# Patient Record
Sex: Male | Born: 1950
Health system: Southern US, Community
[De-identification: ages and names within clinical notes are randomized; demographics above are authoritative.]

## PROBLEM LIST (undated history)

## (undated) DIAGNOSIS — N189 Chronic kidney disease, unspecified: Secondary | ICD-10-CM

## (undated) DIAGNOSIS — E7800 Pure hypercholesterolemia, unspecified: Secondary | ICD-10-CM

## (undated) DIAGNOSIS — C801 Malignant (primary) neoplasm, unspecified: Secondary | ICD-10-CM

## (undated) HISTORY — PX: HEMORRHOID SURGERY: SHX153

## (undated) HISTORY — DX: Chronic kidney disease, unspecified: N18.9

## (undated) HISTORY — DX: Malignant (primary) neoplasm, unspecified: C80.1

---

## 2008-02-20 ENCOUNTER — Ambulatory Visit: Payer: Self-pay | Admitting: Internal Medicine

## 2008-03-05 ENCOUNTER — Ambulatory Visit: Payer: Self-pay | Admitting: Internal Medicine

## 2012-07-09 ENCOUNTER — Telehealth: Payer: Self-pay | Admitting: *Deleted

## 2012-07-09 ENCOUNTER — Other Ambulatory Visit: Payer: Self-pay | Admitting: Physician Assistant

## 2012-07-09 DIAGNOSIS — E785 Hyperlipidemia, unspecified: Secondary | ICD-10-CM

## 2012-07-09 MED ORDER — SIMVASTATIN 40 MG PO TABS: 40.0000 mg | ORAL_TABLET | Freq: Every day | ORAL | Status: DC

## 2012-07-09 NOTE — Telephone Encounter (Signed)
Simva, 1 year, [facility] - done

## 2012-07-09 NOTE — Telephone Encounter (Signed)
Patient was seen recently for a check-up.  He typically gets his Simvastatin for 1 year when he is seen, but this time he only received a 30 day supply with one refill.  Can you please give additional refills?  [facility]

## 2012-07-10 NOTE — Telephone Encounter (Signed)
Patient aware.

## 2012-11-27 ENCOUNTER — Other Ambulatory Visit: Payer: Self-pay | Admitting: Nurse Practitioner

## 2013-01-14 ENCOUNTER — Other Ambulatory Visit: Payer: Self-pay | Admitting: *Deleted

## 2013-01-14 DIAGNOSIS — E785 Hyperlipidemia, unspecified: Secondary | ICD-10-CM

## 2013-01-14 MED ORDER — SIMVASTATIN 40 MG PO TABS: 40.0000 mg | ORAL_TABLET | Freq: Every day | ORAL | Status: DC

## 2013-09-22 ENCOUNTER — Emergency Department (HOSPITAL_COMMUNITY)
Admission: EM | Admit: 2013-09-22 | Discharge: 2013-09-22 | Disposition: A | Discharge status: Home / Self Care | Payer: Worker's Compensation | Attending: Emergency Medicine | Admitting: Emergency Medicine

## 2013-09-22 ENCOUNTER — Encounter (HOSPITAL_COMMUNITY): Payer: Self-pay | Admitting: Emergency Medicine

## 2013-09-22 DIAGNOSIS — S62639B Displaced fracture of distal phalanx of unspecified finger, initial encounter for open fracture: Secondary | ICD-10-CM | POA: Insufficient documentation

## 2013-09-22 DIAGNOSIS — S61219A Laceration without foreign body of unspecified finger without damage to nail, initial encounter: Secondary | ICD-10-CM

## 2013-09-22 DIAGNOSIS — Y9289 Other specified places as the place of occurrence of the external cause: Secondary | ICD-10-CM | POA: Insufficient documentation

## 2013-09-22 DIAGNOSIS — W278XXA Contact with other nonpowered hand tool, initial encounter: Secondary | ICD-10-CM | POA: Insufficient documentation

## 2013-09-22 DIAGNOSIS — Y9389 Activity, other specified: Secondary | ICD-10-CM | POA: Insufficient documentation

## 2013-09-22 DIAGNOSIS — Z79899 Other long term (current) drug therapy: Secondary | ICD-10-CM | POA: Insufficient documentation

## 2013-09-22 DIAGNOSIS — S61209A Unspecified open wound of unspecified finger without damage to nail, initial encounter: Secondary | ICD-10-CM | POA: Insufficient documentation

## 2013-09-22 DIAGNOSIS — T148XXA Other injury of unspecified body region, initial encounter: Secondary | ICD-10-CM

## 2013-09-22 DIAGNOSIS — Y990 Civilian activity done for income or pay: Secondary | ICD-10-CM | POA: Insufficient documentation

## 2013-09-22 HISTORY — DX: Pure hypercholesterolemia, unspecified: E78.00

## 2013-09-22 MED ORDER — OXYCODONE-ACETAMINOPHEN 5-325 MG PO TABS: 1.0000 | ORAL_TABLET | Freq: Four times a day (QID) | ORAL | Status: DC | PRN

## 2013-09-22 MED ORDER — CEFAZOLIN SODIUM 1-5 GM-% IV SOLN
1.0000 g | Freq: Three times a day (TID) | INTRAVENOUS | Status: DC
  Administered 2013-09-22: 1 g via INTRAVENOUS
  Filled 2013-09-22: qty 50

## 2013-09-22 MED ORDER — ONDANSETRON HCL 4 MG PO TABS: 4.0000 mg | ORAL_TABLET | Freq: Four times a day (QID) | ORAL | Status: DC

## 2013-09-22 MED ORDER — MORPHINE SULFATE 4 MG/ML IJ SOLN
4.0000 mg | Freq: Once | INTRAMUSCULAR | Status: AC
  Administered 2013-09-22: 4 mg via INTRAVENOUS
  Filled 2013-09-22: qty 1

## 2013-09-22 MED ORDER — ONDANSETRON HCL 4 MG/2ML IJ SOLN
4.0000 mg | Freq: Once | INTRAMUSCULAR | Status: AC
  Administered 2013-09-22: 4 mg via INTRAVENOUS
  Filled 2013-09-22: qty 2

## 2013-09-22 MED ORDER — CEPHALEXIN 500 MG PO CAPS: ORAL_CAPSULE | ORAL | Status: DC

## 2013-09-22 NOTE — ED Provider Notes (Signed)
CSN: [id number]     Arrival date & time 09/22/13  1600 History  This chart was scribed for non-physician practitioner Cleatrice Burke, PA-C working with Virgel Manifold, MD by Eston Mould, ED Scribe. This patient was seen in room WTR7/WTR7 and the patient's care was started at 4:29 PM .   No chief complaint on file.  The history is provided by the patient. No language interpreter was used.   HPI Comments: Shawn Cline is a 63 y.o. male who presents to the Emergency Department complaining of L 2nd finger injury that occurred today,2 hours ago. Pt states while at work, he was using a table saw and had finger cut by saw. The injury occurred around 2PM. There is a burning pain. Was seen by PCP, had an X-ray done and was informed he had a laceration down to the bone. Reports taking 1 Asprin daily (last taken: last night); denies other blood thinner use. Last Tetanus shot:2-3 years ago.  No past medical history on file. No past surgical history on file. No family history on file. History  Substance Use Topics  . Smoking status: Not on file  . Smokeless tobacco: Not on file  . Alcohol Use: Not on file    Review of Systems  Skin: Positive for wound.  Neurological: Negative for weakness and numbness.  Hematological: Does not bruise/bleed easily.  All other systems reviewed and are negative.  Allergies  Review of patient's allergies indicates not on file.  Home Medications   Prior to Admission medications   Medication Sig Start Date End Date Taking? Authorizing Provider  simvastatin (ZOCOR) 40 MG tablet Take 1 tablet (40 mg total) by mouth at bedtime. 01/14/13   Chipper Herb, MD   BP 141/88  Pulse 78  Temp(Src) 98.6 F (37 C) (Oral)  Resp 16  SpO2 98%  Physical Exam  Nursing note and vitals reviewed. Constitutional: He is oriented to person, place, and time. He appears well-developed and well-nourished. No distress.  HENT:  Head: Normocephalic and atraumatic.  Right  Ear: External ear normal.  Left Ear: External ear normal.  Nose: Nose normal.  Eyes: Conjunctivae and EOM are normal.  Neck: Normal range of motion. Neck supple. No tracheal deviation present.  Cardiovascular: Normal rate, regular rhythm and normal heart sounds.   Pulmonary/Chest: Effort normal and breath sounds normal. No stridor. No respiratory distress.  Abdominal: Soft. He exhibits no distension. There is no tenderness.  Musculoskeletal: Normal range of motion.  Laceration to L distal 2nd finger. Nail plate involvement, nailbed intact. Compartment soft. Sensation is intact. Flexion and extension intact.   Neurological: He is alert and oriented to person, place, and time.  Sensation intact.  Skin: Skin is warm and dry. He is not diaphoretic.  Psychiatric: He has a normal mood and affect. His behavior is normal.   ED Course  Procedures DIAGNOSTIC STUDIES: Oxygen Saturation is 98% on RA, normal by my interpretation.    COORDINATION OF CARE: 4:33 PM-Discussed treatment plan which includes contact on-call hand surgeon, IV abx, and 2-mg of morphine. Pt agreed to plan.   4:50 PM-Spoke with Dr. Grandville Silos regarding patients case: will review X-rays and return phone call with a plan.  4: 86 PM - Dr. Grandville Silos states this fracture is too small to be fixed surgically and finger may be closed and tissue re approximated. F/u with him in the office.  Labs Review Labs Reviewed - No data to display  Imaging Review No results found.   EKG  Interpretation None     MDM   Final diagnoses:  Finger laceration  Avulsion fracture   Tdap booster UTD. Patient with an avulsion fracture to distal tip of 2nd finger on left. This is an open fracture. IV Ancef given in Ed. Patient will be discharged home with Keflex. Wound cleaning complete with pressure irrigation, bottom of wound visualized, no foreign bodies appreciated. Laceration occurred < 8 hours prior to repair which was well tolerated. Discussed  suture home care w pt and answered questions. Pt to f-u for wound check with Dr. Grandville Silos. Pt is hemodynamically stable w no complaints prior to dc. Dr. Wilson Singer evaluated patient and agrees with plan.   I personally performed the services described in this documentation, which was scribed in my presence. The recorded information has been reviewed and is accurate.    Elwyn Lade, PA-C 09/22/13 2020

## 2013-09-22 NOTE — ED Notes (Signed)
Pt seen at [facility] today for chainsaw accident at 39 today in which pt cut left index finger. PCP took xray, states pt cut into bone, sent CD of film. Pt states last tetanus shot was 3 years ago.

## 2013-09-22 NOTE — Discharge Instructions (Signed)
Finger Avulsion  When the tip of the finger is lost, a new nail may grow back if part of the fingernail is left. The new nail may be deformed. If just the tip of the finger is lost, no repair may be needed unless there is bone showing. If bone is showing, your caregiver may need to remove the protruding bone and put on a bandage. Your caregiver will do what is best for you. Most of the time when a fingertip is lost, the end will gradually grow back on and look fairly normal, but it may remain sensitive to pressure and temperature extremes for a long time. HOME CARE INSTRUCTIONS   Keep your hand elevated above your heart to relieve pain and swelling.  Keep your dressing dry and clean.  Change your bandage in 24 hours or as directed.  Only take over-the-counter or prescription medicines for pain, discomfort, or fever as directed by your caregiver.  See your caregiver as needed for problems. SEEK MEDICAL CARE IF:   You have increased pain, swelling, drainage, or bleeding.  You have a fever.  You have swelling that spreads from your finger and into your hand. Make sure to check to see if you need a tetanus booster. Document Released: 06/11/2001 Document Revised: 10/02/2011 Document Reviewed: 05/06/2008 [facility] Patient Information 2014 [place], [place].  Laceration Care, Adult A laceration is a cut or lesion that goes through all layers of the skin and into the tissue just beneath the skin. TREATMENT  Some lacerations may not require closure. Some lacerations may not be able to be closed due to an increased risk of infection. It is important to see your caregiver as soon as possible after an injury to minimize the risk of infection and maximize the opportunity for successful closure. If closure is appropriate, pain medicines may be given, if needed. The wound will be cleaned to help prevent infection. Your caregiver will use stitches (sutures), staples, wound glue (adhesive), or skin adhesive  strips to repair the laceration. These tools bring the skin edges together to allow for faster healing and a better cosmetic outcome. However, all wounds will heal with a scar. Once the wound has healed, scarring can be minimized by covering the wound with sunscreen during the day for 1 full year. HOME CARE INSTRUCTIONS  For sutures or staples:  Keep the wound clean and dry.  If you were given a bandage (dressing), you should change it at least once a day. Also, change the dressing if it becomes wet or dirty, or as directed by your caregiver.  Wash the wound with soap and water 2 times a day. Rinse the wound off with water to remove all soap. Pat the wound dry with a clean towel.  After cleaning, apply a thin layer of the antibiotic ointment as recommended by your caregiver. This will help prevent infection and keep the dressing from sticking.  You may shower as usual after the first 24 hours. Do not soak the wound in water until the sutures are removed.  Only take over-the-counter or prescription medicines for pain, discomfort, or fever as directed by your caregiver.  Get your sutures or staples removed as directed by your caregiver. For skin adhesive strips:  Keep the wound clean and dry.  Do not get the skin adhesive strips wet. You may bathe carefully, using caution to keep the wound dry.  If the wound gets wet, pat it dry with a clean towel.  Skin adhesive strips will fall off  on their own. You may trim the strips as the wound heals. Do not remove skin adhesive strips that are still stuck to the wound. They will fall off in time. For wound adhesive:  You may briefly wet your wound in the shower or bath. Do not soak or scrub the wound. Do not swim. Avoid periods of heavy perspiration until the skin adhesive has fallen off on its own. After showering or bathing, gently pat the wound dry with a clean towel.  Do not apply liquid medicine, cream medicine, or ointment medicine to your  wound while the skin adhesive is in place. This may loosen the film before your wound is healed.  If a dressing is placed over the wound, be careful not to apply tape directly over the skin adhesive. This may cause the adhesive to be pulled off before the wound is healed.  Avoid prolonged exposure to sunlight or tanning lamps while the skin adhesive is in place. Exposure to ultraviolet light in the first year will darken the scar.  The skin adhesive will usually remain in place for 5 to 10 days, then naturally fall off the skin. Do not pick at the adhesive film. You may need a tetanus shot if:  You cannot remember when you had your last tetanus shot.  You have never had a tetanus shot. If you get a tetanus shot, your arm may swell, get red, and feel warm to the touch. This is common and not a problem. If you need a tetanus shot and you choose not to have one, there is a rare chance of getting tetanus. Sickness from tetanus can be serious. SEEK MEDICAL CARE IF:   You have redness, swelling, or increasing pain in the wound.  You see a red line that goes away from the wound.  You have yellowish-white fluid (pus) coming from the wound.  You have a fever.  You notice a bad smell coming from the wound or dressing.  Your wound breaks open before or after sutures have been removed.  You notice something coming out of the wound such as wood or glass.  Your wound is on your hand or foot and you cannot move a finger or toe. SEEK IMMEDIATE MEDICAL CARE IF:   Your pain is not controlled with prescribed medicine.  You have severe swelling around the wound causing pain and numbness or a change in color in your arm, hand, leg, or foot.  Your wound splits open and starts bleeding.  You have worsening numbness, weakness, or loss of function of any joint around or beyond the wound.  You develop painful lumps near the wound or on the skin anywhere on your body. MAKE SURE YOU:   Understand  these instructions.  Will watch your condition.  Will get help right away if you are not doing well or get worse. Document Released: 04/02/2005 Document Revised: 06/25/2011 Document Reviewed: 09/26/2010 [facility] Patient Information 2014 [place], [place].

## 2013-09-22 NOTE — ED Notes (Signed)
Ortho called for finger splint.

## 2013-09-23 ENCOUNTER — Other Ambulatory Visit: Payer: Self-pay | Admitting: Family Medicine

## 2013-09-27 NOTE — ED Provider Notes (Signed)
Medical screening examination/treatment/procedure(s) were conducted as a shared visit with non-physician practitioner(s) and myself.  I personally evaluated the patient during the encounter.   EKG Interpretation None     63yM with L index laceration from table saw. Tuft fractures. Minimal soft tissue loss. Able to adequately close. Abx. PRN pain meds. Hand surgery FU.   Virgel Manifold, MD 09/27/13 [phone]

## 2013-11-23 ENCOUNTER — Encounter: Payer: Self-pay | Admitting: Internal Medicine

## 2013-12-02 ENCOUNTER — Encounter: Payer: Self-pay | Admitting: Family

## 2013-12-02 ENCOUNTER — Ambulatory Visit (INDEPENDENT_AMBULATORY_CARE_PROVIDER_SITE_OTHER): Payer: BC Managed Care – PPO | Admitting: Family

## 2013-12-02 VITALS — BP 127/81 | HR 79 | Temp 98.1°F | Ht 66.0 in | Wt 175.0 lb

## 2013-12-02 DIAGNOSIS — H6120 Impacted cerumen, unspecified ear: Secondary | ICD-10-CM

## 2013-12-02 DIAGNOSIS — H6123 Impacted cerumen, bilateral: Secondary | ICD-10-CM

## 2013-12-02 NOTE — Progress Notes (Signed)
   Subjective:    Patient ID: Shawn Cline, male    DOB: 1951/04/01, 63 y.o.   MRN: [id number]  Ear Fullness  There is pain in both ears. This is a recurrent problem. The current episode started yesterday. The problem occurs constantly. The problem has been gradually worsening. There has been no fever. The pain is at a severity of 2/10. The pain is mild. Associated symptoms include hearing loss. Pertinent negatives include no coughing, diarrhea, drainage, ear discharge, rhinorrhea or sore throat. He has tried nothing for the symptoms. The treatment provided no relief.      Review of Systems  Constitutional: Negative.   HENT: Positive for hearing loss. Negative for ear discharge, rhinorrhea and sore throat.   Respiratory: Negative.  Negative for cough.   Cardiovascular: Negative.   Gastrointestinal: Negative.  Negative for diarrhea.  Endocrine: Negative.   Genitourinary: Negative.   Musculoskeletal: Negative.   Neurological: Negative.   Hematological: Negative.   Psychiatric/Behavioral: Negative.   All other systems reviewed and are negative.      Objective:   Physical Exam  Vitals reviewed. Constitutional: He is oriented to person, place, and time. He appears well-developed and well-nourished. No distress.  HENT:  Head: Normocephalic.  Mouth/Throat: Oropharynx is clear and moist.  Bilateral ears impacted with cerumen   Eyes: Pupils are equal, round, and reactive to light. Right eye exhibits no discharge. Left eye exhibits no discharge.  Neck: Normal range of motion. Neck supple. No thyromegaly present.  Cardiovascular: Normal rate, regular rhythm, normal heart sounds and intact distal pulses.   No murmur heard. Pulmonary/Chest: Effort normal and breath sounds normal. No respiratory distress. He has no wheezes.  Abdominal: Soft. Bowel sounds are normal. He exhibits no distension. There is no tenderness.  Musculoskeletal: Normal range of motion. He exhibits no edema and no  tenderness.  Neurological: He is alert and oriented to person, place, and time. He has normal reflexes. No cranial nerve deficit.  Skin: Skin is warm and dry. No rash noted. No erythema.  Psychiatric: He has a normal mood and affect. His behavior is normal. Judgment and thought content normal.   Bilateral ears washed out- Canal mildly irritated and TM WNL   BP 127/81  Pulse 79  Temp(Src) 98.1 F (36.7 C) (Oral)  Ht 5\' 6"  (1.676 m)  Wt 175 lb (79.379 kg)  BMI 28.26 kg/m2     Assessment & Plan:  1. Cerumen impaction, bilateral -keep ears clean and dry -May need to use ear wax drop OTC to prevent build up -RTO prn  Evelina Dun, FNP

## 2013-12-02 NOTE — Patient Instructions (Signed)
Cerumen Impaction °A cerumen impaction is when the wax in your ear forms a plug. This plug usually causes reduced hearing. Sometimes it also causes an earache or dizziness. Removing a cerumen impaction can be difficult and painful. The wax sticks to the ear canal. The canal is sensitive and bleeds easily. If you try to remove a heavy wax buildup with a cotton tipped swab, you may push it in further. °Irrigation with water, suction, and small ear curettes may be used to clear out the wax. If the impaction is fixed to the skin in the ear canal, ear drops may be needed for a few days to loosen the wax. People who build up a lot of wax frequently can use ear wax removal products available in your local drugstore. °SEEK MEDICAL CARE IF:  °You develop an earache, increased hearing loss, or marked dizziness. °Document Released: 05/10/2004 Document Revised: 06/25/2011 Document Reviewed: 06/30/2009 °[employer]® Patient Information ©2015 [employer]. This information is not intended to replace advice given to you by your health care provider. Make sure you discuss any questions you have with your health care provider. ° °

## 2013-12-29 ENCOUNTER — Other Ambulatory Visit: Payer: Self-pay | Admitting: Nurse Practitioner

## 2014-03-05 ENCOUNTER — Telehealth: Payer: Self-pay | Admitting: Family Medicine

## 2014-03-06 NOTE — Telephone Encounter (Signed)
Appointment scheduled for 04/13/2014 with Ronnald Collum for physical

## 2014-04-12 ENCOUNTER — Other Ambulatory Visit: Payer: Self-pay | Admitting: Nurse Practitioner

## 2014-04-13 ENCOUNTER — Ambulatory Visit (INDEPENDENT_AMBULATORY_CARE_PROVIDER_SITE_OTHER): Payer: BC Managed Care – PPO | Admitting: Nurse Practitioner

## 2014-04-13 ENCOUNTER — Encounter: Payer: Self-pay | Admitting: Nurse Practitioner

## 2014-04-13 ENCOUNTER — Ambulatory Visit (INDEPENDENT_AMBULATORY_CARE_PROVIDER_SITE_OTHER): Payer: BC Managed Care – PPO

## 2014-04-13 VITALS — BP 124/82 | HR 70 | Temp 96.7°F | Ht 66.0 in | Wt 182.2 lb

## 2014-04-13 DIAGNOSIS — J209 Acute bronchitis, unspecified: Secondary | ICD-10-CM

## 2014-04-13 DIAGNOSIS — Z0000 Encounter for general adult medical examination without abnormal findings: Secondary | ICD-10-CM

## 2014-04-13 LAB — POCT CBC
Granulocyte percent: 62.7 %G (ref 37–80)
HCT, POC: 43.5 % (ref 43.5–53.7)
Hemoglobin: 13.9 g/dL — AB (ref 14.1–18.1)
Lymph, poc: 1.5 (ref 0.6–3.4)
MCH: 29.2 pg (ref 27–31.2)
MCHC: 31.8 g/dL (ref 31.8–35.4)
MCV: 91.6 fL (ref 80–97)
MPV: 7.7 fL (ref 0–99.8)
POC Granulocyte: 3.4 (ref 2–6.9)
POC LYMPH PERCENT: 27.2 %L (ref 10–50)
Platelet Count, POC: 149 10*3/uL (ref 142–424)
RBC: 4.8 M/uL (ref 4.69–6.13)
RDW, POC: 12.9 %
WBC: 5.5 10*3/uL (ref 4.6–10.2)

## 2014-04-13 MED ORDER — HYDROCODONE-HOMATROPINE 5-1.5 MG/5ML PO SYRP: 5.0000 mL | ORAL_SOLUTION | Freq: Three times a day (TID) | ORAL | Status: DC | PRN

## 2014-04-13 MED ORDER — AZITHROMYCIN 250 MG PO TABS: ORAL_TABLET | ORAL | Status: DC

## 2014-04-13 NOTE — Progress Notes (Signed)
Subjective:    Patient ID: Shawn Cline, male    DOB: Jan 13, 1951, 63 y.o.   MRN: [id number]  HPI Patient in today for annual physical exam- He has no current medical problems and is on no current meds- His only complaint today is clod like symptoms that started 3 days ago.    Review of Systems  Constitutional: Positive for fever. Negative for chills and appetite change.  HENT: Positive for congestion and rhinorrhea. Negative for sore throat, trouble swallowing and voice change.   Respiratory: Positive for cough.   Cardiovascular: Negative.   Gastrointestinal: Negative.   Genitourinary: Negative.   Neurological: Negative.  Negative for numbness.  Psychiatric/Behavioral: Negative.   All other systems reviewed and are negative.      Objective:   Physical Exam  Constitutional: He is oriented to person, place, and time. He appears well-developed and well-nourished.  HENT:  Head: Normocephalic.  Right Ear: External ear normal.  Left Ear: External ear normal.  Nose: Nose normal.  Mouth/Throat: Oropharynx is clear and moist.  Eyes: EOM are normal. Pupils are equal, round, and reactive to light.  Neck: Normal range of motion. Neck supple. No JVD present. No thyromegaly present.  Cardiovascular: Normal rate, regular rhythm, normal heart sounds and intact distal pulses.  Exam reveals no gallop and no friction rub.   No murmur heard. Pulmonary/Chest: Effort normal. No respiratory distress. He has no wheezes. He has no rales. He exhibits no tenderness.  Deep tight cough with ronchi in upper lobes  Abdominal: Soft. Bowel sounds are normal. He exhibits no mass. There is no tenderness.  Genitourinary: Prostate normal and penis normal.  Musculoskeletal: Normal range of motion. He exhibits no edema.  Lymphadenopathy:    He has no cervical adenopathy.  Neurological: He is alert and oriented to person, place, and time. No cranial nerve deficit.  Skin: Skin is warm and dry.  Psychiatric:  He has a normal mood and affect. His behavior is normal. Judgment and thought content normal.   BP 124/82 mmHg  Pulse 70  Temp(Src) 96.7 F (35.9 C) (Oral)  Ht 5\' 6"  (1.676 m)  Wt 182 lb 4 oz (82.668 kg)  BMI 29.43 kg/m2  EKG-NSR-Mary-Margaret Branton Einstein, FNP  Chest x ray- mild bronchitic changes- hiatal hernia-Preliminary reading by Ronnald Collum, FNP  [facility]      Assessment & Plan:   1. Annual physical exam   2. Acute bronchitis, unspecified organism    Meds ordered this encounter  Medications  . azithromycin (ZITHROMAX Z-PAK) 250 MG tablet    Sig: As directed    Dispense:  6 each    Refill:  0    Order Specific Question:  Supervising Provider    Answer:  Chipper Herb [[id number]]  . HYDROcodone-homatropine (HYCODAN) 5-1.5 MG/5ML syrup    Sig: Take 5 mLs by mouth every 8 (eight) hours as needed for cough.    Dispense:  120 mL    Refill:  0    Order Specific Question:  Supervising Provider    Answer:  Chipper Herb [[id number]]   1. Take meds as prescribed 2. Use a cool mist humidifier especially during the winter months and when heat has been humid. 3. Use saline nose sprays frequently 4. Saline irrigations of the nose can be very helpful if done frequently.  * 4X daily for 1 week*  * Use of a nettie pot can be helpful with this. Follow directions with this* 5. Drink plenty of fluids 6. Keep  thermostat turn down low 7.For any cough or congestion  Use plain Mucinex- regular strength or max strength is fine   * Children- consult with Pharmacist for dosing 8. For fever or aces or pains- take tylenol or ibuprofen appropriate for age and weight.  * for fevers greater than 101 orally you may alternate ibuprofen and tylenol every  3 hours.   Health maintenance reviewed RTO in 2 weeks for flu shot Labs pending Diet and exercise encouraged  Mary-Margaret Hassell Done, FNP

## 2014-04-13 NOTE — Patient Instructions (Signed)

## 2014-04-14 LAB — CMP14+EGFR
ALT: 16 IU/L (ref 0–44)
AST: 20 IU/L (ref 0–40)
Albumin/Globulin Ratio: 1.4 (ref 1.1–2.5)
Albumin: 4.2 g/dL (ref 3.6–4.8)
Alkaline Phosphatase: 60 IU/L (ref 39–117)
BUN/Creatinine Ratio: 13 (ref 10–22)
BUN: 18 mg/dL (ref 8–27)
CALCIUM: 8.7 mg/dL (ref 8.6–10.2)
CHLORIDE: 102 mmol/L (ref 97–108)
CO2: 24 mmol/L (ref 18–29)
Creatinine, Ser: 1.4 mg/dL — ABNORMAL HIGH (ref 0.76–1.27)
GFR calc Af Amer: 62 mL/min/{1.73_m2} (ref >59)
GFR, EST NON AFRICAN AMERICAN: 53 mL/min/{1.73_m2} — AB (ref >59)
Globulin, Total: 3.1 g/dL (ref 1.5–4.5)
Glucose: 106 mg/dL — ABNORMAL HIGH (ref 65–99)
POTASSIUM: 3.8 mmol/L (ref 3.5–5.2)
Sodium: 139 mmol/L (ref 134–144)
TOTAL PROTEIN: 7.3 g/dL (ref 6.0–8.5)
Total Bilirubin: 0.4 mg/dL (ref 0.0–1.2)

## 2014-04-14 LAB — PSA, TOTAL AND FREE
PSA, Free Pct: 44 %
PSA, Free: 0.22 ng/mL
PSA: 0.5 ng/mL (ref 0.0–4.0)

## 2014-04-14 LAB — NMR, LIPOPROFILE
Cholesterol: 172 mg/dL (ref 100–199)
HDL Cholesterol by NMR: 41 mg/dL (ref >39)
HDL Particle Number: 19.1 umol/L — ABNORMAL LOW (ref >=30.5)
LDL PARTICLE NUMBER: 1574 nmol/L — AB (ref <1000)
LDL SIZE: 21 nm (ref >20.5)
LDL-C: 112 mg/dL — ABNORMAL HIGH (ref 0–99)
LP-IR SCORE: 40 (ref <=45)
Small LDL Particle Number: 758 nmol/L — ABNORMAL HIGH (ref <=527)
Triglycerides by NMR: 93 mg/dL (ref 0–149)

## 2014-04-19 ENCOUNTER — Telehealth: Payer: Self-pay | Admitting: Nurse Practitioner

## 2014-04-19 DIAGNOSIS — E785 Hyperlipidemia, unspecified: Secondary | ICD-10-CM

## 2014-04-19 MED ORDER — SIMVASTATIN 40 MG PO TABS: 40.0000 mg | ORAL_TABLET | Freq: Every day | ORAL | Status: DC

## 2014-04-19 NOTE — Telephone Encounter (Signed)
Script for cholesterol medication wasn't filled at last appointment and he had been out for awhile.

## 2014-06-01 ENCOUNTER — Ambulatory Visit (INDEPENDENT_AMBULATORY_CARE_PROVIDER_SITE_OTHER): Payer: BLUE CROSS/BLUE SHIELD | Admitting: *Deleted

## 2014-06-01 DIAGNOSIS — Z23 Encounter for immunization: Secondary | ICD-10-CM

## 2014-06-17 ENCOUNTER — Ambulatory Visit: Payer: BLUE CROSS/BLUE SHIELD | Admitting: Nurse Practitioner

## 2014-10-29 ENCOUNTER — Telehealth: Payer: Self-pay | Admitting: Nurse Practitioner

## 2014-10-29 NOTE — Telephone Encounter (Signed)
Needs to be seen for labs before refilling cholesterol meds

## 2014-10-29 NOTE — Telephone Encounter (Signed)
appt scheduled Pt notified 

## 2014-11-03 ENCOUNTER — Ambulatory Visit (INDEPENDENT_AMBULATORY_CARE_PROVIDER_SITE_OTHER): Payer: BLUE CROSS/BLUE SHIELD | Admitting: Family Medicine

## 2014-11-03 ENCOUNTER — Other Ambulatory Visit: Payer: Self-pay | Admitting: *Deleted

## 2014-11-03 ENCOUNTER — Encounter: Payer: Self-pay | Admitting: Family Medicine

## 2014-11-03 VITALS — BP 135/86 | HR 76 | Temp 97.7°F | Ht 66.0 in | Wt 175.8 lb

## 2014-11-03 DIAGNOSIS — H6123 Impacted cerumen, bilateral: Secondary | ICD-10-CM | POA: Diagnosis not present

## 2014-11-03 DIAGNOSIS — E785 Hyperlipidemia, unspecified: Secondary | ICD-10-CM

## 2014-11-03 MED ORDER — SIMVASTATIN 40 MG PO TABS: 40.0000 mg | ORAL_TABLET | Freq: Every day | ORAL | Status: DC

## 2014-11-03 NOTE — Progress Notes (Signed)
   Subjective:  Patient ID: Shawn Cline, male    DOB: 1950-07-14  Age: 64 y.o. MRN: [id number]  CC: Ear Fullness   HPI Shawn Cline presents for 2-3 weeks of increasing pressure in ears. Has to have lavage about once a year because he makes a lot of wax and has small ear canals. HEaring is diminished currently, but able to communicate adequately.  Shawn Cline has a past medical Shawn of Hypercholesteremia.   He has past surgical Shawn that includes Hemorrhoid surgery.   His family Shawn includes Atrial fibrillation in his mother; Cancer in his father.He reports that he quit smoking about 22 years ago. He has never used smokeless tobacco. He reports that he does not drink alcohol or use illicit drugs.  Current Outpatient Prescriptions on File Prior to Visit  Medication Sig Dispense Refill  . aspirin EC 81 MG tablet Take 81 mg by mouth daily.    . cholecalciferol (VITAMIN D) 1000 UNITS tablet Take 1,000 Units by mouth daily.    Marland Kitchen ibuprofen (ADVIL,MOTRIN) 200 MG tablet Take 600 mg by mouth every 6 (six) hours as needed for moderate pain.    . simvastatin (ZOCOR) 40 MG tablet Take 1 tablet (40 mg total) by mouth at bedtime. 90 tablet 1   No current facility-administered medications on file prior to visit.    ROS Review of Systems  Constitutional: Negative for fever, chills, diaphoresis and activity change.  HENT: Positive for ear pain and hearing loss. Negative for congestion, ear discharge, facial swelling, nosebleeds, rhinorrhea, sinus pressure and sneezing.   Respiratory: Negative for apnea, cough and shortness of breath.   Cardiovascular: Negative.   Allergic/Immunologic: Negative.     Objective:  BP 135/86 mmHg  Pulse 76  Temp(Src) 97.7 F (36.5 C) (Oral)  Ht 5\' 6"  (1.676 m)  Wt 175 lb 12.8 oz (79.742 kg)  BMI 28.39 kg/m2  Physical Exam  Constitutional: He appears well-developed and well-nourished. No distress.  HENT:  Head: Normocephalic.  Bilateral  ear canals occluded by cerumen on first evaluation. Subsequently lavaged performed with no difficulty and the wax was removed completely revealing normal tympanum bilaterally as well as normal external ear canals.  Eyes: Conjunctivae are normal. Pupils are equal, round, and reactive to light.  Neck: Normal range of motion. Neck supple.  Cardiovascular: Normal rate and regular rhythm.   No murmur heard. Pulmonary/Chest: Effort normal and breath sounds normal.    Assessment & Plan:   Shawn Cline was seen today for ear fullness.  Diagnoses and all orders for this visit:  Cerumen impaction, bilateral Orders: -     Ear wax removal   I have discontinued Shawn Cline azithromycin and HYDROcodone-homatropine. I am also having him maintain his ibuprofen, aspirin EC, cholecalciferol, and simvastatin.  No orders of the defined types were placed in this encounter.     Follow-up: Return if symptoms worsen or fail to improve, for CPE.  Claretta Fraise, M.D.

## 2014-11-04 ENCOUNTER — Ambulatory Visit: Payer: BLUE CROSS/BLUE SHIELD | Admitting: Nurse Practitioner

## 2015-01-25 ENCOUNTER — Encounter: Payer: Self-pay | Admitting: Family Medicine

## 2015-01-25 ENCOUNTER — Ambulatory Visit (INDEPENDENT_AMBULATORY_CARE_PROVIDER_SITE_OTHER): Payer: BLUE CROSS/BLUE SHIELD | Admitting: Family Medicine

## 2015-01-25 VITALS — BP 133/70 | HR 73 | Temp 98.2°F | Ht 66.0 in | Wt 172.0 lb

## 2015-01-25 DIAGNOSIS — K122 Cellulitis and abscess of mouth: Secondary | ICD-10-CM | POA: Diagnosis not present

## 2015-01-25 MED ORDER — NYSTATIN 100000 UNIT/ML MT SUSP: OROMUCOSAL | Status: DC

## 2015-01-25 MED ORDER — NYSTATIN 100000 UNIT/ML MT SUSP: 5.0000 mL | Freq: Four times a day (QID) | OROMUCOSAL | Status: DC

## 2015-01-25 MED ORDER — AMOXICILLIN 500 MG PO CAPS: 500.0000 mg | ORAL_CAPSULE | Freq: Three times a day (TID) | ORAL | Status: DC

## 2015-01-25 MED ORDER — METHYLPREDNISOLONE ACETATE 80 MG/ML IJ SUSP
60.0000 mg | Freq: Once | INTRAMUSCULAR | Status: AC
  Administered 2015-01-25: 60 mg via INTRAMUSCULAR

## 2015-01-25 NOTE — Progress Notes (Signed)
Subjective:    Patient ID: Biff Rutigliano, male    DOB: 01/12/1951, 64 y.o.   MRN: [id number]  HPI  64 year old male comes in today with complaint that uvula is enlarged. This started about 2 days ago. Prior to this development he had a cough and sore throat that have since resolved. Cold sore throat were actually a couple of weeks ago. He takes ibuprofen for shoulder pain and has been taking this for years as well as his simvastatin. He doesn't recall anything in particular that happened on Sunday when this swelling started.    There are no active problems to display for this patient.  Outpatient Encounter Prescriptions as of 01/25/2015  Medication Sig  . aspirin EC 81 MG tablet Take 81 mg by mouth daily.  . cholecalciferol (VITAMIN D) 1000 UNITS tablet Take 1,000 Units by mouth daily.  Marland Kitchen ibuprofen (ADVIL,MOTRIN) 200 MG tablet Take 600 mg by mouth every 6 (six) hours as needed for moderate pain.  . simvastatin (ZOCOR) 40 MG tablet Take 1 tablet (40 mg total) by mouth at bedtime.   No facility-administered encounter medications on file as of 01/25/2015.       Review of Systems  Constitutional: Negative.   HENT:       Enlarged uvula  Eyes: Negative.   Respiratory: Positive for cough (Mild cough).   Cardiovascular: Negative.   Gastrointestinal: Negative.   Endocrine: Negative.   Genitourinary: Negative.   Musculoskeletal: Negative.   Skin: Negative.   Allergic/Immunologic: Negative.   Neurological: Negative.   Hematological: Negative.   Psychiatric/Behavioral: Negative.        Objective:   Physical Exam  Constitutional: He is oriented to person, place, and time. He appears well-developed and well-nourished. No distress.  HENT:  Head: Normocephalic and atraumatic.  Right Ear: External ear normal.  Left Ear: External ear normal.  Mouth/Throat: No oropharyngeal exudate.  There is some nasal congestion bilaterally. The uvula is elongated and erythematous with a white  linear streak on each side of it.  Eyes: Conjunctivae and EOM are normal. Pupils are equal, round, and reactive to light. Right eye exhibits no discharge. Left eye exhibits no discharge. No scleral icterus.  Neck: Normal range of motion. Neck supple. No thyromegaly present.  There is a small adenopathy but nontender to palpation  Cardiovascular: Normal rate, regular rhythm and normal heart sounds.   No murmur heard. Pulmonary/Chest: Effort normal and breath sounds normal. He has no wheezes. He has no rales.  Musculoskeletal: Normal range of motion.  Lymphadenopathy:    He has cervical adenopathy.  Neurological: He is alert and oriented to person, place, and time.  Skin: Skin is warm and dry. No rash noted.  Psychiatric: He has a normal mood and affect. His behavior is normal. Judgment and thought content normal.  Nursing note and vitals reviewed.   BP 133/70 mmHg  Pulse 73  Temp(Src) 98.2 F (36.8 C) (Oral)  Ht 5\' 6"  (1.676 m)  Wt 172 lb (78.019 kg)  BMI 27.77 kg/m2       Assessment & Plan:  1. Uvulitis - amoxicillin (AMOXIL) 500 MG capsule; Take 1 capsule (500 mg total) by mouth 3 (three) times daily.  Dispense: 30 capsule; Refill: 0 - methylPREDNISolone acetate (DEPO-MEDROL) injection 60 mg; Inject 0.75 mLs (60 mg total) into the muscle once. - nystatin (MYCOSTATIN) 100000 UNIT/ML suspension; Swish and swallow 1 teaspoon 4 times daily after meals and at bedtime  Dispense: 200 mL; Refill: 0  Patient Instructions  Swish and swallow 1 teaspoon Mycostatin suspension after meals and at bedtime Take antibiotic 3 times daily as directed Drink cold fluids If taking ibuprofen or Advil make sure that this is washed down with a large glass of water and after eating--- take as little as possible the next few days   Arrie Senate MD

## 2015-01-25 NOTE — Patient Instructions (Signed)
Swish and swallow 1 teaspoon Mycostatin suspension after meals and at bedtime Take antibiotic 3 times daily as directed Drink cold fluids If taking ibuprofen or Advil make sure that this is washed down with a large glass of water and after eating--- take as little as possible the next few days

## 2015-01-28 ENCOUNTER — Telehealth: Payer: Self-pay | Admitting: Family Medicine

## 2015-01-28 NOTE — Telephone Encounter (Signed)
Please have patient continue current treatment and let us recheck him again about Tuesday next week.

## 2015-01-28 NOTE — Telephone Encounter (Signed)
appt made for Tuesday  2:00 pm given

## 2015-02-01 ENCOUNTER — Ambulatory Visit (INDEPENDENT_AMBULATORY_CARE_PROVIDER_SITE_OTHER): Payer: BLUE CROSS/BLUE SHIELD | Admitting: Family Medicine

## 2015-02-01 ENCOUNTER — Encounter: Payer: Self-pay | Admitting: Family Medicine

## 2015-02-01 VITALS — BP 124/83 | HR 79 | Temp 97.2°F | Ht 66.0 in | Wt 173.0 lb

## 2015-02-01 DIAGNOSIS — E785 Hyperlipidemia, unspecified: Secondary | ICD-10-CM | POA: Diagnosis not present

## 2015-02-01 DIAGNOSIS — K122 Cellulitis and abscess of mouth: Secondary | ICD-10-CM | POA: Diagnosis not present

## 2015-02-01 DIAGNOSIS — E1169 Type 2 diabetes mellitus with other specified complication: Secondary | ICD-10-CM | POA: Insufficient documentation

## 2015-02-01 NOTE — Progress Notes (Signed)
   Subjective:    Patient ID: Shawn Cline, male    DOB: [birth year], 64 y.o.   MRN: [id number]  HPI Pt here for follow up of uvulitis. He is feeling better and is still taking the antibiotic. The patient is much improved with his uvula and the swelling that was occurring with this. He still has a few more antibiotics to take. He has finished the Mycostatin.      There are no active problems to display for this patient.  Outpatient Encounter Prescriptions as of 02/01/2015  Medication Sig  . amoxicillin (AMOXIL) 500 MG capsule Take 1 capsule (500 mg total) by mouth 3 (three) times daily.  Marland Kitchen aspirin EC 81 MG tablet Take 81 mg by mouth daily.  . cholecalciferol (VITAMIN D) 1000 UNITS tablet Take 1,000 Units by mouth daily.  Marland Kitchen ibuprofen (ADVIL,MOTRIN) 200 MG tablet Take 600 mg by mouth every 6 (six) hours as needed for moderate pain.  Marland Kitchen nystatin (MYCOSTATIN) 100000 UNIT/ML suspension Swish and swallow 1 teaspoon 4 times daily after meals and at bedtime  . simvastatin (ZOCOR) 40 MG tablet Take 1 tablet (40 mg total) by mouth at bedtime.   No facility-administered encounter medications on file as of 02/01/2015.      Review of Systems  Constitutional: Negative.   HENT: Negative.   Eyes: Negative.   Respiratory: Positive for cough.   Cardiovascular: Negative.   Gastrointestinal: Negative.   Endocrine: Negative.   Genitourinary: Negative.   Musculoskeletal: Negative.   Skin: Negative.   Allergic/Immunologic: Negative.   Neurological: Negative.   Hematological: Negative.   Psychiatric/Behavioral: Negative.        Objective:   Physical Exam  Constitutional: He is oriented to person, place, and time. He appears well-developed and well-nourished. No distress.  HENT:  Head: Normocephalic and atraumatic.  Mouth/Throat: Oropharynx is clear and moist. No oropharyngeal exudate.  The uvula is much less swollen and actually appears normal today.  Eyes: Conjunctivae and EOM are normal.  Pupils are equal, round, and reactive to light. Right eye exhibits no discharge. Left eye exhibits no discharge.  Neck: Normal range of motion. Neck supple. No thyromegaly present.  Musculoskeletal: Normal range of motion.  Neurological: He is alert and oriented to person, place, and time.  Skin: Skin is warm and dry. No rash noted.  Psychiatric: He has a normal mood and affect. His behavior is normal. Judgment and thought content normal.  Nursing note and vitals reviewed.  BP 124/83 mmHg  Pulse 79  Temp(Src) 97.2 F (36.2 C) (Oral)  Ht 5\' 6"  (1.676 m)  Wt 173 lb (78.472 kg)  BMI 27.94 kg/m2        Assessment & Plan:  1. Hyperlipidemia -Continue current treatment and follow-up with provider for labwork as planned  2. Uvulitis -Finish antibiotic -Return to clinic for fluid in one week  Patient Instructions  Wait 1 week and return for flu shot Finish antibiotic If any further problems with swelling of the uvula please get back in touch with [place]   Arrie Senate MD

## 2015-02-01 NOTE — Patient Instructions (Signed)
Wait 1 week and return for flu shot Finish antibiotic If any further problems with swelling of the uvula please get back in touch with [place]

## 2015-02-11 ENCOUNTER — Ambulatory Visit (INDEPENDENT_AMBULATORY_CARE_PROVIDER_SITE_OTHER): Payer: BLUE CROSS/BLUE SHIELD

## 2015-02-11 DIAGNOSIS — Z23 Encounter for immunization: Secondary | ICD-10-CM | POA: Diagnosis not present

## 2015-04-28 ENCOUNTER — Ambulatory Visit (INDEPENDENT_AMBULATORY_CARE_PROVIDER_SITE_OTHER): Payer: BLUE CROSS/BLUE SHIELD | Admitting: Family Medicine

## 2015-04-28 ENCOUNTER — Encounter: Payer: Self-pay | Admitting: Family Medicine

## 2015-04-28 VITALS — BP 125/83 | HR 82 | Temp 97.8°F | Ht 66.0 in | Wt 177.8 lb

## 2015-04-28 DIAGNOSIS — M25512 Pain in left shoulder: Principal | ICD-10-CM

## 2015-04-28 DIAGNOSIS — E785 Hyperlipidemia, unspecified: Secondary | ICD-10-CM

## 2015-04-28 DIAGNOSIS — M25519 Pain in unspecified shoulder: Secondary | ICD-10-CM | POA: Insufficient documentation

## 2015-04-28 DIAGNOSIS — M25511 Pain in right shoulder: Secondary | ICD-10-CM

## 2015-04-28 DIAGNOSIS — Z0000 Encounter for general adult medical examination without abnormal findings: Secondary | ICD-10-CM | POA: Insufficient documentation

## 2015-04-28 LAB — POCT GLYCOSYLATED HEMOGLOBIN (HGB A1C): Hemoglobin A1C: 5.5

## 2015-04-28 MED ORDER — TRAMADOL HCL 50 MG PO TABS: 50.0000 mg | ORAL_TABLET | Freq: Two times a day (BID) | ORAL | Status: DC | PRN

## 2015-04-28 NOTE — Patient Instructions (Signed)
Great to meet you!  We will call with results within 1 week

## 2015-04-28 NOTE — Progress Notes (Signed)
   HPI  Patient presents today here for physical exam.  Patient has no complaints except for bilateral shoulder pain.  Explained that he's had several years of bilateral shoulder pain that keeps him awake at night. Is no discrete injury. 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 He has pain with his bilateral shoulders when sleeping on his stomach, he is unable to sleep on his back. He has minimal results with 800 mg of Motrin.  Denies dyspnea, chest pain, palpitations, leg edema.  He works at [employer]   PMH: Smoking status noted ROS: Per HPI  Objective: BP 125/83 mmHg  Pulse 82  Temp(Src) 97.8 F (36.6 C) (Oral)  Ht '5\' 6"'$  (1.676 m)  Wt 177 lb 12.8 oz (80.65 kg)  BMI 28.71 kg/m2 Gen: NAD, alert, cooperative with exam HEENT: NCAT, PERRLA, TMs normal on the left, right partially obscured by cerumen, oropharynx clear, nares clear CV: RRR, good S1/S2, no murmur Resp: CTABL, no wheezes, non-labored Abd: SNTND, BS present, no guarding or organomegaly Ext: No edema, warm Neuro: Alert and oriented, No gross deficits Musculoskeletal: Bilateral shoulder pain with empty can test, left-sided pain with Hawkins test Full range of motion, slightly limited on the right with Apley scratch test.   Assessment and plan:  # Annual physical exam Normal Nonfasting labs today PSA per request  # Bilateral shoulder pain Previously well controlled with Tylenol No. 3, will have a trial tramadol. Discuss possible x-rays and injections.  HLD Lipid panel SImvastatin   Orders Placed This Encounter  Procedures  . CBC  . CMP14+EGFR  . Lipid Panel  . VITAMIN D 25 Hydroxy (Vit-D Deficiency, Fractures)  . PSA  . POCT glycosylated hemoglobin (Hb A1C)    Meds ordered this encounter  Medications  . traMADol (ULTRAM) 50 MG tablet    Sig: Take 1-2 tablets (50-100 mg total) by mouth every 12  (twelve) hours as needed.    Dispense:  60 tablet    Refill:  [place], MD [facility] 04/28/2015, 2:54 PM

## 2015-04-29 LAB — CMP14+EGFR
ALK PHOS: 57 IU/L (ref 39–117)
ALT: 23 IU/L (ref 0–44)
AST: 21 IU/L (ref 0–40)
Albumin/Globulin Ratio: 1.4 (ref 1.1–2.5)
Albumin: 4.4 g/dL (ref 3.6–4.8)
BILIRUBIN TOTAL: 0.4 mg/dL (ref 0.0–1.2)
BUN / CREAT RATIO: 17 (ref 10–22)
BUN: 22 mg/dL (ref 8–27)
CHLORIDE: 99 mmol/L (ref 96–106)
CO2: 27 mmol/L (ref 18–29)
Calcium: 9.2 mg/dL (ref 8.6–10.2)
Creatinine, Ser: 1.3 mg/dL — ABNORMAL HIGH (ref 0.76–1.27)
GFR calc Af Amer: 67 mL/min/{1.73_m2} (ref >59)
GFR calc non Af Amer: 58 mL/min/{1.73_m2} — ABNORMAL LOW (ref >59)
GLOBULIN, TOTAL: 3.1 g/dL (ref 1.5–4.5)
GLUCOSE: 97 mg/dL (ref 65–99)
Potassium: 4.2 mmol/L (ref 3.5–5.2)
SODIUM: 139 mmol/L (ref 134–144)
Total Protein: 7.5 g/dL (ref 6.0–8.5)

## 2015-04-29 LAB — CBC
Hematocrit: 40.3 % (ref 37.5–51.0)
Hemoglobin: 13.9 g/dL (ref 12.6–17.7)
MCH: 31.6 pg (ref 26.6–33.0)
MCHC: 34.5 g/dL (ref 31.5–35.7)
MCV: 92 fL (ref 79–97)
Platelets: 138 10*3/uL — ABNORMAL LOW (ref 150–379)
RBC: 4.4 x10E6/uL (ref 4.14–5.80)
RDW: 13.2 % (ref 12.3–15.4)
WBC: 5.2 10*3/uL (ref 3.4–10.8)

## 2015-04-29 LAB — LIPID PANEL
CHOLESTEROL TOTAL: 117 mg/dL (ref 100–199)
Chol/HDL Ratio: 3.1 ratio units (ref 0.0–5.0)
HDL: 38 mg/dL — ABNORMAL LOW (ref >39)
LDL Calculated: 61 mg/dL (ref 0–99)
Triglycerides: 91 mg/dL (ref 0–149)
VLDL Cholesterol Cal: 18 mg/dL (ref 5–40)

## 2015-04-29 LAB — PSA: Prostate Specific Ag, Serum: 0.4 ng/mL (ref 0.0–4.0)

## 2015-04-29 LAB — VITAMIN D 25 HYDROXY (VIT D DEFICIENCY, FRACTURES): Vit D, 25-Hydroxy: 40.6 ng/mL (ref 30.0–100.0)

## 2015-05-02 ENCOUNTER — Telehealth: Payer: Self-pay | Admitting: Family Medicine

## 2015-05-02 DIAGNOSIS — E785 Hyperlipidemia, unspecified: Secondary | ICD-10-CM

## 2015-05-02 NOTE — Progress Notes (Signed)
Patient aware.

## 2015-05-05 MED ORDER — SIMVASTATIN 40 MG PO TABS
40.0000 mg | ORAL_TABLET | Freq: Every day | ORAL | Status: DC
Start: 2015-05-05 — End: 2015-08-05

## 2015-05-05 NOTE — Telephone Encounter (Signed)
Patient aware.

## 2015-05-05 NOTE — Telephone Encounter (Signed)
Sent in [place], MD [facility] 05/05/2015, 10:28 AM

## 2015-06-01 ENCOUNTER — Encounter: Payer: Self-pay | Admitting: Family Medicine

## 2015-06-01 ENCOUNTER — Ambulatory Visit (INDEPENDENT_AMBULATORY_CARE_PROVIDER_SITE_OTHER): Payer: BLUE CROSS/BLUE SHIELD | Admitting: Family Medicine

## 2015-06-01 ENCOUNTER — Ambulatory Visit (INDEPENDENT_AMBULATORY_CARE_PROVIDER_SITE_OTHER): Payer: BLUE CROSS/BLUE SHIELD

## 2015-06-01 VITALS — BP 130/77 | HR 73 | Temp 97.7°F | Ht 66.0 in | Wt 179.4 lb

## 2015-06-01 DIAGNOSIS — M25571 Pain in right ankle and joints of right foot: Secondary | ICD-10-CM

## 2015-06-01 MED ORDER — DICLOFENAC SODIUM 75 MG PO TBEC: 75.0000 mg | DELAYED_RELEASE_TABLET | Freq: Two times a day (BID) | ORAL | Status: DC

## 2015-06-01 NOTE — Progress Notes (Signed)
Subjective:  Patient ID: Shawn Cline, male    DOB: 03-04-1951  Age: 65 y.o. MRN: [id number]  CC: Foot Pain   HPI Shawn Cline presents for 10 days of increasing pain at right foot. Severe with weight bearing especially bending otes to ambulate. NKI Pain is mild at rest. Described as sharp. No previous episodes   History Shawn Cline has a past medical history of Hypercholesteremia.   Shawn Cline has past surgical history that includes Hemorrhoid surgery.   His family history includes Atrial fibrillation in his mother; Cancer in his father.Shawn Cline reports that Shawn Cline quit smoking about 23 years ago. Shawn Cline has never used smokeless tobacco. Shawn Cline reports that Shawn Cline does not drink alcohol or use illicit drugs.    ROS Review of Systems  Constitutional: Positive for activity change. Negative for fever, chills, diaphoresis and appetite change.  HENT: Negative.   Respiratory: Negative.   Cardiovascular: Negative for chest pain.  Musculoskeletal: Positive for myalgias, joint swelling and arthralgias.  Skin: Negative for rash.  Neurological: Negative for weakness.    Objective:  BP 130/77 mmHg  Pulse 73  Temp(Src) 97.7 F (36.5 C) (Oral)  Ht 5\' 6"  (1.676 m)  Wt 179 lb 6.4 oz (81.375 kg)  BMI 28.97 kg/m2  SpO2 99%  BP Readings from Last 3 Encounters:  06/01/15 130/77  04/28/15 125/83  02/01/15 124/83    Wt Readings from Last 3 Encounters:  06/01/15 179 lb 6.4 oz (81.375 kg)  04/28/15 177 lb 12.8 oz (80.65 kg)  02/01/15 173 lb (78.472 kg)     Physical Exam  Constitutional: Shawn Cline is oriented to person, place, and time. Shawn Cline appears well-developed and well-nourished.  HENT:  Head: Normocephalic and atraumatic.  Right Ear: External ear normal.  Left Ear: External ear normal.  Mouth/Throat: No oropharyngeal exudate or posterior oropharyngeal erythema.  Eyes: Pupils are equal, round, and reactive to light.  Neck: Normal range of motion. Neck supple.  Cardiovascular: Normal rate and regular rhythm.     No murmur heard. Pulmonary/Chest: Breath sounds normal. No respiratory distress.  Abdominal: Bowel sounds are normal.  Musculoskeletal: Normal range of motion. Shawn Cline exhibits edema and tenderness (2+ edema with mild erythema at MTP 2-4 on right. Nontender for passive ROM, but limp noted ).  Neurological: Shawn Cline is alert and oriented to person, place, and time.  Skin: Skin is warm and dry.  Vitals reviewed.    Lab Results  Component Value Date   WBC 5.2 04/28/2015   HGB 13.9* 04/13/2014   HCT 40.3 04/28/2015   PLT 138* 04/28/2015   GLUCOSE 97 04/28/2015   CHOL 117 04/28/2015   TRIG 91 04/28/2015   HDL 38* 04/28/2015   LDLCALC 61 04/28/2015   ALT 23 04/28/2015   AST 21 04/28/2015   NA 139 04/28/2015   K 4.2 04/28/2015   CL 99 04/28/2015   CREATININE 1.30* 04/28/2015   BUN 22 04/28/2015   CO2 27 04/28/2015   PSA 0.5 04/13/2014   HGBA1C 5.5 04/28/2015    No results found.  Assessment & Plan:   Shawn Cline was seen today for foot pain.  Diagnoses and all orders for this visit:  Pain in joint, ankle and foot, right -     DG Foot Complete Right; Future -     CBC with Differential/Platelet -     Uric acid -     Post-op shoe  Other orders -     diclofenac (VOLTAREN) 75 MG EC tablet; Take 1 tablet (75 mg total) by  mouth 2 (two) times daily.      I am having Shawn Cline start on diclofenac. I am also having him maintain his ibuprofen, aspirin EC, cholecalciferol, traMADol, and simvastatin.  Meds ordered this encounter  Medications  . diclofenac (VOLTAREN) 75 MG EC tablet    Sig: Take 1 tablet (75 mg total) by mouth 2 (two) times daily.    Dispense:  60 tablet    Refill:  2     Follow-up: Return if symptoms worsen or fail to improve.  Claretta Fraise, M.D.

## 2015-06-02 LAB — CBC WITH DIFFERENTIAL/PLATELET
Basophils Absolute: 0 10*3/uL (ref 0.0–0.2)
Basos: 1 %
EOS (ABSOLUTE): 0.2 10*3/uL (ref 0.0–0.4)
EOS: 3 %
Hematocrit: 38.9 % (ref 37.5–51.0)
Hemoglobin: 13.5 g/dL (ref 12.6–17.7)
IMMATURE GRANULOCYTES: 0 %
Immature Grans (Abs): 0 10*3/uL (ref 0.0–0.1)
LYMPHS ABS: 1.4 10*3/uL (ref 0.7–3.1)
Lymphs: 29 %
MCH: 30.7 pg (ref 26.6–33.0)
MCHC: 34.7 g/dL (ref 31.5–35.7)
MCV: 88 fL (ref 79–97)
Monocytes Absolute: 0.6 10*3/uL (ref 0.1–0.9)
Monocytes: 13 %
NEUTROS PCT: 54 %
Neutrophils Absolute: 2.7 10*3/uL (ref 1.4–7.0)
PLATELETS: 156 10*3/uL (ref 150–379)
RBC: 4.4 x10E6/uL (ref 4.14–5.80)
RDW: 13.2 % (ref 12.3–15.4)
WBC: 4.9 10*3/uL (ref 3.4–10.8)

## 2015-06-02 LAB — URIC ACID: Uric Acid: 4.5 mg/dL (ref 3.7–8.6)

## 2015-08-05 ENCOUNTER — Encounter: Payer: Self-pay | Admitting: Family Medicine

## 2015-08-05 ENCOUNTER — Ambulatory Visit (INDEPENDENT_AMBULATORY_CARE_PROVIDER_SITE_OTHER): Payer: BLUE CROSS/BLUE SHIELD | Admitting: Family Medicine

## 2015-08-05 VITALS — BP 128/82 | HR 75 | Temp 98.0°F | Ht 66.0 in | Wt 181.0 lb

## 2015-08-05 DIAGNOSIS — M25511 Pain in right shoulder: Secondary | ICD-10-CM | POA: Diagnosis not present

## 2015-08-05 DIAGNOSIS — H6123 Impacted cerumen, bilateral: Secondary | ICD-10-CM

## 2015-08-05 DIAGNOSIS — E785 Hyperlipidemia, unspecified: Secondary | ICD-10-CM | POA: Diagnosis not present

## 2015-08-05 DIAGNOSIS — N183 Chronic kidney disease, stage 3 unspecified: Secondary | ICD-10-CM | POA: Insufficient documentation

## 2015-08-05 DIAGNOSIS — M25512 Pain in left shoulder: Secondary | ICD-10-CM | POA: Diagnosis not present

## 2015-08-05 MED ORDER — TRAMADOL HCL 50 MG PO TABS: 50.0000 mg | ORAL_TABLET | Freq: Two times a day (BID) | ORAL | Status: DC | PRN

## 2015-08-05 MED ORDER — VITAMIN D3 25 MCG (1000 UNIT) PO TABS: 1000.0000 [IU] | ORAL_TABLET | Freq: Every day | ORAL | Status: AC

## 2015-08-05 MED ORDER — DICLOFENAC SODIUM 75 MG PO TBEC: 75.0000 mg | DELAYED_RELEASE_TABLET | Freq: Two times a day (BID) | ORAL | Status: DC

## 2015-08-05 MED ORDER — SIMVASTATIN 40 MG PO TABS: 40.0000 mg | ORAL_TABLET | Freq: Every day | ORAL | Status: DC

## 2015-08-05 NOTE — Progress Notes (Signed)
   HPI  Patient presents today here for follow-up.  Patient explains that with the combination of tramadol and diclofenac he has practically no shoulder pain. He understands that NSAIDs are hard on his kidneys and agrees to decrease the dose to 1 pill once a day and try to decrease the dose from there if possible.  needs refills, 3 month supply if possible.  PMH: Smoking status noted ROS: Per HPI  Objective: BP 128/82 mmHg  Pulse 75  Temp(Src) 98 F (36.7 C) (Oral)  Ht 5\' 6"  (1.676 m)  Wt 181 lb (82.101 kg)  BMI 29.23 kg/m2 Gen: NAD, alert, cooperative with exam HEENT: NCAT, TMs obscured by cerumen bilaterally CV: RRR, good S1/S2, no murmur Resp: CTABL, no wheezes, non-labored Ext: No edema, warm Neuro: Alert and oriented, No gross deficits  Assessment and plan:  # Shoulder pain Improved greatly with tramadol and NSAIDs He understands the renal stress involved with NSAIDs and agrees to decrease the dose of diclofenac Refilled both  # Chronic kidney disease stage III Discussed limiting NSAIDs, plenty fluids At least annual labs  # Hyperlipidemia Tolerating simvastatin well Refilled  # Impacted cerumen Washed out in clinic     Meds ordered this encounter  Medications  . cholecalciferol (VITAMIN D) 1000 units tablet    Sig: Take 1 tablet (1,000 Units total) by mouth daily.    Dispense:  90 tablet    Refill:  3  . traMADol (ULTRAM) 50 MG tablet    Sig: Take 1-2 tablets (50-100 mg total) by mouth every 12 (twelve) hours as needed.    Dispense:  90 tablet    Refill:  3  . simvastatin (ZOCOR) 40 MG tablet    Sig: Take 1 tablet (40 mg total) by mouth at bedtime.    Dispense:  90 tablet    Refill:  3  . diclofenac (VOLTAREN) 75 MG EC tablet    Sig: Take 1 tablet (75 mg total) by mouth 2 (two) times daily.    Dispense:  180 tablet    Refill:  0    Laroy Apple, MD [facility] 08/05/2015, 4:32 PM

## 2015-08-05 NOTE — Patient Instructions (Signed)
Great to see you!  I have sent 1 year refill on everything but Voltaren, try to decrease the dose  I would recommend re-check of your kidney function in 6 months

## 2016-02-10 ENCOUNTER — Ambulatory Visit: Payer: BLUE CROSS/BLUE SHIELD | Admitting: Family Medicine

## 2016-02-17 ENCOUNTER — Ambulatory Visit (INDEPENDENT_AMBULATORY_CARE_PROVIDER_SITE_OTHER): Payer: BLUE CROSS/BLUE SHIELD | Admitting: Family Medicine

## 2016-02-17 ENCOUNTER — Encounter: Payer: Self-pay | Admitting: Family Medicine

## 2016-02-17 VITALS — BP 138/83 | HR 74 | Temp 97.5°F | Ht 66.0 in | Wt 176.4 lb

## 2016-02-17 DIAGNOSIS — Z0000 Encounter for general adult medical examination without abnormal findings: Secondary | ICD-10-CM

## 2016-02-17 DIAGNOSIS — L719 Rosacea, unspecified: Secondary | ICD-10-CM

## 2016-02-17 DIAGNOSIS — M25512 Pain in left shoulder: Principal | ICD-10-CM

## 2016-02-17 DIAGNOSIS — H6123 Impacted cerumen, bilateral: Secondary | ICD-10-CM

## 2016-02-17 DIAGNOSIS — Z23 Encounter for immunization: Secondary | ICD-10-CM

## 2016-02-17 DIAGNOSIS — M25511 Pain in right shoulder: Secondary | ICD-10-CM

## 2016-02-17 DIAGNOSIS — N183 Chronic kidney disease, stage 3 unspecified: Secondary | ICD-10-CM

## 2016-02-17 DIAGNOSIS — Z125 Encounter for screening for malignant neoplasm of prostate: Secondary | ICD-10-CM

## 2016-02-17 DIAGNOSIS — E785 Hyperlipidemia, unspecified: Secondary | ICD-10-CM

## 2016-02-17 MED ORDER — DICLOFENAC SODIUM 75 MG PO TBEC: 75.0000 mg | DELAYED_RELEASE_TABLET | Freq: Every day | ORAL | 1 refills | Status: DC

## 2016-02-17 MED ORDER — METRONIDAZOLE 1 % EX GEL: Freq: Every day | CUTANEOUS | 3 refills | Status: DC

## 2016-02-17 NOTE — Patient Instructions (Signed)
Great to see you!  Come back in 6 months unless you need [place] sooner.   We iwll call with lab results within 1 week.

## 2016-02-17 NOTE — Progress Notes (Signed)
   HPI  Patient presents today here for routine follow-up of shoulder pain, hyperlipidemia, and annual physical exam.  Annual physical exam Active at work, no formal exercise Once a flu shot today.  Impacted cerumen Dull hearing for a few months, he needs routine ear irrigation.  Shoulder pain Helped by diclofenac at night, he's taking this only at night and understands that it may be causing renal stress. He would like to recheck his renal function as well. Tramadol for daytime pain, does not cause any dizziness, states "it's like taking an aspirin"  PMH: Smoking status noted ROS: Per HPI  Objective: BP 138/83   Pulse 74   Temp 97.5 F (36.4 C) (Oral)   Ht _0  (1.676 m)   Wt 176 lb 6.4 oz (80 kg)   BMI 28.47 kg/m  Gen: NAD, alert, cooperative with exam HEENT: NCAT, TMs obscured by cerumen bilaterally, after ear irrigation and curetting his cerumen is completely removed CV: RRR, good S1/S2, no murmur Resp: CTABL, no wheezes, non-labored Abd: SNTND, BS present, no guarding or organomegaly Ext: No edema, warm Neuro: Alert and oriented, No gross deficits  Assessment and plan:  # Physical exam Normal exam Flu shot today PSA per Request- screening for prostate cancer  # Shoulder pain Controlled with diclofenac at night and tramadol during the day He is using tramadol sparingly, does not need a refill. refilled diclofenac  # Rosacea Mild, worsening without meds Treat with MetroGel  # Acute kidney disease stage III Repeat labs, monitor closely with chronic NSAID use  Hyperlipidemia Repeating LDL, nonfasting Continue simvastatin  Fluids a vaccine given today- counseling provided for all components  Orders Placed This Encounter  Procedures  . CMP14+EGFR  . LDL Cholesterol, Direct  . PSA  . CBC with Differential    Meds ordered this encounter  Medications  . diclofenac (VOLTAREN) 75 MG EC tablet    Sig: Take 1 tablet (75 mg total) by mouth at bedtime.      Dispense:  90 tablet    Refill:  [place], MD [facility] 02/17/2016, 4:42 PM

## 2016-02-18 LAB — CMP14+EGFR
ALBUMIN: 4.3 g/dL (ref 3.6–4.8)
ALT: 26 IU/L (ref 0–44)
AST: 20 IU/L (ref 0–40)
Albumin/Globulin Ratio: 1.2 (ref 1.2–2.2)
Alkaline Phosphatase: 60 IU/L (ref 39–117)
BILIRUBIN TOTAL: 0.4 mg/dL (ref 0.0–1.2)
BUN / CREAT RATIO: 13 (ref 10–24)
BUN: 21 mg/dL (ref 8–27)
CHLORIDE: 99 mmol/L (ref 96–106)
CO2: 27 mmol/L (ref 18–29)
Calcium: 9.5 mg/dL (ref 8.6–10.2)
Creatinine, Ser: 1.68 mg/dL — ABNORMAL HIGH (ref 0.76–1.27)
GFR calc Af Amer: 49 mL/min/{1.73_m2} — ABNORMAL LOW (ref >59)
GFR calc non Af Amer: 42 mL/min/{1.73_m2} — ABNORMAL LOW (ref >59)
GLOBULIN, TOTAL: 3.6 g/dL (ref 1.5–4.5)
Glucose: 96 mg/dL (ref 65–99)
Potassium: 3.9 mmol/L (ref 3.5–5.2)
SODIUM: 140 mmol/L (ref 134–144)
TOTAL PROTEIN: 7.9 g/dL (ref 6.0–8.5)

## 2016-02-18 LAB — CBC WITH DIFFERENTIAL/PLATELET
BASOS: 1 %
Basophils Absolute: 0 10*3/uL (ref 0.0–0.2)
EOS (ABSOLUTE): 0.1 10*3/uL (ref 0.0–0.4)
Eos: 2 %
HEMATOCRIT: 38.1 % (ref 37.5–51.0)
Hemoglobin: 12.8 g/dL (ref 12.6–17.7)
IMMATURE GRANS (ABS): 0 10*3/uL (ref 0.0–0.1)
Immature Granulocytes: 0 %
Lymphocytes Absolute: 2.1 10*3/uL (ref 0.7–3.1)
Lymphs: 38 %
MCH: 30.5 pg (ref 26.6–33.0)
MCHC: 33.6 g/dL (ref 31.5–35.7)
MCV: 91 fL (ref 79–97)
MONOS ABS: 0.5 10*3/uL (ref 0.1–0.9)
Monocytes: 10 %
Neutrophils Absolute: 2.8 10*3/uL (ref 1.4–7.0)
Neutrophils: 49 %
Platelets: 132 10*3/uL — ABNORMAL LOW (ref 150–379)
RBC: 4.19 x10E6/uL (ref 4.14–5.80)
RDW: 13.2 % (ref 12.3–15.4)
WBC: 5.7 10*3/uL (ref 3.4–10.8)

## 2016-02-18 LAB — LDL CHOLESTEROL, DIRECT: LDL Direct: 64 mg/dL (ref 0–99)

## 2016-02-18 LAB — PSA: Prostate Specific Ag, Serum: 0.4 ng/mL (ref 0.0–4.0)

## 2016-02-20 ENCOUNTER — Telehealth: Payer: Self-pay | Admitting: Family Medicine

## 2016-02-20 DIAGNOSIS — N183 Chronic kidney disease, stage 3 unspecified: Secondary | ICD-10-CM

## 2016-02-20 NOTE — Telephone Encounter (Signed)
Called and discussed with wife.  Normal PSA, cholesterol, blood counts,lytes , and liver enzymes.  Cre has bumped, Reduce or stop NSAID, can try 1/2 voltaren with increased fluids. Other options are two tramadol at night, two Tylenol, 2 Tylenol plus tramadol.  Rtc for lab only visit in 1 month for recheck.   Laroy Apple, MD [facility] 02/20/2016, 5:43 PM

## 2016-04-12 ENCOUNTER — Other Ambulatory Visit: Payer: BLUE CROSS/BLUE SHIELD

## 2016-04-12 DIAGNOSIS — N183 Chronic kidney disease, stage 3 unspecified: Secondary | ICD-10-CM

## 2016-04-12 LAB — BMP8+EGFR
BUN/Creatinine Ratio: 16 (ref 10–24)
BUN: 21 mg/dL (ref 8–27)
CO2: 27 mmol/L (ref 18–29)
Calcium: 9.1 mg/dL (ref 8.6–10.2)
Chloride: 101 mmol/L (ref 96–106)
Creatinine, Ser: 1.33 mg/dL — ABNORMAL HIGH (ref 0.76–1.27)
GFR, EST AFRICAN AMERICAN: 65 mL/min/{1.73_m2} (ref >59)
GFR, EST NON AFRICAN AMERICAN: 56 mL/min/{1.73_m2} — AB (ref >59)
Glucose: 97 mg/dL (ref 65–99)
POTASSIUM: 4.1 mmol/L (ref 3.5–5.2)
SODIUM: 140 mmol/L (ref 134–144)

## 2016-06-29 DIAGNOSIS — H25013 Cortical age-related cataract, bilateral: Secondary | ICD-10-CM | POA: Diagnosis not present

## 2016-06-29 DIAGNOSIS — H53032 Strabismic amblyopia, left eye: Secondary | ICD-10-CM | POA: Diagnosis not present

## 2016-06-29 DIAGNOSIS — H52223 Regular astigmatism, bilateral: Secondary | ICD-10-CM | POA: Diagnosis not present

## 2016-07-12 ENCOUNTER — Other Ambulatory Visit: Payer: Self-pay | Admitting: Family Medicine

## 2016-07-12 DIAGNOSIS — E785 Hyperlipidemia, unspecified: Secondary | ICD-10-CM

## 2016-09-14 DIAGNOSIS — M19012 Primary osteoarthritis, left shoulder: Secondary | ICD-10-CM | POA: Diagnosis not present

## 2016-09-14 DIAGNOSIS — Z7982 Long term (current) use of aspirin: Secondary | ICD-10-CM | POA: Diagnosis not present

## 2016-09-14 DIAGNOSIS — E785 Hyperlipidemia, unspecified: Secondary | ICD-10-CM | POA: Diagnosis not present

## 2016-09-14 DIAGNOSIS — H5450 Low vision, one eye, unspecified eye: Secondary | ICD-10-CM | POA: Diagnosis not present

## 2016-09-14 DIAGNOSIS — Z79899 Other long term (current) drug therapy: Secondary | ICD-10-CM | POA: Diagnosis not present

## 2016-09-14 DIAGNOSIS — Z87891 Personal history of nicotine dependence: Secondary | ICD-10-CM | POA: Diagnosis not present

## 2016-09-14 DIAGNOSIS — K5900 Constipation, unspecified: Secondary | ICD-10-CM | POA: Diagnosis not present

## 2016-09-14 DIAGNOSIS — Z6827 Body mass index (BMI) 27.0-27.9, adult: Secondary | ICD-10-CM | POA: Diagnosis not present

## 2016-09-14 DIAGNOSIS — Z0000 Encounter for general adult medical examination without abnormal findings: Secondary | ICD-10-CM | POA: Diagnosis not present

## 2016-09-14 DIAGNOSIS — M19011 Primary osteoarthritis, right shoulder: Secondary | ICD-10-CM | POA: Diagnosis not present

## 2016-10-25 ENCOUNTER — Other Ambulatory Visit: Payer: Self-pay | Admitting: Family Medicine

## 2016-10-25 DIAGNOSIS — E785 Hyperlipidemia, unspecified: Secondary | ICD-10-CM

## 2016-10-29 ENCOUNTER — Ambulatory Visit (INDEPENDENT_AMBULATORY_CARE_PROVIDER_SITE_OTHER): Payer: Medicare HMO

## 2016-10-29 ENCOUNTER — Ambulatory Visit (INDEPENDENT_AMBULATORY_CARE_PROVIDER_SITE_OTHER): Payer: Medicare HMO | Admitting: Family

## 2016-10-29 ENCOUNTER — Encounter: Payer: Self-pay | Admitting: Family

## 2016-10-29 VITALS — BP 129/80 | HR 67 | Temp 97.5°F | Ht 66.0 in | Wt 173.8 lb

## 2016-10-29 DIAGNOSIS — R0781 Pleurodynia: Secondary | ICD-10-CM

## 2016-10-29 DIAGNOSIS — S20212A Contusion of left front wall of thorax, initial encounter: Secondary | ICD-10-CM

## 2016-10-29 DIAGNOSIS — K5900 Constipation, unspecified: Secondary | ICD-10-CM

## 2016-10-29 NOTE — Patient Instructions (Signed)
Rib Contusion A rib contusion is a deep bruise on your rib area. Contusions are the result of a blunt trauma that causes bleeding and injury to the tissues under the skin. A rib contusion may involve bruising of the ribs and of the skin and muscles in the area. The skin overlying the contusion may turn blue, purple, or yellow. Minor injuries will give you a painless contusion, but more severe contusions may stay painful and swollen for a few weeks. What are the causes? A contusion is usually caused by a blow, trauma, or direct force to an area of the body. This often occurs while playing contact sports. What are the signs or symptoms?  Swelling and redness of the injured area.  Discoloration of the injured area.  Tenderness and soreness of the injured area.  Pain with or without movement. How is this diagnosed? The diagnosis can be made by taking a medical history and performing a physical exam. An X-ray, CT scan, or MRI may be needed to determine if there were any associated injuries, such as broken bones (fractures) or internal injuries. How is this treated? Often, the best treatment for a rib contusion is rest. Icing or applying cold compresses to the injured area may help reduce swelling and inflammation. Deep breathing exercises may be recommended to reduce the risk of partial lung collapse and pneumonia. Over-the-counter or prescription medicines may also be recommended for pain control. Follow these instructions at home:  Apply ice to the injured area: ? Put ice in a plastic bag. ? Place a towel between your skin and the bag. ? Leave the ice on for 20 minutes, 2-3 times per day.  Take medicines only as directed by your health care provider.  Rest the injured area. Avoid strenuous activity and any activities or movements that cause pain. Be careful during activities and avoid bumping the injured area.  Perform deep-breathing exercises as directed by your health care provider.  Do  not lift anything that is heavier than 5 lb (2.3 kg) until your health care provider approves.  Do not use any tobacco products, including cigarettes, chewing tobacco, or electronic cigarettes. If you need help quitting, ask your health care provider. Contact a health care provider if:  You have increased bruising or swelling.  You have pain that is not controlled with treatment.  You have a fever. Get help right away if:  You have difficulty breathing or shortness of breath.  You develop a continual cough, or you cough up thick or bloody sputum.  You feel sick to your stomach (nauseous), you throw up (vomit), or you have abdominal pain. This information is not intended to replace advice given to you by your health care provider. Make sure you discuss any questions you have with your health care provider. Document Released: 12/26/2000 Document Revised: 09/08/2015 Document Reviewed: 01/12/2014 [employer] Interactive Patient Education  2018 [employer]  

## 2016-10-29 NOTE — Progress Notes (Signed)
   Subjective:    Patient ID: Shawn Cline, male    DOB: January 17, 1951, 66 y.o.   MRN: [id number]  PT presents to the office today with back/rib pain after falling 6 feet from a ladder on Saturday. Pt states he has had a small BM this morning, but feels constipated. He states he took Miralax last night.  Fall  The accident occurred 2 days ago. The fall occurred from a ladder. He fell from a height of 6 to 10 ft. He landed on concrete. Point of impact: back. Pain location: back and abdomen. The pain is at a severity of 6/10. The pain is moderate. The symptoms are aggravated by movement and pressure on injury. Associated symptoms include abdominal pain. Pertinent negatives include no bowel incontinence, fever, hematuria, loss of consciousness or visual change. He has tried acetaminophen and NSAID for the symptoms. The treatment provided mild relief.      Review of Systems  Constitutional: Negative for fever.  Gastrointestinal: Positive for abdominal pain. Negative for bowel incontinence.  Genitourinary: Negative for hematuria.  Musculoskeletal: Positive for back pain (left thoracic).  Neurological: Negative for loss of consciousness.  All other systems reviewed and are negative.      Objective:   Physical Exam  Constitutional: He is oriented to person, place, and time. He appears well-developed and well-nourished. No distress.  HENT:  Head: Normocephalic.  Eyes: Pupils are equal, round, and reactive to light. Right eye exhibits no discharge. Left eye exhibits no discharge.  Neck: Normal range of motion. Neck supple. No thyromegaly present.  Cardiovascular: Normal rate, regular rhythm, normal heart sounds and intact distal pulses.   No murmur heard. Pulmonary/Chest: Effort normal and breath sounds normal. No respiratory distress. He has no wheezes.  Abdominal: Soft. Bowel sounds are normal. He exhibits no distension. There is no tenderness.  Musculoskeletal: He exhibits no edema or  tenderness.  Full ROM, slow movements with extension, tenderness to thoracic area with palpation   Neurological: He is alert and oriented to person, place, and time.  Skin: Skin is warm and dry. Ecchymosis (present on thoracic area) noted. No rash noted. No erythema.  Psychiatric: He has a normal mood and affect. His behavior is normal. Judgment and thought content normal.  Vitals reviewed.  X-ray- Negative for rib fracture Preliminary reading by Evelina Dun, FNP [facility]    BP 129/80   Pulse 67   Temp (!) 97.5 F (36.4 C) (Oral)   Ht 5\' 6"  (1.676 m)   Wt 173 lb 12.8 oz (78.8 kg)   BMI 28.05 kg/m       Assessment & Plan:  1. Rib pain on left side - DG Ribs Unilateral W/Chest Left; Future  2. Contusion of rib on left side, initial encounter  3. Constipation, unspecified constipation type Force fluids Take Miralax    Continue diclofenac as needed Ice Rest Deep breathing and cough Discussed importance of calling office if pt can not have bowel movement or if abdomen pain worsens   Evelina Dun, FNP

## 2017-02-21 ENCOUNTER — Other Ambulatory Visit: Payer: Self-pay | Admitting: Family Medicine

## 2017-02-21 DIAGNOSIS — E785 Hyperlipidemia, unspecified: Secondary | ICD-10-CM

## 2017-02-22 NOTE — Telephone Encounter (Signed)
Last lipid 02/17/16 

## 2017-06-01 ENCOUNTER — Telehealth: Payer: Self-pay | Admitting: Family Medicine

## 2017-06-01 ENCOUNTER — Encounter: Payer: Self-pay | Admitting: Family Medicine

## 2017-06-01 ENCOUNTER — Ambulatory Visit (INDEPENDENT_AMBULATORY_CARE_PROVIDER_SITE_OTHER): Payer: Medicare HMO | Admitting: Family Medicine

## 2017-06-01 VITALS — BP 136/77 | HR 110 | Temp 102.7°F | Ht 66.0 in | Wt 168.0 lb

## 2017-06-01 DIAGNOSIS — J101 Influenza due to other identified influenza virus with other respiratory manifestations: Secondary | ICD-10-CM

## 2017-06-01 DIAGNOSIS — R52 Pain, unspecified: Secondary | ICD-10-CM | POA: Diagnosis not present

## 2017-06-01 MED ORDER — OSELTAMIVIR PHOSPHATE 75 MG PO CAPS: 75.0000 mg | ORAL_CAPSULE | Freq: Two times a day (BID) | ORAL | 0 refills | Status: DC

## 2017-06-01 MED ORDER — HYDROCODONE-HOMATROPINE 5-1.5 MG/5ML PO SYRP: 5.0000 mL | ORAL_SOLUTION | Freq: Four times a day (QID) | ORAL | 0 refills | Status: DC | PRN

## 2017-06-01 NOTE — Telephone Encounter (Signed)
Patient aware.

## 2017-06-01 NOTE — Progress Notes (Signed)
BP 136/77   Pulse (!) 110   Temp (!) 102.7 F (39.3 C) (Oral)   Ht 5\' 6"  (1.676 m)   Wt 168 lb (76.2 kg)   BMI 27.12 kg/m    Subjective:    Patient ID: Shawn Cline, male    DOB: Sep 05, 1950, 67 y.o.   MRN: [id number]  HPI: Ali Mohl is a 67 y.o. male presenting on 06/01/2017 for Chills, cough, headache, body ache   HPI Cough and headache and body aches and chills Patient comes in today with complaints of cough and headache and body ache and chills is been going on for the past 3 days.  He denies any sick contacts that he knows of.  He has been using some Tylenol and ibuprofen for this.  That have been helping.  He has a high fever today of 102.7 and he said the fever really kicked in yesterday.  He denies any shortness of breath or wheezing but does have pressure throughout his head and into his ears as well.  His cough has been productive of yellow-green sputum and has been worsening.  His fever is also been worsening.  Relevant past medical, surgical, family and social history reviewed and updated as indicated. Interim medical history since our last visit reviewed. Allergies and medications reviewed and updated.  Review of Systems  Constitutional: Positive for chills and fever.  HENT: Positive for congestion, postnasal drip, rhinorrhea, sinus pressure, sneezing and sore throat. Negative for ear discharge, ear pain and voice change.   Eyes: Negative for pain, discharge, redness and visual disturbance.  Respiratory: Positive for cough. Negative for chest tightness, shortness of breath and wheezing.   Cardiovascular: Negative for chest pain and leg swelling.  Gastrointestinal: Negative for abdominal pain, constipation and diarrhea.  Musculoskeletal: Positive for myalgias. Negative for back pain and gait problem.  Skin: Negative for rash.  Neurological: Positive for headaches. Negative for syncope and light-headedness.  All other systems reviewed and are negative.   Per  HPI unless specifically indicated above   Allergies as of 06/01/2017   No Known Allergies     Medication List        Accurate as of 06/01/17 10:35 AM. Always use your most recent med list.          aspirin EC 81 MG tablet Take 81 mg by mouth daily.   cholecalciferol 1000 units tablet Commonly known as:  VITAMIN D Take 1 tablet (1,000 Units total) by mouth daily.   diclofenac 75 MG EC tablet Commonly known as:  VOLTAREN Take 1 tablet (75 mg total) by mouth at bedtime.   metroNIDAZOLE 1 % gel Commonly known as:  METROGEL Apply topically daily.   simvastatin 40 MG tablet Commonly known as:  ZOCOR Take 1 tablet (40 mg total) by mouth at bedtime.   simvastatin 40 MG tablet Commonly known as:  ZOCOR TAKE ONE TABLET AT BEDTIME   traMADol 50 MG tablet Commonly known as:  ULTRAM Take 1-2 tablets (50-100 mg total) by mouth every 12 (twelve) hours as needed.          Objective:    BP 136/77   Pulse (!) 110   Temp (!) 102.7 F (39.3 C) (Oral)   Ht 5\' 6"  (1.676 m)   Wt 168 lb (76.2 kg)   BMI 27.12 kg/m   Wt Readings from Last 3 Encounters:  06/01/17 168 lb (76.2 kg)  10/29/16 173 lb 12.8 oz (78.8 kg)  02/17/16 176 lb 6.4 oz (  80 kg)    Physical Exam  Constitutional: He is oriented to person, place, and time. He appears well-developed and well-nourished. No distress.  HENT:  Right Ear: Tympanic membrane, external ear and ear canal normal.  Left Ear: Tympanic membrane, external ear and ear canal normal.  Nose: Mucosal edema and rhinorrhea present. No sinus tenderness. No epistaxis. Right sinus exhibits frontal sinus tenderness. Right sinus exhibits no maxillary sinus tenderness. Left sinus exhibits frontal sinus tenderness. Left sinus exhibits no maxillary sinus tenderness.  Mouth/Throat: Uvula is midline and mucous membranes are normal. Posterior oropharyngeal edema and posterior oropharyngeal erythema present. No oropharyngeal exudate or tonsillar abscesses.  Eyes:  Conjunctivae are normal. No scleral icterus.  Neck: Neck supple. No thyromegaly present.  Cardiovascular: Normal rate, regular rhythm, normal heart sounds and intact distal pulses.  No murmur heard. Pulmonary/Chest: Effort normal and breath sounds normal. No respiratory distress. He has no wheezes. He has no rales.  Musculoskeletal: Normal range of motion. He exhibits no edema.  Lymphadenopathy:    He has no cervical adenopathy.  Neurological: He is alert and oriented to person, place, and time. Coordination normal.  Skin: Skin is warm and dry. No rash noted. He is not diaphoretic.  Psychiatric: He has a normal mood and affect. His behavior is normal.  Nursing note and vitals reviewed.   Influenza A positive    Assessment & Plan:   Problem List Items Addressed This Visit    None    Visit Diagnoses    Influenza A    -  Primary   Relevant Medications   oseltamivir (TAMIFLU) 75 MG capsule   Other Relevant Orders   Veritor Flu A/B Waived       Follow up plan: Return if symptoms worsen or fail to improve.  Counseling provided for all of the vaccine components Orders Placed This Encounter  Procedures  . Veritor Flu A/B Ulyses Southward, MD [facility] 06/01/2017, 10:35 AM

## 2017-06-01 NOTE — Telephone Encounter (Signed)
Let patient know that I sent over Hycodan cough syrup for him

## 2017-06-03 LAB — VERITOR FLU A/B WAIVED
INFLUENZA A: POSITIVE — AB
Influenza B: NEGATIVE

## 2017-06-07 ENCOUNTER — Ambulatory Visit (INDEPENDENT_AMBULATORY_CARE_PROVIDER_SITE_OTHER): Payer: Medicare HMO

## 2017-06-07 ENCOUNTER — Encounter: Payer: Self-pay | Admitting: Family Medicine

## 2017-06-07 ENCOUNTER — Telehealth: Payer: Self-pay | Admitting: Family Medicine

## 2017-06-07 ENCOUNTER — Ambulatory Visit: Payer: Medicare HMO | Admitting: Family Medicine

## 2017-06-07 ENCOUNTER — Ambulatory Visit (INDEPENDENT_AMBULATORY_CARE_PROVIDER_SITE_OTHER): Payer: Medicare HMO | Admitting: Family Medicine

## 2017-06-07 VITALS — BP 124/78 | HR 90 | Temp 100.6°F | Ht 66.0 in | Wt 165.0 lb

## 2017-06-07 DIAGNOSIS — J189 Pneumonia, unspecified organism: Secondary | ICD-10-CM

## 2017-06-07 DIAGNOSIS — R05 Cough: Secondary | ICD-10-CM | POA: Diagnosis not present

## 2017-06-07 DIAGNOSIS — R059 Cough, unspecified: Secondary | ICD-10-CM

## 2017-06-07 MED ORDER — CEFTRIAXONE SODIUM 1 G IJ SOLR
1.0000 g | Freq: Once | INTRAMUSCULAR | Status: AC
  Administered 2017-06-07: 1 g via INTRAMUSCULAR

## 2017-06-07 MED ORDER — AZITHROMYCIN 250 MG PO TABS: ORAL_TABLET | ORAL | 0 refills | Status: DC

## 2017-06-07 NOTE — Telephone Encounter (Signed)
Patient coming in to be seen 

## 2017-06-07 NOTE — Progress Notes (Signed)
BP 124/78   Pulse 90   Temp (!) 100.6 F (38.1 C) (Oral)   Ht 5\' 6"  (1.676 m)   Wt 165 lb (74.8 kg)   SpO2 94%   BMI 26.63 kg/m    Subjective:    Patient ID: Shawn Cline, male    DOB: 03/17/1951, 67 y.o.   MRN: [id number]  HPI: Shawn Cline is a 67 y.o. male presenting on 06/07/2017 for Cough (diagnosed with flu last week, still has cough and feels bad,  has been taking Hycodan cough syrup)   HPI Pt presents with severe cough today. Was dx with flu on 2/16 and took Tamiflu. Says it made his body aches feel a little better, but cough has only worsened. It has progressively worsened for the last 4 days and he has felt feverish. Today he has a 100.55F and 94% O2 sats.  He is taking Hycodan and cough drops, which he says are "not touching the cough". It is worse when laying down and he is coughing up brown sputum. He also tastes blood in his mouth. He also reports SOB, wheezing, HA, and lower back pain from coughing constantly. No hx of asthma, COPD, or current smoking (he quit about 30 years ago). He has 2 family members sick with flu also-- they do not seem to have PNA symptoms at this time.  Relevant past medical, surgical, family and social history reviewed and updated as indicated. Interim medical history since our last visit reviewed. Allergies and medications reviewed and updated.  Review of Systems  Constitutional: Positive for chills, fatigue and fever.  HENT: Positive for congestion and rhinorrhea.   Respiratory: Positive for cough, chest tightness, shortness of breath and wheezing.   Gastrointestinal: Negative for diarrhea, nausea and vomiting.  Genitourinary: Negative for difficulty urinating and dysuria.  Musculoskeletal: Positive for back pain (from coughing).  Neurological: Positive for headaches.    Per HPI unless specifically indicated above   Allergies as of 06/07/2017   No Known Allergies     Medication List        Accurate as of 06/07/17 10:09 AM.  Always use your most recent med list.          aspirin EC 81 MG tablet Take 81 mg by mouth daily.   azithromycin 250 MG tablet Commonly known as:  ZITHROMAX Take 2 the first day and then one each day after.   cholecalciferol 1000 units tablet Commonly known as:  VITAMIN D Take 1 tablet (1,000 Units total) by mouth daily.   diclofenac 75 MG EC tablet Commonly known as:  VOLTAREN Take 1 tablet (75 mg total) by mouth at bedtime.   HYDROcodone-homatropine 5-1.5 MG/5ML syrup Commonly known as:  HYCODAN Take 5 mLs by mouth every 6 (six) hours as needed for cough.   metroNIDAZOLE 1 % gel Commonly known as:  METROGEL Apply topically daily.   simvastatin 40 MG tablet Commonly known as:  ZOCOR Take 1 tablet (40 mg total) by mouth at bedtime.   simvastatin 40 MG tablet Commonly known as:  ZOCOR TAKE ONE TABLET AT BEDTIME   traMADol 50 MG tablet Commonly known as:  ULTRAM Take 1-2 tablets (50-100 mg total) by mouth every 12 (twelve) hours as needed.          Objective:    BP 124/78   Pulse 90   Temp (!) 100.6 F (38.1 C) (Oral)   Ht 5\' 6"  (1.676 m)   Wt 165 lb (74.8 kg)   SpO2  94%   BMI 26.63 kg/m   Wt Readings from Last 3 Encounters:  06/07/17 165 lb (74.8 kg)  06/01/17 168 lb (76.2 kg)  10/29/16 173 lb 12.8 oz (78.8 kg)    Physical Exam  Constitutional: He is oriented to person, place, and time. He appears well-developed and well-nourished. He appears ill.  HENT:  Right Ear: Tympanic membrane and ear canal normal.  Left Ear: Tympanic membrane and ear canal normal.  Nose: Rhinorrhea present. Right sinus exhibits no maxillary sinus tenderness and no frontal sinus tenderness. Left sinus exhibits no maxillary sinus tenderness and no frontal sinus tenderness.  Mouth/Throat: Posterior oropharyngeal erythema present.  Cardiovascular: Normal rate, regular rhythm and normal heart sounds.  Pulmonary/Chest: Breath sounds normal. Accessory muscle usage present. He has no  wheezes. He has no rhonchi. He has no rales.  Neurological: He is alert and oriented to person, place, and time.    Results for orders placed or performed in visit on 06/01/17  Veritor Flu A/B Waived  Result Value Ref Range   Influenza A Positive (A) Negative   Influenza B Negative Negative      Assessment & Plan:   Problem List Items Addressed This Visit    None    Visit Diagnoses    Atypical pneumonia    -  Primary   Relevant Medications   cefTRIAXone (ROCEPHIN) injection 1 g (Completed)   azithromycin (ZITHROMAX) 250 MG tablet   Other Relevant Orders   DG Chest 2 View (Completed)      Will treat as atypical PNA as pt exhibits signs of pneumonia (fever, productive cough with brown sputum, persistent cough, 94% sats).  Ordered Rocephin injection 1 g and Azithromycin 250 mg.   Encourage fluids and rest. Encourage pt to stay off work for at least 24 hours after fever subsides.  Also instructed pt to call if sx worsen.  Follow up plan: Return if symptoms worsen or fail to improve.  Counseling provided for all of the vaccine components Orders Placed This Encounter  Procedures  . DG Chest 2 View    Patient seen and examined with Chaney Malling PA student, agree with assessment and plan above.  Will treat like patient is starting to develop pneumonia status post treatment for influenza. Caryl Pina, MD [facility] 06/11/2017, 11:35 AM

## 2017-06-17 ENCOUNTER — Other Ambulatory Visit: Payer: Self-pay | Admitting: Family Medicine

## 2017-06-17 DIAGNOSIS — E785 Hyperlipidemia, unspecified: Secondary | ICD-10-CM

## 2017-06-18 NOTE — Telephone Encounter (Signed)
Last lipid 02/17/16 

## 2017-07-27 DIAGNOSIS — H6123 Impacted cerumen, bilateral: Secondary | ICD-10-CM | POA: Diagnosis not present

## 2017-08-02 DIAGNOSIS — Z809 Family history of malignant neoplasm, unspecified: Secondary | ICD-10-CM | POA: Diagnosis not present

## 2017-08-02 DIAGNOSIS — E785 Hyperlipidemia, unspecified: Secondary | ICD-10-CM | POA: Diagnosis not present

## 2017-08-02 DIAGNOSIS — K5900 Constipation, unspecified: Secondary | ICD-10-CM | POA: Diagnosis not present

## 2017-08-02 DIAGNOSIS — R69 Illness, unspecified: Secondary | ICD-10-CM | POA: Diagnosis not present

## 2017-08-02 DIAGNOSIS — L719 Rosacea, unspecified: Secondary | ICD-10-CM | POA: Diagnosis not present

## 2017-08-02 DIAGNOSIS — Z79891 Long term (current) use of opiate analgesic: Secondary | ICD-10-CM | POA: Diagnosis not present

## 2017-08-02 DIAGNOSIS — Z7982 Long term (current) use of aspirin: Secondary | ICD-10-CM | POA: Diagnosis not present

## 2017-08-02 DIAGNOSIS — H5462 Unqualified visual loss, left eye, normal vision right eye: Secondary | ICD-10-CM | POA: Diagnosis not present

## 2017-08-02 DIAGNOSIS — G8929 Other chronic pain: Secondary | ICD-10-CM | POA: Diagnosis not present

## 2017-08-02 DIAGNOSIS — K649 Unspecified hemorrhoids: Secondary | ICD-10-CM | POA: Diagnosis not present

## 2017-09-16 DIAGNOSIS — H53032 Strabismic amblyopia, left eye: Secondary | ICD-10-CM | POA: Diagnosis not present

## 2017-09-16 DIAGNOSIS — H25013 Cortical age-related cataract, bilateral: Secondary | ICD-10-CM | POA: Diagnosis not present

## 2017-09-16 DIAGNOSIS — H52223 Regular astigmatism, bilateral: Secondary | ICD-10-CM | POA: Diagnosis not present

## 2017-09-30 ENCOUNTER — Other Ambulatory Visit: Payer: Self-pay | Admitting: Family Medicine

## 2017-09-30 DIAGNOSIS — E785 Hyperlipidemia, unspecified: Secondary | ICD-10-CM

## 2017-10-01 NOTE — Telephone Encounter (Signed)
Last lipid 02/17/16

## 2017-10-30 ENCOUNTER — Other Ambulatory Visit: Payer: Self-pay | Admitting: Family Medicine

## 2017-10-30 DIAGNOSIS — E785 Hyperlipidemia, unspecified: Secondary | ICD-10-CM

## 2017-10-31 DIAGNOSIS — R69 Illness, unspecified: Secondary | ICD-10-CM | POA: Diagnosis not present

## 2017-10-31 NOTE — Telephone Encounter (Signed)
Last lipid 02/17/16

## 2017-12-11 ENCOUNTER — Ambulatory Visit (INDEPENDENT_AMBULATORY_CARE_PROVIDER_SITE_OTHER): Payer: Medicare HMO | Admitting: Family Medicine

## 2017-12-11 ENCOUNTER — Encounter: Payer: Self-pay | Admitting: Family Medicine

## 2017-12-11 VITALS — BP 125/82 | HR 68 | Temp 97.9°F | Ht 66.0 in | Wt 169.4 lb

## 2017-12-11 DIAGNOSIS — Z125 Encounter for screening for malignant neoplasm of prostate: Secondary | ICD-10-CM

## 2017-12-11 DIAGNOSIS — E782 Mixed hyperlipidemia: Secondary | ICD-10-CM

## 2017-12-11 DIAGNOSIS — K429 Umbilical hernia without obstruction or gangrene: Secondary | ICD-10-CM

## 2017-12-11 DIAGNOSIS — N183 Chronic kidney disease, stage 3 unspecified: Secondary | ICD-10-CM

## 2017-12-11 DIAGNOSIS — E663 Overweight: Secondary | ICD-10-CM | POA: Diagnosis not present

## 2017-12-11 DIAGNOSIS — E785 Hyperlipidemia, unspecified: Secondary | ICD-10-CM | POA: Diagnosis not present

## 2017-12-11 MED ORDER — SIMVASTATIN 40 MG PO TABS: 40.0000 mg | ORAL_TABLET | Freq: Every day | ORAL | 3 refills | Status: DC

## 2017-12-11 NOTE — Progress Notes (Signed)
BP 125/82   Pulse 68   Temp 97.9 F (36.6 C) (Oral)   Ht '5\' 6"'  (1.676 m)   Wt 169 lb 6.4 oz (76.8 kg)   BMI 27.34 kg/m    Subjective:    Patient ID: Shawn Cline, male    DOB: 01-15-1951, 67 y.o.   MRN: [id number]  HPI: Shawn Cline is a 67 y.o. male presenting on 12/11/2017 for Annual Exam   HPI Hyperlipidemia Patient is coming in for recheck of his hyperlipidemia. The patient is currently taking simvastatin but has been out of it for 4 days but will do recheck of labs today.. They deny any issues with myalgias or history of liver damage from it. They deny any focal numbness or weakness or chest pain.   Stage III CKD recheck Patient denies any urinary complaints, did have a creatinine 1.3 and a GFR 58 on his last time, something we have been monitoring.  He denies any symptoms at all from any urinary complaints.  We will recheck labs today.  Hernia Patient thinks he has a small hernia around his umbilicus and get that checked.  He denies any pain or constipation or blood in stool or fevers or chills but has had it at least since the beginning of the year this year.  Relevant past medical, surgical, family and social history reviewed and updated as indicated. Interim medical history since our last visit reviewed. Allergies and medications reviewed and updated.  Review of Systems  Constitutional: Negative for chills and fever.  HENT: Negative for ear pain and tinnitus.   Eyes: Negative for visual disturbance.  Respiratory: Negative for cough, shortness of breath and wheezing.   Cardiovascular: Negative for chest pain, palpitations and leg swelling.  Gastrointestinal: Negative for abdominal pain, blood in stool, constipation and diarrhea.  Genitourinary: Negative for dysuria and hematuria.  Musculoskeletal: Negative for back pain, gait problem and myalgias.  Skin: Negative for rash.  Neurological: Negative for dizziness, weakness, numbness and headaches.    Psychiatric/Behavioral: Negative for suicidal ideas.  All other systems reviewed and are negative.   Per HPI unless specifically indicated above   Allergies as of 12/11/2017   No Known Allergies     Medication List        Accurate as of 12/11/17  8:49 AM. Always use your most recent med list.          aspirin EC 81 MG tablet Take 81 mg by mouth daily.   cholecalciferol 1000 units tablet Commonly known as:  VITAMIN D Take 1 tablet (1,000 Units total) by mouth daily.   metroNIDAZOLE 1 % gel Commonly known as:  METROGEL Apply topically daily.   simvastatin 40 MG tablet Commonly known as:  ZOCOR Take 1 tablet (40 mg total) by mouth at bedtime.   traMADol 50 MG tablet Commonly known as:  ULTRAM Take 1-2 tablets (50-100 mg total) by mouth every 12 (twelve) hours as needed.          Objective:    BP 125/82   Pulse 68   Temp 97.9 F (36.6 C) (Oral)   Ht '5\' 6"'  (1.676 m)   Wt 169 lb 6.4 oz (76.8 kg)   BMI 27.34 kg/m   Wt Readings from Last 3 Encounters:  12/11/17 169 lb 6.4 oz (76.8 kg)  06/07/17 165 lb (74.8 kg)  06/01/17 168 lb (76.2 kg)    Physical Exam  Constitutional: He is oriented to person, place, and time. He appears well-developed and well-nourished.  No distress.  Eyes: Conjunctivae are normal. No scleral icterus.  Neck: Neck supple. No thyromegaly present.  Cardiovascular: Normal rate, regular rhythm, normal heart sounds and intact distal pulses.  No murmur heard. Pulmonary/Chest: Effort normal and breath sounds normal. No respiratory distress. He has no wheezes.  Abdominal: Soft. Bowel sounds are normal. He exhibits no distension. There is no tenderness. There is no guarding. A hernia (1 cm umbilical hernia, nontender, nothing bulging out through it at this point) is present.  Musculoskeletal: Normal range of motion. He exhibits no edema.  Lymphadenopathy:    He has no cervical adenopathy.  Neurological: He is alert and oriented to person, place,  and time. Coordination normal.  Skin: Skin is warm and dry. No rash noted. He is not diaphoretic.  Psychiatric: He has a normal mood and affect. His behavior is normal.  Nursing note and vitals reviewed.       Assessment & Plan:   Problem List Items Addressed This Visit      Genitourinary   CKD (chronic kidney disease) stage 3, GFR 30-59 ml/min (HCC)   Relevant Orders   CBC with Differential/Platelet   CMP14+EGFR     Other   Hyperlipidemia - Primary   Relevant Medications   simvastatin (ZOCOR) 40 MG tablet   Other Relevant Orders   Lipid panel   Overweight (BMI 25.0-29.9)    Other Visit Diagnoses    Prostate cancer screening       Relevant Orders   PSA, total and free   Umbilical hernia without obstruction and without gangrene           Follow up plan: Return in about 1 year (around 12/12/2018), or if symptoms worsen or fail to improve, for Recheck labs and cholesterol.  Counseling provided for all of the vaccine components Orders Placed This Encounter  Procedures  . CBC with Differential/Platelet  . CMP14+EGFR  . Lipid panel  . PSA, total and free    Caryl Pina, MD [facility] 12/11/2017, 8:49 AM

## 2017-12-12 LAB — CBC WITH DIFFERENTIAL/PLATELET
Basophils Absolute: 0.1 10*3/uL (ref 0.0–0.2)
Basos: 1 %
EOS (ABSOLUTE): 0.2 10*3/uL (ref 0.0–0.4)
EOS: 3 %
HEMOGLOBIN: 11.9 g/dL — AB (ref 13.0–17.7)
Hematocrit: 35.9 % — ABNORMAL LOW (ref 37.5–51.0)
IMMATURE GRANS (ABS): 0 10*3/uL (ref 0.0–0.1)
IMMATURE GRANULOCYTES: 0 %
LYMPHS: 57 %
Lymphocytes Absolute: 3.5 10*3/uL — ABNORMAL HIGH (ref 0.7–3.1)
MCH: 29.8 pg (ref 26.6–33.0)
MCHC: 33.1 g/dL (ref 31.5–35.7)
MCV: 90 fL (ref 79–97)
MONOCYTES: 13 %
Monocytes Absolute: 0.8 10*3/uL (ref 0.1–0.9)
NEUTROS PCT: 26 %
Neutrophils Absolute: 1.6 10*3/uL (ref 1.4–7.0)
PLATELETS: 115 10*3/uL — AB (ref 150–450)
RBC: 3.99 x10E6/uL — ABNORMAL LOW (ref 4.14–5.80)
RDW: 13.6 % (ref 12.3–15.4)
WBC: 6.1 10*3/uL (ref 3.4–10.8)

## 2017-12-12 LAB — PSA, TOTAL AND FREE
PSA, Free Pct: 60 %
PSA, Free: 0.18 ng/mL
Prostate Specific Ag, Serum: 0.3 ng/mL (ref 0.0–4.0)

## 2017-12-12 LAB — CMP14+EGFR
A/G RATIO: 1.4 (ref 1.2–2.2)
ALT: 13 IU/L (ref 0–44)
AST: 20 IU/L (ref 0–40)
Albumin: 4.5 g/dL (ref 3.6–4.8)
Alkaline Phosphatase: 78 IU/L (ref 39–117)
BUN/Creatinine Ratio: 14 (ref 10–24)
BUN: 21 mg/dL (ref 8–27)
Bilirubin Total: 0.4 mg/dL (ref 0.0–1.2)
CALCIUM: 9.4 mg/dL (ref 8.6–10.2)
CO2: 24 mmol/L (ref 20–29)
Chloride: 101 mmol/L (ref 96–106)
Creatinine, Ser: 1.51 mg/dL — ABNORMAL HIGH (ref 0.76–1.27)
GFR, EST AFRICAN AMERICAN: 55 mL/min/{1.73_m2} — AB (ref >59)
GFR, EST NON AFRICAN AMERICAN: 47 mL/min/{1.73_m2} — AB (ref >59)
Globulin, Total: 3.2 g/dL (ref 1.5–4.5)
Glucose: 101 mg/dL — ABNORMAL HIGH (ref 65–99)
POTASSIUM: 3.9 mmol/L (ref 3.5–5.2)
Sodium: 138 mmol/L (ref 134–144)
TOTAL PROTEIN: 7.7 g/dL (ref 6.0–8.5)

## 2017-12-12 LAB — LIPID PANEL
CHOLESTEROL TOTAL: 145 mg/dL (ref 100–199)
Chol/HDL Ratio: 4.7 ratio (ref 0.0–5.0)
HDL: 31 mg/dL — ABNORMAL LOW (ref >39)
LDL CALC: 90 mg/dL (ref 0–99)
TRIGLYCERIDES: 118 mg/dL (ref 0–149)
VLDL CHOLESTEROL CAL: 24 mg/dL (ref 5–40)

## 2017-12-13 ENCOUNTER — Other Ambulatory Visit: Payer: Self-pay | Admitting: *Deleted

## 2017-12-13 DIAGNOSIS — N183 Chronic kidney disease, stage 3 unspecified: Secondary | ICD-10-CM

## 2018-01-27 ENCOUNTER — Encounter: Payer: Self-pay | Admitting: *Deleted

## 2018-01-27 ENCOUNTER — Ambulatory Visit (INDEPENDENT_AMBULATORY_CARE_PROVIDER_SITE_OTHER): Payer: Medicare HMO | Admitting: *Deleted

## 2018-01-27 VITALS — BP 135/80 | HR 76 | Ht 65.0 in | Wt 169.0 lb

## 2018-01-27 DIAGNOSIS — Z0000 Encounter for general adult medical examination without abnormal findings: Secondary | ICD-10-CM

## 2018-01-27 DIAGNOSIS — Z23 Encounter for immunization: Secondary | ICD-10-CM | POA: Diagnosis not present

## 2018-01-27 NOTE — Progress Notes (Signed)
Subjective:   Shawn Cline is a 67 y.o. male who presents for a Medicare Annual Wellness Visit. Shawn Cline lives at home with his wife. He has one adult daughter and one granddaughter. They live close by and he visits with them often. His mother is still living but she suffers from dementia, although he says that she still remembers him.  He works part time at [employer] in [place] and he enjoys his work and his coworkers. Considering completely retiring next year. He is very active at work and at home. Recently finished a complete kitchen remodel and has started on the bedroom.   Review of Systems    Patient reports that his overall health is unchanged compared to last year.  Cardiac Risk Factors include: advanced age (>58men, >54 women);dyslipidemia;male gender  Musculoskeletal: left shoulder pain. Does not affect daily activities but it does bother him at night some. Taken 2 extra strength Tylenol at night before bed. Was taking diclofenac and that worked well but had to stop due to elev kidney functions.   All other systems negative       Current Medications (verified) Outpatient Encounter Medications as of 01/27/2018  Medication Sig  . acetaminophen (TYLENOL) 500 MG tablet Take 1,000 mg by mouth at bedtime.  Marland Kitchen aspirin EC 81 MG tablet Take 81 mg by mouth daily.  . cholecalciferol (VITAMIN D) 1000 units tablet Take 1 tablet (1,000 Units total) by mouth daily.  . metroNIDAZOLE (METROGEL) 1 % gel Apply topically daily.  . simvastatin (ZOCOR) 40 MG tablet Take 1 tablet (40 mg total) by mouth at bedtime.  . traMADol (ULTRAM) 50 MG tablet Take 1-2 tablets (50-100 mg total) by mouth every 12 (twelve) hours as needed. (Patient not taking: Reported on 01/27/2018)   No facility-administered encounter medications on file as of 01/27/2018.     Allergies (verified) Patient has no known allergies.   History: Past Medical History:  Diagnosis Date  . Hypercholesteremia      Past Surgical History:  Procedure Laterality Date  . HEMORRHOID SURGERY     Family History  Problem Relation Age of Onset  . Atrial fibrillation Mother   . Dementia Mother   . Cancer Father        lung   Social History   Socioeconomic History  . Marital status: Married    Spouse name: Not on file  . Number of children: 1  . Years of education: 41  . Highest education level: Some college, no degree  Occupational History  . Not on file  Social Needs  . Financial resource strain: Not hard at all  . Food insecurity:    Worry: Never true    Inability: Never true  . Transportation needs:    Medical: No    Non-medical: No  Tobacco Use  . Smoking status: Former Smoker    Last attempt to quit: 12/03/1991    Years since quitting: 26.1  . Smokeless tobacco: Never Used  Substance and Sexual Activity  . Alcohol use: No  . Drug use: No  . Sexual activity: Not Currently  Lifestyle  . Physical activity:    Days per week: 0 days    Minutes per session: 0 min  . Stress: Not at all  Relationships  . Social connections:    Talks on phone: More than three times a week    Gets together: More than three times a week    Attends religious service: More than 4 times  per year    Active member of club or organization: No    Attends meetings of clubs or organizations: Never    Relationship status: Married  Other Topics Concern  . Not on file  Social History Narrative  . Not on file    Tobacco Use No.  Clinical Intake:     Pain : No/denies pain     Nutritional Status: BMI 25 -29 Overweight  How often do you need to have someone help you when you read instructions, pamphlets, or other written materials from your doctor or pharmacy?: 1 - Never What is the last grade level you completed in school?: 1 year vocational college     Information entered by :: Chong Sicilian, RN  Activities of Daily Living In your present state of health, do you have any difficulty performing  the following activities: 01/27/2018  Hearing? N  Vision? N  Comment legally blind left eye  Difficulty concentrating or making decisions? N  Walking or climbing stairs? N  Dressing or bathing? N  Doing errands, shopping? N  Preparing Food and eating ? N  Using the Toilet? N  In the past six months, have you accidently leaked urine? N  Do you have problems with loss of bowel control? N  Managing your Medications? N  Managing your Finances? N  Housekeeping or managing your Housekeeping? N  Some recent data might be hidden    Diet Bowl of ice cream at night 2 meals a day Breakfast and a late lunch Eats a combination of eating out and at home 1 diet mountain dews a day, 5 to 8 bottles of water a day  Exercise Current Exercise Habits: The patient does not participate in regular exercise at present(stays busy at home and work), Exercise limited by: None identified    Depression Screen PHQ 2/9 Scores 01/27/2018 12/11/2017 06/01/2017 10/29/2016  PHQ - 2 Score 0 0 0 0     Fall Risk Fall Risk  01/27/2018 12/11/2017 06/01/2017 10/29/2016 02/17/2016  Falls in the past year? No No No No No    Safety Is the patient's home free of loose throw rugs in walkways, pet beds, electrical cords, etc?   yes      Handrails on the stairs?   yes      Adequate lighting?   yes  Patient Care Team: Dettinger, Fransisca Kaufmann, MD as PCP - General (Family Medicine)  Hospitalizations, surgeries, and ER visits in previous 12 months No hospitalizations, ER visits, or surgeries this past year.   Objective:    Today's Vitals   01/27/18 1512  BP: 135/80  Pulse: 76  Weight: 169 lb (76.7 kg)  Height: 5\' 5"  (1.651 m)   Body mass index is 28.12 kg/m.  No flowsheet data found.  Hearing/Vision  No hearing or vision deficits noted during visit.   Cognitive Function: MMSE - Mini Mental State Exam 01/27/2018  Orientation to time 5  Orientation to Place 5  Registration 3  Attention/ Calculation 5    Recall 3  Language- name 2 objects 2  Language- repeat 1  Language- follow 3 step command 3  Language- read & follow direction 1  Write a sentence 1  Copy design 1  Total score 30       Normal Cognitive Function Screening: Yes    Immunizations and Health Maintenance Immunization History  Administered Date(s) Administered  . Influenza, High Dose Seasonal PF 01/27/2018  . Influenza,inj,Quad PF,6+ Mos 06/01/2014, 02/11/2015, 02/17/2016  . Td  07/31/2011  . Zoster Recombinat (Shingrix) 01/27/2018   Health Maintenance Due  Topic Date Due  . Hepatitis C Screening  12/02/1950  . PNA vac Low Risk Adult (1 of 2 - PCV13) 06/02/2016   Health Maintenance  Topic Date Due  . Hepatitis C Screening  1950-10-22  . PNA vac Low Risk Adult (1 of 2 - PCV13) 06/02/2016  . INFLUENZA VACCINE  02/01/2018 (Originally 11/14/2017)  . COLONOSCOPY  03/05/2018  . TETANUS/TDAP  07/30/2021        Assessment:   This is a routine wellness examination for [facility].      Plan:    Goals    . Exercise 150 min/wk Moderate Activity        Health Maintenance Recommendations: Pneumococcal vaccine  Influenza vaccine Colorectal cancer screening Shingles vaccine  Additional Screening Recommendations: Lung: Low Dose CT Chest recommended if Age 78-80 years, 30 pack-year currently smoking OR have quit w/in 15years. Patient does not qualify. Hepatitis C Screening recommended: yes  Today's Orders Orders Placed This Encounter  Procedures  . Varicella-zoster vaccine IM (Shingrix)  . Flu vaccine HIGH DOSE PF  Shingrix and flu given today  Keep f/u with Dettinger, Fransisca Kaufmann, MD and any other specialty appointments you may have Continue current medications Move carefully to avoid falls.  Aim for at least 150 minutes of moderate activity a week.  Reading or puzzles are a good way to exercise your brain Stay connected with friends and family. Social connections are beneficial to your emotional and mental  health.  Due for a colonoscopy next month. F/u with [facility] they do not contact you Nephrology referral still pending. Waiting on [facility] to schedule  I have personally reviewed and noted the following in the patient's chart:   . Medical and social history . Use of alcohol, tobacco or illicit drugs  . Current medications and supplements . Functional ability and status . Nutritional status . Physical activity . Advanced directives . List of other physicians . Hospitalizations, surgeries, and ER visits in previous 12 months . Vitals . Screenings to include cognitive, depression, and falls . Referrals and appointments  In addition, I have reviewed and discussed with patient certain preventive protocols, quality metrics, and best practice recommendations. A written personalized care plan for preventive services as well as general preventive health recommendations were provided to patient.     Chong Sicilian, RN   01/27/2018

## 2018-01-27 NOTE — Patient Instructions (Signed)
Shawn Cline , Thank you for taking time to come for your Medicare Wellness Visit. I appreciate your ongoing commitment to your health goals. Please review the following plan we discussed and let me know if I can assist you in the future.   These are the goals we discussed: Goals    . Exercise 150 min/wk Moderate Activity       This is a list of the screening recommended for you and due dates:  Health Maintenance  Topic Date Due  .  Hepatitis C: One time screening is recommended by Center for Disease Control  (CDC) for  adults born from [birth year] through 1964.   05-Sep-1950  . Pneumonia vaccines (1 of 2 - PCV13) 06/02/2016  . Flu Shot  02/01/2018*  . Colon Cancer Screening  03/05/2018  . Tetanus Vaccine  07/30/2021  *Topic was postponed. The date shown is not the original due date.   Recombinant Zoster (Shingles) Vaccine, RZV: What You Need to Know 1. Why get vaccinated? Shingles (also called herpes zoster, or just zoster) is a painful skin rash, often with blisters. Shingles is caused by the varicella zoster virus, the same virus that causes chickenpox. After you have chickenpox, the virus stays in your body and can cause shingles later in life. You can't catch shingles from another person. However, a person who has never had chickenpox (or chickenpox vaccine) could get chickenpox from someone with shingles. A shingles rash usually appears on one side of the face or body and heals within 2 to 4 weeks. Its main symptom is pain, which can be severe. Other symptoms can include fever, headache, chills and upset stomach. Very rarely, a shingles infection can lead to pneumonia, hearing problems, blindness, brain inflammation (encephalitis), or death. For about 1 person in 5, severe pain can continue even long after the rash has cleared up. This long-lasting pain is called post-herpetic neuralgia (PHN). Shingles is far more common in people 41 years of age and older than in younger people, and the risk  increases with age. It is also more common in people whose immune system is weakened because of a disease such as cancer, or by drugs such as steroids or chemotherapy. At least 1 million people a year in the [place] get shingles. 2. Shingles vaccine (recombinant) Recombinant shingles vaccine was approved by FDA in 2017 for the prevention of shingles. In clinical trials, it was more than 90% effective in preventing shingles. It can also reduce the likelihood of PHN. Two doses, 2 to 6 months apart, are recommended for adults 59 and older. This vaccine is also recommended for people who have already gotten the live shingles vaccine (Zostavax). There is no live virus in this vaccine. 3. Some people should not get this vaccine Tell your vaccine provider if you:  Have any severe, life-threatening allergies. A person who has ever had a life-threatening allergic reaction after a dose of recombinant shingles vaccine, or has a severe allergy to any component of this vaccine, may be advised not to be vaccinated. Ask your health care provider if you want information about vaccine components.  Are pregnant or breastfeeding. There is not much information about use of recombinant shingles vaccine in pregnant or nursing women. Your healthcare provider might recommend delaying vaccination.  Are not feeling well. If you have a mild illness, such as a cold, you can probably get the vaccine today. If you are moderately or severely ill, you should probably wait until you recover. Your doctor  can advise you.  4. Risks of a vaccine reaction With any medicine, including vaccines, there is a chance of reactions. After recombinant shingles vaccination, a person might experience:  Pain, redness, soreness, or swelling at the site of the injection  Headache, muscle aches, fever, shivering, fatigue  In clinical trials, most people got a sore arm with mild or moderate pain after vaccination, and some also had redness  and swelling where they got the shot. Some people felt tired, had muscle pain, a headache, shivering, fever, stomach pain, or nausea. About 1 out of 6 people who got recombinant zoster vaccine experienced side effects that prevented them from doing regular activities. Symptoms went away on their own in about 2 to 3 days. Side effects were more common in younger people. You should still get the second dose of recombinant zoster vaccine even if you had one of these reactions after the first dose. Other things that could happen after this vaccine:  People sometimes faint after medical procedures, including vaccination. Sitting or lying down for about 15 minutes can help prevent fainting and injuries caused by a fall. Tell your provider if you feel dizzy or have vision changes or ringing in the ears.  Some people get shoulder pain that can be more severe and longer-lasting than routine soreness that can follow injections. This happens very rarely.  Any medication can cause a severe allergic reaction. Such reactions to a vaccine are estimated at about 1 in a million doses, and would happen within a few minutes to a few hours after the vaccination. As with any medicine, there is a very remote chance of a vaccine causing a serious injury or death. The safety of vaccines is always being monitored. For more information, visit: http://www.aguilar.org/ 5. What if there is a serious problem? What should I look for?  Look for anything that concerns you, such as signs of a severe allergic reaction, very high fever, or unusual behavior. Signs of a severe allergic reaction can include hives, swelling of the face and throat, difficulty breathing, a fast heartbeat, dizziness, and weakness. These would usually start a few minutes to a few hours after the vaccination. What should I do?  If you think it is a severe allergic reaction or other emergency that can't wait, call 9-1-1 and get to the nearest hospital.  Otherwise, call your health care provider. Afterward, the reaction should be reported to the Vaccine Adverse Event Reporting System (VAERS). Your doctor should file this report, or you can do it yourself through the VAERS web site atwww.vaers.https://www.bray.com/ by calling (570) 695-4762. VAERS does not give medical advice. 6. How can I learn more?  Ask your healthcare provider. He or she can give you the vaccine package insert or suggest other sources of information.  Call your local or state health department.  Contact the Centers for Disease Control and Prevention (CDC): ? Call 828-481-4316 (1-800-CDC-INFO) or ? Visit the CDC's website at http://hunter.com/ CDC Vaccine Information Statement (VIS) Recombinant Zoster Vaccine (05/28/2016) This information is not intended to replace advice given to you by your health care provider. Make sure you discuss any questions you have with your health care provider. Document Released: 06/12/2016 Document Revised: 06/12/2016 Document Reviewed: 06/12/2016 Elsevier Interactive Patient Education  [employer].

## 2018-03-03 DIAGNOSIS — N2581 Secondary hyperparathyroidism of renal origin: Secondary | ICD-10-CM | POA: Diagnosis not present

## 2018-03-03 DIAGNOSIS — Z0000 Encounter for general adult medical examination without abnormal findings: Secondary | ICD-10-CM | POA: Diagnosis not present

## 2018-03-03 DIAGNOSIS — N183 Chronic kidney disease, stage 3 (moderate): Secondary | ICD-10-CM | POA: Diagnosis not present

## 2018-03-03 DIAGNOSIS — D631 Anemia in chronic kidney disease: Secondary | ICD-10-CM | POA: Diagnosis not present

## 2018-03-03 DIAGNOSIS — E785 Hyperlipidemia, unspecified: Secondary | ICD-10-CM | POA: Diagnosis not present

## 2018-03-05 ENCOUNTER — Other Ambulatory Visit: Payer: Self-pay | Admitting: Nephrology

## 2018-03-05 DIAGNOSIS — N183 Chronic kidney disease, stage 3 unspecified: Secondary | ICD-10-CM

## 2018-03-10 DIAGNOSIS — N183 Chronic kidney disease, stage 3 (moderate): Secondary | ICD-10-CM | POA: Diagnosis not present

## 2018-03-11 ENCOUNTER — Ambulatory Visit
Admission: RE | Admit: 2018-03-11 | Discharge: 2018-03-11 | Disposition: A | Discharge status: Home / Self Care | Payer: Medicare HMO | Source: Ambulatory Visit | Attending: Nephrology | Admitting: Nephrology

## 2018-03-11 DIAGNOSIS — N183 Chronic kidney disease, stage 3 unspecified: Secondary | ICD-10-CM

## 2018-04-04 DIAGNOSIS — E785 Hyperlipidemia, unspecified: Secondary | ICD-10-CM | POA: Diagnosis not present

## 2018-04-04 DIAGNOSIS — R161 Splenomegaly, not elsewhere classified: Secondary | ICD-10-CM | POA: Diagnosis not present

## 2018-04-04 DIAGNOSIS — Z0000 Encounter for general adult medical examination without abnormal findings: Secondary | ICD-10-CM | POA: Diagnosis not present

## 2018-04-04 DIAGNOSIS — D631 Anemia in chronic kidney disease: Secondary | ICD-10-CM | POA: Diagnosis not present

## 2018-04-04 DIAGNOSIS — N2581 Secondary hyperparathyroidism of renal origin: Secondary | ICD-10-CM | POA: Diagnosis not present

## 2018-04-04 DIAGNOSIS — D72820 Lymphocytosis (symptomatic): Secondary | ICD-10-CM | POA: Diagnosis not present

## 2018-04-04 DIAGNOSIS — N183 Chronic kidney disease, stage 3 (moderate): Secondary | ICD-10-CM | POA: Diagnosis not present

## 2018-04-04 DIAGNOSIS — E663 Overweight: Secondary | ICD-10-CM | POA: Diagnosis not present

## 2018-04-04 DIAGNOSIS — D696 Thrombocytopenia, unspecified: Secondary | ICD-10-CM | POA: Diagnosis not present

## 2018-04-14 ENCOUNTER — Encounter: Payer: Self-pay | Admitting: Internal Medicine

## 2018-05-09 ENCOUNTER — Inpatient Hospital Stay: Payer: Medicare HMO

## 2018-05-09 ENCOUNTER — Inpatient Hospital Stay: Payer: Medicare HMO | Attending: Internal Medicine | Admitting: Internal Medicine

## 2018-05-09 ENCOUNTER — Telehealth: Payer: Self-pay | Admitting: Internal Medicine

## 2018-05-09 VITALS — BP 128/76 | HR 75 | Temp 97.9°F | Resp 17 | Ht 65.0 in | Wt 171.2 lb

## 2018-05-09 DIAGNOSIS — Z79899 Other long term (current) drug therapy: Secondary | ICD-10-CM

## 2018-05-09 DIAGNOSIS — N289 Disorder of kidney and ureter, unspecified: Secondary | ICD-10-CM

## 2018-05-09 DIAGNOSIS — D649 Anemia, unspecified: Secondary | ICD-10-CM | POA: Insufficient documentation

## 2018-05-09 DIAGNOSIS — D72828 Other elevated white blood cell count: Secondary | ICD-10-CM

## 2018-05-09 DIAGNOSIS — Z806 Family history of leukemia: Secondary | ICD-10-CM | POA: Diagnosis not present

## 2018-05-09 DIAGNOSIS — D638 Anemia in other chronic diseases classified elsewhere: Secondary | ICD-10-CM

## 2018-05-09 DIAGNOSIS — N400 Enlarged prostate without lower urinary tract symptoms: Secondary | ICD-10-CM | POA: Diagnosis not present

## 2018-05-09 DIAGNOSIS — D696 Thrombocytopenia, unspecified: Secondary | ICD-10-CM | POA: Insufficient documentation

## 2018-05-09 DIAGNOSIS — Z7982 Long term (current) use of aspirin: Secondary | ICD-10-CM

## 2018-05-09 DIAGNOSIS — R161 Splenomegaly, not elsewhere classified: Secondary | ICD-10-CM

## 2018-05-09 LAB — CBC WITH DIFFERENTIAL/PLATELET
Abs Immature Granulocytes: 0.01 10*3/uL (ref 0.00–0.07)
Basophils Absolute: 0.1 10*3/uL (ref 0.0–0.1)
Basophils Relative: 1 %
EOS PCT: 3 %
Eosinophils Absolute: 0.2 10*3/uL (ref 0.0–0.5)
HCT: 35 % — ABNORMAL LOW (ref 39.0–52.0)
HEMOGLOBIN: 11.3 g/dL — AB (ref 13.0–17.0)
Immature Granulocytes: 0 %
LYMPHS PCT: 62 %
Lymphs Abs: 4.9 10*3/uL — ABNORMAL HIGH (ref 0.7–4.0)
MCH: 29.8 pg (ref 26.0–34.0)
MCHC: 32.3 g/dL (ref 30.0–36.0)
MCV: 92.3 fL (ref 80.0–100.0)
Monocytes Absolute: 0.6 10*3/uL (ref 0.1–1.0)
Monocytes Relative: 7 %
Neutro Abs: 2.1 10*3/uL (ref 1.7–7.7)
Neutrophils Relative %: 27 %
Platelets: 110 10*3/uL — ABNORMAL LOW (ref 150–400)
RBC: 3.79 MIL/uL — ABNORMAL LOW (ref 4.22–5.81)
RDW: 13.6 % (ref 11.5–15.5)
WBC: 7.9 10*3/uL (ref 4.0–10.5)
nRBC: 0 % (ref 0.0–0.2)

## 2018-05-09 LAB — COMPREHENSIVE METABOLIC PANEL
ALK PHOS: 77 U/L (ref 38–126)
ALT: 12 U/L (ref 0–44)
AST: 17 U/L (ref 15–41)
Albumin: 4.2 g/dL (ref 3.5–5.0)
Anion gap: 9 (ref 5–15)
BUN: 23 mg/dL (ref 8–23)
CALCIUM: 9.6 mg/dL (ref 8.9–10.3)
CO2: 28 mmol/L (ref 22–32)
Chloride: 103 mmol/L (ref 98–111)
Creatinine, Ser: 1.29 mg/dL — ABNORMAL HIGH (ref 0.61–1.24)
GFR calc Af Amer: >60 mL/min (ref >60)
GFR calc non Af Amer: 57 mL/min — ABNORMAL LOW (ref >60)
GLUCOSE: 96 mg/dL (ref 70–99)
Potassium: 4.2 mmol/L (ref 3.5–5.1)
Sodium: 140 mmol/L (ref 135–145)
Total Bilirubin: 0.3 mg/dL (ref 0.3–1.2)
Total Protein: 8.1 g/dL (ref 6.5–8.1)

## 2018-05-09 LAB — VITAMIN B12: Vitamin B-12: 246 pg/mL (ref 180–914)

## 2018-05-09 LAB — APTT: aPTT: 27 seconds (ref 24–36)

## 2018-05-09 LAB — PROTIME-INR
INR: 0.98
PROTHROMBIN TIME: 12.9 s (ref 11.4–15.2)

## 2018-05-09 LAB — LACTATE DEHYDROGENASE: LDH: 196 U/L — AB (ref 98–192)

## 2018-05-09 LAB — FOLATE: Folate: 21.4 ng/mL (ref >5.9)

## 2018-05-09 LAB — FERRITIN: Ferritin: 88 ng/mL (ref 24–336)

## 2018-05-09 NOTE — Telephone Encounter (Signed)
Scheduled appt per 01/24 los. ° °Printed calendar and avs. °

## 2018-05-09 NOTE — Progress Notes (Signed)
Referring Physician:  Luz Brazen, MD  [facility]  Diagnosis Anemia in other chronic diseases classified elsewhere - Plan: CBC with Differential/Platelet, Comprehensive metabolic panel, Lactate dehydrogenase, Protein electrophoresis, serum, Ferritin, Hepatitis B core antibody, total, Hepatitis B surface antibody,qualitative, Hepatitis B surface antigen, Hepatitis C RNA quantitative, Hepatitis panel, acute, HIV Antibody (routine testing w rflx), Methylmalonic acid, serum, Vitamin B12, Folate, Protime-INR, APTT, Flow Cytometry  Thrombocytopenia (HCC) - Plan: CBC with Differential/Platelet, Comprehensive metabolic panel, Lactate dehydrogenase, Protein electrophoresis, serum, Ferritin, Hepatitis B core antibody, total, Hepatitis B surface antibody,qualitative, Hepatitis B surface antigen, Hepatitis C RNA quantitative, Hepatitis panel, acute, HIV Antibody (routine testing w rflx), Methylmalonic acid, serum, Vitamin B12, Folate, Protime-INR, APTT, Flow Cytometry  Other elevated white blood cell (WBC) count - Plan: CBC with Differential/Platelet, Comprehensive metabolic panel, Lactate dehydrogenase, Protein electrophoresis, serum, Ferritin, Hepatitis B core antibody, total, Hepatitis B surface antibody,qualitative, Hepatitis B surface antigen, Hepatitis C RNA quantitative, Hepatitis panel, acute, HIV Antibody (routine testing w rflx), Methylmalonic acid, serum, Vitamin B12, Folate, Protime-INR, APTT, Flow Cytometry  Splenomegaly - Plan: CBC with Differential/Platelet, Comprehensive metabolic panel, Lactate dehydrogenase, Protein electrophoresis, serum, Ferritin, Hepatitis B core antibody, total, Hepatitis B surface antibody,qualitative, Hepatitis B surface antigen, Hepatitis C RNA quantitative, Hepatitis panel, acute, HIV Antibody (routine testing w rflx), Methylmalonic acid, serum, Vitamin B12, Folate, Protime-INR, APTT, Flow Cytometry  Staging Cancer Staging No matching  staging information was found for the patient.  Assessment and Plan:  1.  Lymphocytosis, thrombocytopenia, anemia and splenomegaly.  Pt was undergoing evaluation for renal insufficiency and had renal USN done 03/11/2018 that showed Impression:  Splenomegaly. Spleen measures 17.6 x 18.4 x 6.5 cm, 1100 CC. Medial to the spleen is a soft tissue nodule measuring 3 cm, probable splenule.  He had normal kidneys.    Labs done 12/11/2017 showed WBC 6.1 HB 11.9 plts 115,000.  He had 57% lymphocytes.  Chemistries WNL with K+ 3.9 and norma LFTs.  Pt denies any fevers, chills, night sweats and has noted no adenopathy.  He denies any abdominal distension.    He has a family history of an uncle who died of leukemia.    Pt has remote history of smoking but has not smoked for 40 years.  He has undergone evaluation for enlarged prostate.  He denies any history of hepatitis or liver problems.  Pt is seen today for consultation due to thrombocytopenia, anemia and splenomegaly.    Labs done 05/09/2018 reviewed and showed WBC 7.9 HB 11.3 plts 110,000.  Pt has elevated lymphocyte count of 62% on differential evaluation. No fragmentation noted.  Chemistries WNL with K+ 4.2 and normal LFTs.   PT and PTT WNL at 12.9 and 27.  LDH is 196.  Awaiting results of flow cytometry and anemia labs, Hepatitis and HIV testing . May consider additional imaging pending results.  Pt will RTC in 2 weeks to go over results.  All questions answered and pt and wife expressed understanding of information presented.   2.  Renal Insufficiency.  Pt had renal USN done 02/2018 that showed normal kidneys.  Awaiting SPEP results. Cr 1.29  3.    Enlarged Prostate.  Recent PSA was 0.3.  Follow-up with PCP or urology as directed.   4.  Family history of leukemia. He reports this occurred in a paternal uncle who died of disorder.    5.  Health maintenance.  Pt had colonoscopy 03/05/2008 that showed diverticulosis.  Follow-up with GI as recommended.     [place]  minutes spent with more than 50% spent in counseling and coordination of care and review of records.     HPI:  68 year old male referred for evaluation due to thrombocytopenia, anemia and splenomegaly.  Pt was undergoing evaluation for renal insufficiency and had renal USN done 03/11/2018 that showed Impression:  Splenomegaly. Spleen measures 17.6 x 18.4 x 6.5 cm, 1100 CC. Medial to the spleen is a soft tissue nodule measuring 3 cm, probable splenule.  He had normal kidneys.    Labs done 12/11/2017 showed WBC 6.1 HB 11.9 plts 115,000.  He had 57% lymphocytes.  Chemistries WNL with K+ 3.9 and norma LFTs.  Pt denies any fevers, chills, night sweats and has noted no adenopathy.    He has a family history of an uncle who died of leukemia.    Pt has remote history of smoking but has not smoked for 40 years.  He has undergone evaluation for enlarged prostate.  He denies any history of hepatitis or liver problems.  Pt is seen today for consultation due to thrombocytopenia, anemia and splenomegaly.    Problem List Patient Active Problem List   Diagnosis Date Noted  . Overweight (BMI 25.0-29.9) [E66.3] 12/11/2017  . Rosacea [L71.9] 02/17/2016  . CKD (chronic kidney disease) stage 3, GFR 30-59 ml/min (HCC) [N18.3] 08/05/2015  . Pain in joint, shoulder region [M25.519] 04/28/2015  . Hyperlipidemia [E78.5] 02/01/2015    Past Medical History Past Medical History:  Diagnosis Date  . Hypercholesteremia     Past Surgical History Past Surgical History:  Procedure Laterality Date  . HEMORRHOID SURGERY      Family History Family History  Problem Relation Age of Onset  . Atrial fibrillation Mother   . Dementia Mother   . Cancer Father        lung     Social History  reports that he quit smoking about 26 years ago. He has never used smokeless tobacco. He reports that he does not drink alcohol or use drugs.  Medications  Current Outpatient Medications:  .  acetaminophen (TYLENOL) 500  MG tablet, Take 1,000 mg by mouth at bedtime., Disp: , Rfl:  .  aspirin EC 81 MG tablet, Take 81 mg by mouth daily., Disp: , Rfl:  .  cholecalciferol (VITAMIN D) 1000 units tablet, Take 1 tablet (1,000 Units total) by mouth daily., Disp: 90 tablet, Rfl: 3 .  metroNIDAZOLE (METROGEL) 1 % gel, Apply topically daily., Disp: 60 g, Rfl: 3 .  psyllium (REGULOID) 0.52 g capsule, Take 4 capsules by mouth daily., Disp: , Rfl:  .  simvastatin (ZOCOR) 40 MG tablet, Take 1 tablet (40 mg total) by mouth at bedtime., Disp: 90 tablet, Rfl: 3 .  traMADol (ULTRAM) 50 MG tablet, Take 1-2 tablets (50-100 mg total) by mouth every 12 (twelve) hours as needed. (Patient not taking: Reported on 01/27/2018), Disp: 90 tablet, Rfl: 3  Allergies Patient has no known allergies.  Review of Systems Review of Systems - Oncology ROS negative   Physical Exam  Vitals Wt Readings from Last 3 Encounters:  05/09/18 171 lb 3.2 oz (77.7 kg)  01/27/18 169 lb (76.7 kg)  12/11/17 169 lb 6.4 oz (76.8 kg)   Temp Readings from Last 3 Encounters:  05/09/18 97.9 F (36.6 C) (Oral)  12/11/17 97.9 F (36.6 C) (Oral)  06/07/17 (!) 100.6 F (38.1 C) (Oral)   BP Readings from Last 3 Encounters:  05/09/18 128/76  01/27/18 135/80  12/11/17 125/82   Pulse Readings from Last  3 Encounters:  05/09/18 75  01/27/18 76  12/11/17 68   Constitutional: Well-developed, well-nourished, and in no distress.   HENT: Head: Normocephalic and atraumatic.  Mouth/Throat: No oropharyngeal exudate. Mucosa moist. Eyes: Pupils are equal, round, and reactive to light. Conjunctivae are normal. No scleral icterus.  Neck: Normal range of motion. Neck supple. No JVD present.  Cardiovascular: Normal rate, regular rhythm and normal heart sounds.  Exam reveals no gallop and no friction rub.   No murmur heard. Pulmonary/Chest: Effort normal and breath sounds normal. No respiratory distress. No wheezes.No rales.  Abdominal: Soft. Bowel sounds are normal.  Mild distension noted with spleen tip palpable.  There is no tenderness. There is no guarding.  Musculoskeletal: No edema or tenderness.  Lymphadenopathy: No cervical,axillary or supraclavicular adenopathy.  Neurological: Alert and oriented to person, place, and time. No cranial nerve deficit.  Skin: Skin is warm and dry. No rash noted. No erythema. No pallor.  Psychiatric: Affect and judgment normal.   Labs Appointment on 05/09/2018  Component Date Value Ref Range Status  . WBC 05/09/2018 7.9  4.0 - 10.5 K/uL Final  . RBC 05/09/2018 3.79* 4.22 - 5.81 MIL/uL Final  . Hemoglobin 05/09/2018 11.3* 13.0 - 17.0 g/dL Final  . HCT 05/09/2018 35.0* 39.0 - 52.0 % Final  . MCV 05/09/2018 92.3  80.0 - 100.0 fL Final  . MCH 05/09/2018 29.8  26.0 - 34.0 pg Final  . MCHC 05/09/2018 32.3  30.0 - 36.0 g/dL Final  . RDW 05/09/2018 13.6  11.5 - 15.5 % Final  . Platelets 05/09/2018 110* 150 - 400 K/uL Final  . nRBC 05/09/2018 0.0  0.0 - 0.2 % Final  . Neutrophils Relative % 05/09/2018 27  % Final  . Neutro Abs 05/09/2018 2.1  1.7 - 7.7 K/uL Final  . Lymphocytes Relative 05/09/2018 62  % Final  . Lymphs Abs 05/09/2018 4.9* 0.7 - 4.0 K/uL Final  . Monocytes Relative 05/09/2018 7  % Final  . Monocytes Absolute 05/09/2018 0.6  0.1 - 1.0 K/uL Final  . Eosinophils Relative 05/09/2018 3  % Final  . Eosinophils Absolute 05/09/2018 0.2  0.0 - 0.5 K/uL Final  . Basophils Relative 05/09/2018 1  % Final  . Basophils Absolute 05/09/2018 0.1  0.0 - 0.1 K/uL Final  . Immature Granulocytes 05/09/2018 0  % Final  . Abs Immature Granulocytes 05/09/2018 0.01  0.00 - 0.07 K/uL Final   Performed at [facility], [place] [address] [place], [place] [address]  . Sodium 05/09/2018 140  135 - 145 mmol/L Final  . Potassium 05/09/2018 4.2  3.5 - 5.1 mmol/L Final  . Chloride 05/09/2018 103  98 - 111 mmol/L Final  . CO2 05/09/2018 28  22 - 32 mmol/L Final  . Glucose, Bld 05/09/2018 96  70 - 99 mg/dL Final   . BUN 05/09/2018 23  8 - 23 mg/dL Final  . Creatinine, Ser 05/09/2018 1.29* 0.61 - 1.24 mg/dL Final  . Calcium 05/09/2018 9.6  8.9 - 10.3 mg/dL Final  . Total Protein 05/09/2018 8.1  6.5 - 8.1 g/dL Final  . Albumin 05/09/2018 4.2  3.5 - 5.0 g/dL Final  . AST 05/09/2018 17  15 - 41 U/L Final  . ALT 05/09/2018 12  0 - 44 U/L Final  . Alkaline Phosphatase 05/09/2018 77  38 - 126 U/L Final  . Total Bilirubin 05/09/2018 0.3  0.3 - 1.2 mg/dL Final  . GFR calc non Af Amer 05/09/2018 57* >60 mL/min  Final  . GFR calc Af Amer 05/09/2018 >60  >60 mL/min Final  . Anion gap 05/09/2018 9  5 - 15 Final   Performed at [facility], [place] [address] [place], [place] [address]  . LDH 05/09/2018 196* 98 - 192 U/L Final   Performed at [facility], [place] [address], [place], [place] [address]  . Ferritin 05/09/2018 88  24 - 336 ng/mL Final   Performed at [facility], [place] [address] [place], [place] [address]  . Vitamin B-12 05/09/2018 246  180 - 914 pg/mL Final   Comment: (NOTE) This assay is not validated for testing neonatal or myeloproliferative syndrome specimens for Vitamin B12 levels. Performed at [facility], [place] [address] [place], [place] [address]   . Folate 05/09/2018 21.4  >5.9 ng/mL Final   Performed at [facility], [place] [address] [place], [place] [address]  . Prothrombin Time 05/09/2018 12.9  11.4 - 15.2 seconds Final  . INR 05/09/2018 0.98   Final   Performed at [place] [address] [place], [place] [address]  . aPTT 05/09/2018 27  24 - 36 seconds Final   Performed at [facility], [place] [address] [place], [place] [address]     Pathology Orders Placed This Encounter  Procedures  . CBC with Differential/Platelet    Standing Status:   Future    Number of Occurrences:   1    Standing Expiration Date:   05/10/2019  .  Comprehensive metabolic panel    Standing Status:   Future    Number of Occurrences:   1    Standing Expiration Date:   05/10/2019  . Lactate dehydrogenase    Standing Status:   Future    Number of Occurrences:   1    Standing Expiration Date:   05/10/2019  . Protein electrophoresis, serum    Standing Status:   Future    Number of Occurrences:   1    Standing Expiration Date:   05/10/2019  . Ferritin    Standing Status:   Future    Number of Occurrences:   1    Standing Expiration Date:   05/10/2019  . Hepatitis B core antibody, total    Standing Status:   Future    Number of Occurrences:   1    Standing Expiration Date:   05/10/2019  . Hepatitis B surface antibody,qualitative    Standing Status:   Future    Number of Occurrences:   1    Standing Expiration Date:   05/09/2019  . Hepatitis B surface antigen    Standing Status:   Future    Number of Occurrences:   1    Standing Expiration Date:   05/09/2019  . Hepatitis C RNA quantitative    Standing Status:   Future    Number of Occurrences:   1    Standing Expiration Date:   05/09/2019  . Hepatitis panel, acute    Standing Status:   Future    Number of Occurrences:   1    Standing Expiration Date:   05/09/2019  . HIV Antibody (routine testing w rflx)    Standing Status:   Future    Number of Occurrences:   1    Standing Expiration Date:   05/10/2019  . Methylmalonic acid, serum    Standing Status:   Future    Number of  Occurrences:   1    Standing Expiration Date:   05/09/2019  . Vitamin B12    Standing Status:   Future    Number of Occurrences:   1    Standing Expiration Date:   05/09/2019  . Folate    Standing Status:   Future    Number of Occurrences:   1    Standing Expiration Date:   05/09/2019  . Protime-INR    Standing Status:   Future    Number of Occurrences:   1    Standing Expiration Date:   05/09/2019  . APTT    Standing Status:   Future    Number of Occurrences:   1    Standing Expiration Date:   05/09/2019   . Flow Cytometry    Standing Status:   Future    Number of Occurrences:   1    Standing Expiration Date:   05/10/2019       Zoila Shutter MD

## 2018-05-10 LAB — HEPATITIS PANEL, ACUTE
HCV Ab: 0.1 s/co ratio (ref 0.0–0.9)
Hep A IgM: NEGATIVE
Hep B C IgM: NEGATIVE
Hepatitis B Surface Ag: NEGATIVE

## 2018-05-10 LAB — HEPATITIS B CORE ANTIBODY, TOTAL: Hep B Core Total Ab: NEGATIVE

## 2018-05-10 LAB — HIV ANTIBODY (ROUTINE TESTING W REFLEX): HIV Screen 4th Generation wRfx: NONREACTIVE

## 2018-05-10 LAB — HEPATITIS B SURFACE ANTIBODY,QUALITATIVE: Hep B S Ab: NONREACTIVE

## 2018-05-10 LAB — HEPATITIS B SURFACE ANTIGEN: Hepatitis B Surface Ag: NEGATIVE

## 2018-05-12 LAB — PROTEIN ELECTROPHORESIS, SERUM
A/G Ratio: 1.1 (ref 0.7–1.7)
ALBUMIN ELP: 3.9 g/dL (ref 2.9–4.4)
Alpha-1-Globulin: 0.2 g/dL (ref 0.0–0.4)
Alpha-2-Globulin: 0.8 g/dL (ref 0.4–1.0)
Beta Globulin: 0.8 g/dL (ref 0.7–1.3)
GLOBULIN, TOTAL: 3.5 g/dL (ref 2.2–3.9)
Gamma Globulin: 1.7 g/dL (ref 0.4–1.8)
Total Protein ELP: 7.4 g/dL (ref 6.0–8.5)

## 2018-05-13 LAB — METHYLMALONIC ACID, SERUM: Methylmalonic Acid, Quantitative: 311 nmol/L (ref 0–378)

## 2018-05-14 LAB — FLOW CYTOMETRY

## 2018-05-22 DIAGNOSIS — R69 Illness, unspecified: Secondary | ICD-10-CM | POA: Diagnosis not present

## 2018-05-23 ENCOUNTER — Inpatient Hospital Stay: Payer: Medicare HMO | Attending: Internal Medicine | Admitting: Internal Medicine

## 2018-05-23 ENCOUNTER — Telehealth: Payer: Self-pay

## 2018-05-23 VITALS — BP 128/74 | HR 75 | Temp 98.0°F | Resp 17 | Ht 65.0 in | Wt 171.9 lb

## 2018-05-23 DIAGNOSIS — Z79899 Other long term (current) drug therapy: Secondary | ICD-10-CM | POA: Diagnosis not present

## 2018-05-23 DIAGNOSIS — D649 Anemia, unspecified: Secondary | ICD-10-CM | POA: Diagnosis not present

## 2018-05-23 DIAGNOSIS — R161 Splenomegaly, not elsewhere classified: Secondary | ICD-10-CM | POA: Diagnosis not present

## 2018-05-23 DIAGNOSIS — D72828 Other elevated white blood cell count: Secondary | ICD-10-CM

## 2018-05-23 DIAGNOSIS — Z87891 Personal history of nicotine dependence: Secondary | ICD-10-CM | POA: Diagnosis not present

## 2018-05-23 NOTE — Progress Notes (Signed)
Diagnosis Other elevated white blood cell (WBC) count - Plan: CT CHEST W CONTRAST, CT ABDOMEN PELVIS W CONTRAST, CBC with Differential/Platelet, Comprehensive metabolic panel, Lactate dehydrogenase, Beta 2 microglobulin, serum  Staging Cancer Staging No matching staging information was found for the patient.  Assessment and Plan:  1.  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  Pt was undergoing evaluation for renal insufficiency and had renal USN done 03/11/2018 that showed Impression:  Splenomegaly. Spleen measures 17.6 x 18.4 x 6.5 cm, 1100 CC. Medial to the spleen is a soft tissue nodule measuring 3 cm, probable splenule.  He had normal kidneys.    Labs done 12/11/2017 showed WBC 6.1 HB 11.9 plts 115,000.  He had 57% lymphocytes.  Chemistries WNL with K+ 3.9 and norma LFTs.  Pt denies any fevers, chills, night sweats and has noted no adenopathy.  He denies any abdominal distension.    He has a family history of an uncle who died of leukemia.    Pt has remote history of smoking but has not smoked for 40 years.  He has undergone evaluation for enlarged prostate.  He denies any history of hepatitis or liver problems.    Labs done 05/09/2018 reviewed and showed WBC 7.9 HB 11.3 plts 110,000.  Pt has elevated lymphocyte count of 62% on differential evaluation. No fragmentation noted.  Chemistries WNL with K+ 4.2 and normal LFTs.   PT and PTT WNL at 12.9 and 27.  LDH is 196.  Flow cytometry showed monoclonal B cell population.  Included in differential diagnosis is CLL versus lymphoma.  Pt is set up for CT CAP for further evaluation for evidence of adenopathy.  I have discussed with pt and family if adenopathy is noted on imaging that is amenable to biopsy, pt may be set up for biopsy for definitive diagnosis.  If no significant adenopathy noted he will be set up for bone marrow biopsy for definitive diagnosis.  I have discussed with them natural history of CLL and based on his labs results if CLL  final diagnosis, this is early stage.   Will contact wife Tye Maryland at [phone] once scans reviewed for recommendations regarding additional testing.  All questions answered and pt and family expressed understanding of information presented.   2.  Renal Insufficiency.  Pt had renal USN done 02/2018 that showed normal kidneys.  He has negative SPEP.  Cr 1.29  3.    Enlarged Prostate.  Recent PSA was 0.3.  Follow-up with PCP or urology as directed.   4.  Family history of leukemia. He reports this occurred in a paternal uncle who died of disorder.    5.  Health maintenance.  Pt had colonoscopy 03/05/2008 that showed diverticulosis.  Follow-up with GI as recommended.    25 minutes spent with more than 50% spent in counseling and coordination of care and review of records.    Interval History:  Historical data obtained from note dated 05/09/2018:  68 year old male referred for evaluation due to thrombocytopenia, anemia and splenomegaly.  Pt was undergoing evaluation for renal insufficiency and had renal USN done 03/11/2018 that showed Impression:  Splenomegaly. Spleen measures 17.6 x 18.4 x 6.5 cm, 1100 CC. Medial to the spleen is a soft tissue nodule measuring 3 cm, probable splenule.  He had normal kidneys.    Labs done 12/11/2017 showed WBC 6.1 HB 11.9 plts 115,000.  He had 57% lymphocytes.  Chemistries WNL with K+ 3.9 and norma LFTs.  Pt denies any fevers,  chills, night sweats and has noted no adenopathy.    He has a family history of an uncle who died of leukemia.    Pt has remote history of smoking but has not smoked for 40 years.  He has undergone evaluation for enlarged prostate.  He denies any history of hepatitis or liver problems.    Current Status:  Pt is seen today for follow-up to go over labs.   He is accompanied by family.    Problem List Patient Active Problem List   Diagnosis Date Noted  . Overweight (BMI 25.0-29.9) [E66.3] 12/11/2017  . Rosacea [L71.9] 02/17/2016  . CKD  (chronic kidney disease) stage 3, GFR 30-59 ml/min (HCC) [N18.3] 08/05/2015  . Pain in joint, shoulder region [M25.519] 04/28/2015  . Hyperlipidemia [E78.5] 02/01/2015    Past Medical History Past Medical History:  Diagnosis Date  . Hypercholesteremia     Past Surgical History Past Surgical History:  Procedure Laterality Date  . HEMORRHOID SURGERY      Family History Family History  Problem Relation Age of Onset  . Atrial fibrillation Mother   . Dementia Mother   . Cancer Father        lung     Social History  reports that he quit smoking about 26 years ago. He has never used smokeless tobacco. He reports that he does not drink alcohol or use drugs.  Medications  Current Outpatient Medications:  .  acetaminophen (TYLENOL) 500 MG tablet, Take 1,000 mg by mouth at bedtime., Disp: , Rfl:  .  aspirin EC 81 MG tablet, Take 81 mg by mouth daily., Disp: , Rfl:  .  cholecalciferol (VITAMIN D) 1000 units tablet, Take 1 tablet (1,000 Units total) by mouth daily., Disp: 90 tablet, Rfl: 3 .  metroNIDAZOLE (METROGEL) 1 % gel, Apply topically daily., Disp: 60 g, Rfl: 3 .  psyllium (REGULOID) 0.52 g capsule, Take 4 capsules by mouth daily., Disp: , Rfl:  .  simvastatin (ZOCOR) 40 MG tablet, Take 1 tablet (40 mg total) by mouth at bedtime., Disp: 90 tablet, Rfl: 3 .  traMADol (ULTRAM) 50 MG tablet, Take 1-2 tablets (50-100 mg total) by mouth every 12 (twelve) hours as needed., Disp: 90 tablet, Rfl: 3  Allergies Patient has no known allergies.  Review of Systems Review of Systems - Oncology ROS negative   Physical Exam  Vitals Wt Readings from Last 3 Encounters:  05/23/18 171 lb 14.4 oz (78 kg)  05/09/18 171 lb 3.2 oz (77.7 kg)  01/27/18 169 lb (76.7 kg)   Temp Readings from Last 3 Encounters:  05/23/18 98 F (36.7 C) (Oral)  05/09/18 97.9 F (36.6 C) (Oral)  12/11/17 97.9 F (36.6 C) (Oral)   BP Readings from Last 3 Encounters:  05/23/18 128/74  05/09/18 128/76   01/27/18 135/80   Pulse Readings from Last 3 Encounters:  05/23/18 75  05/09/18 75  01/27/18 76    Constitutional: Well-developed, well-nourished, and in no distress.   HENT: Head: Normocephalic and atraumatic.  Mouth/Throat: No oropharyngeal exudate. Mucosa moist. Eyes: Pupils are equal, round, and reactive to light. Conjunctivae are normal. No scleral icterus.  Neck: Normal range of motion. Neck supple. No JVD present.  Cardiovascular: Normal rate, regular rhythm and normal heart sounds.  Exam reveals no gallop and no friction rub.   No murmur heard. Pulmonary/Chest: Effort normal and breath sounds normal. No respiratory distress. No wheezes.No rales.  Abdominal: Soft. Bowel sounds are normal. No distension. There is no tenderness. There  is no guarding.  Musculoskeletal: No edema or tenderness.  Lymphadenopathy: No cervical, axillary or supraclavicular adenopathy.  Neurological: Alert and oriented to person, place, and time. No cranial nerve deficit.  Skin: Skin is warm and dry. No rash noted. No erythema. No pallor.  Psychiatric: Affect and judgment normal.   Labs No visits with results within 3 Day(s) from this visit.  Latest known visit with results is:  Appointment on 05/09/2018  Component Date Value Ref Range Status  . WBC 05/09/2018 7.9  4.0 - 10.5 K/uL Final  . RBC 05/09/2018 3.79* 4.22 - 5.81 MIL/uL Final  . Hemoglobin 05/09/2018 11.3* 13.0 - 17.0 g/dL Final  . HCT 05/09/2018 35.0* 39.0 - 52.0 % Final  . MCV 05/09/2018 92.3  80.0 - 100.0 fL Final  . MCH 05/09/2018 29.8  26.0 - 34.0 pg Final  . MCHC 05/09/2018 32.3  30.0 - 36.0 g/dL Final  . RDW 05/09/2018 13.6  11.5 - 15.5 % Final  . Platelets 05/09/2018 110* 150 - 400 K/uL Final  . nRBC 05/09/2018 0.0  0.0 - 0.2 % Final  . Neutrophils Relative % 05/09/2018 27  % Final  . Neutro Abs 05/09/2018 2.1  1.7 - 7.7 K/uL Final  . Lymphocytes Relative 05/09/2018 62  % Final  . Lymphs Abs 05/09/2018 4.9* 0.7 - 4.0 K/uL Final   . Monocytes Relative 05/09/2018 7  % Final  . Monocytes Absolute 05/09/2018 0.6  0.1 - 1.0 K/uL Final  . Eosinophils Relative 05/09/2018 3  % Final  . Eosinophils Absolute 05/09/2018 0.2  0.0 - 0.5 K/uL Final  . Basophils Relative 05/09/2018 1  % Final  . Basophils Absolute 05/09/2018 0.1  0.0 - 0.1 K/uL Final  . Immature Granulocytes 05/09/2018 0  % Final  . Abs Immature Granulocytes 05/09/2018 0.01  0.00 - 0.07 K/uL Final   Performed at [facility], [place] [address] [place], [place] [address]  . Sodium 05/09/2018 140  135 - 145 mmol/L Final  . Potassium 05/09/2018 4.2  3.5 - 5.1 mmol/L Final  . Chloride 05/09/2018 103  98 - 111 mmol/L Final  . CO2 05/09/2018 28  22 - 32 mmol/L Final  . Glucose, Bld 05/09/2018 96  70 - 99 mg/dL Final  . BUN 05/09/2018 23  8 - 23 mg/dL Final  . Creatinine, Ser 05/09/2018 1.29* 0.61 - 1.24 mg/dL Final  . Calcium 05/09/2018 9.6  8.9 - 10.3 mg/dL Final  . Total Protein 05/09/2018 8.1  6.5 - 8.1 g/dL Final  . Albumin 05/09/2018 4.2  3.5 - 5.0 g/dL Final  . AST 05/09/2018 17  15 - 41 U/L Final  . ALT 05/09/2018 12  0 - 44 U/L Final  . Alkaline Phosphatase 05/09/2018 77  38 - 126 U/L Final  . Total Bilirubin 05/09/2018 0.3  0.3 - 1.2 mg/dL Final  . GFR calc non Af Amer 05/09/2018 57* >60 mL/min Final  . GFR calc Af Amer 05/09/2018 >60  >60 mL/min Final  . Anion gap 05/09/2018 9  5 - 15 Final   Performed at [facility], [place] [address] [place], [place] [address]  . LDH 05/09/2018 196* 98 - 192 U/L Final   Performed at [facility], [place] [address], [place], [place] [address]  . Total Protein ELP 05/09/2018 7.4  6.0 - 8.5 g/dL Final  . Albumin ELP 05/09/2018 3.9  2.9 - 4.4 g/dL Final  . Alpha-1-Globulin 05/09/2018 0.2  0.0 -  0.4 g/dL Final  . Alpha-2-Globulin 05/09/2018 0.8  0.4 - 1.0 g/dL Final  . Beta Globulin 05/09/2018 0.8  0.7 - 1.3 g/dL Final  . Gamma Globulin 05/09/2018 1.7   0.4 - 1.8 g/dL Final  . M-Spike, % 05/09/2018 Not Observed  Not Observed g/dL Final  . SPE Interp. 05/09/2018 Comment   Final   Comment: (NOTE) The SPE pattern appears essentially unremarkable. Evidence of monoclonal protein is not apparent. Performed At: [facility] [place], [place] [id number] Rush Farmer MD WU:[id number]   . Comment 05/09/2018 Comment   Final   Comment: (NOTE) Protein electrophoresis scan will follow via computer, mail, or courier delivery.   Marland Kitchen GLOBULIN, TOTAL 05/09/2018 3.5  2.2 - 3.9 g/dL Corrected  . A/G Ratio 05/09/2018 1.1  0.7 - 1.7 Corrected  . Ferritin 05/09/2018 88  24 - 336 ng/mL Final   Performed at [facility], [place] [address], [place], [place] [address]  . Hep B Core Total Ab 05/09/2018 Negative  Negative Final   Comment: (NOTE) Performed At: [facility] [place], [place] [id number] Rush Farmer MD [id number]   . Hep B S Ab 05/09/2018 Non Reactive   Final   Comment: (NOTE)              Non Reactive: Inconsistent with immunity,                            less than 10 mIU/mL              Reactive:     Consistent with immunity,                            greater than 9.9 mIU/mL Performed At: [facility] [place], [place] [id number] Rush Farmer MD [id number]   . Hepatitis B Surface Ag 05/09/2018 Negative  Negative Final   Comment: (NOTE) Performed At: [facility] [place], [place] [id number] Rush Farmer MD PI:[id number]   . Hepatitis B Surface Ag 05/09/2018 Negative  Negative Final  . HCV Ab 05/09/2018 0.1  0.0 - 0.9 s/co ratio Final   Comment: (NOTE)                                  Negative:     < 0.8                             Indeterminate: 0.8 - 0.9                                  Positive:     > 0.9 The CDC recommends that a positive HCV antibody result be followed up with a HCV Nucleic Acid  Amplification test (630160). Performed At: [facility] [place], [place] [id number] Rush Farmer MD [id number]   . Hep A IgM 05/09/2018 Negative  Negative Final  . Hep B C IgM 05/09/2018 Negative  Negative Final  . HIV Screen 4th Generation wRfx 05/09/2018 Non Reactive  Non Reactive Final   Comment: (NOTE) Performed At: [facility] [address] [place], [place] [id number] Rush Farmer MD VO:[id number]   . Methylmalonic Acid, Quantitative  05/09/2018 311  0 - 378 nmol/L Final  . Disclaimer: 05/09/2018 Comment   Final   Comment: (NOTE) This test was developed and its performance characteristics determined by [facility]. It has not been cleared or approved by the Food and Drug Administration. Performed At: [facility] [place], [place] [id number] Rush Farmer MD YI:[id number]   . Vitamin B-12 05/09/2018 246  180 - 914 pg/mL Final   Comment: (NOTE) This assay is not validated for testing neonatal or myeloproliferative syndrome specimens for Vitamin B12 levels. Performed at [facility], [place] [address] [place], [place] [address]   . Folate 05/09/2018 21.4  >5.9 ng/mL Final   Performed at [facility], [place] [address], [place], [place] [address]  . Prothrombin Time 05/09/2018 12.9  11.4 - 15.2 seconds Final  . INR 05/09/2018 0.98   Final   Performed at [place] [address], [place], [place] [address]  . aPTT 05/09/2018 27  24 - 36 seconds Final   Performed at [facility], [place] [address], [place], [place] [address]  . Flow Cytometry 05/09/2018 See Scanned report in [place]   Final   Performed at [facility], [address] [place], [place] [address]     Pathology Orders Placed This Encounter  Procedures  . CT CHEST W CONTRAST    Standing Status:   Future    Standing Expiration Date:   05/23/2019     Order Specific Question:   If indicated for the ordered procedure, I authorize the administration of contrast media per Radiology protocol    Answer:   Yes    Order Specific Question:   Preferred imaging location?    Answer:   [facility]    Order Specific Question:   Radiology Contrast Protocol - do NOT remove file path    Answer:   \\[email or web address]  . CT ABDOMEN PELVIS W CONTRAST    Standing Status:   Future    Standing Expiration Date:   05/23/2019    Order Specific Question:   If indicated for the ordered procedure, I authorize the administration of contrast media per Radiology protocol    Answer:   Yes    Order Specific Question:   Preferred imaging location?    Answer:   [facility]    Order Specific Question:   Is Oral Contrast requested for this exam?    Answer:   Yes, Per Radiology protocol    Order Specific Question:   Radiology Contrast Protocol - do NOT remove file path    Answer:   \\[email or web address]  . CBC with Differential/Platelet    Standing Status:   Future    Standing Expiration Date:   05/24/2019  . Comprehensive metabolic panel    Standing Status:   Future    Standing Expiration Date:   05/24/2019  . Lactate dehydrogenase    Standing Status:   Future    Standing Expiration Date:   05/24/2019  . Beta 2 microglobulin, serum    Standing Status:   Future    Standing Expiration Date:   05/24/2019       Zoila Shutter MD

## 2018-05-23 NOTE — Telephone Encounter (Signed)
Printed avs and calender of upcoming appointment. Per 2/7 los 

## 2018-05-29 ENCOUNTER — Telehealth (HOSPITAL_COMMUNITY): Payer: Self-pay | Admitting: Internal Medicine

## 2018-05-29 ENCOUNTER — Ambulatory Visit (HOSPITAL_COMMUNITY)
Admission: RE | Admit: 2018-05-29 | Discharge: 2018-05-29 | Disposition: A | Discharge status: Home / Self Care | Payer: Medicare HMO | Source: Ambulatory Visit | Attending: Internal Medicine | Admitting: Internal Medicine

## 2018-05-29 ENCOUNTER — Other Ambulatory Visit: Payer: Self-pay | Admitting: Internal Medicine

## 2018-05-29 DIAGNOSIS — K5730 Diverticulosis of large intestine without perforation or abscess without bleeding: Secondary | ICD-10-CM | POA: Diagnosis not present

## 2018-05-29 DIAGNOSIS — R591 Generalized enlarged lymph nodes: Secondary | ICD-10-CM

## 2018-05-29 DIAGNOSIS — D72828 Other elevated white blood cell count: Secondary | ICD-10-CM | POA: Diagnosis present

## 2018-05-29 DIAGNOSIS — R161 Splenomegaly, not elsewhere classified: Secondary | ICD-10-CM | POA: Diagnosis not present

## 2018-05-29 DIAGNOSIS — R599 Enlarged lymph nodes, unspecified: Secondary | ICD-10-CM

## 2018-05-29 DIAGNOSIS — D72829 Elevated white blood cell count, unspecified: Secondary | ICD-10-CM | POA: Diagnosis not present

## 2018-05-29 MED ORDER — SODIUM CHLORIDE (PF) 0.9 % IJ SOLN
INTRAMUSCULAR | Status: AC
  Filled 2018-05-29: qty 50

## 2018-05-29 MED ORDER — IOHEXOL 300 MG/ML  SOLN
100.0000 mL | Freq: Once | INTRAMUSCULAR | Status: AC | PRN
  Administered 2018-05-29: 100 mL via INTRAVENOUS

## 2018-05-29 NOTE — Telephone Encounter (Signed)
Spoke with wife Tye [place] about results of scans done today 05/29/2018  showing adenopathy and splenomegaly.  Will ask for IR to review scans for possible core biopsy of LN.  If LN biopsy cannot be done will ask for bone marrow biopsy.  Pt will RTC to go over results of biopsy.  All questions answered and she expressed understanding of information presented.

## 2018-06-01 DIAGNOSIS — E782 Mixed hyperlipidemia: Secondary | ICD-10-CM | POA: Diagnosis not present

## 2018-06-01 DIAGNOSIS — H6123 Impacted cerumen, bilateral: Secondary | ICD-10-CM | POA: Diagnosis not present

## 2018-06-03 ENCOUNTER — Other Ambulatory Visit: Payer: Self-pay | Admitting: Radiology

## 2018-06-04 ENCOUNTER — Other Ambulatory Visit: Payer: Self-pay | Admitting: Radiology

## 2018-06-05 ENCOUNTER — Ambulatory Visit (HOSPITAL_COMMUNITY)
Admission: RE | Admit: 2018-06-05 | Discharge: 2018-06-05 | Disposition: A | Discharge status: Home / Self Care | Payer: Medicare HMO | Source: Ambulatory Visit | Attending: Internal Medicine | Admitting: Internal Medicine

## 2018-06-05 ENCOUNTER — Other Ambulatory Visit: Payer: Self-pay

## 2018-06-05 ENCOUNTER — Encounter (HOSPITAL_COMMUNITY): Payer: Self-pay

## 2018-06-05 DIAGNOSIS — R161 Splenomegaly, not elsewhere classified: Secondary | ICD-10-CM | POA: Diagnosis not present

## 2018-06-05 DIAGNOSIS — R591 Generalized enlarged lymph nodes: Secondary | ICD-10-CM | POA: Insufficient documentation

## 2018-06-05 DIAGNOSIS — C9110 Chronic lymphocytic leukemia of B-cell type not having achieved remission: Secondary | ICD-10-CM | POA: Diagnosis not present

## 2018-06-05 DIAGNOSIS — R590 Localized enlarged lymph nodes: Secondary | ICD-10-CM | POA: Diagnosis not present

## 2018-06-05 DIAGNOSIS — R599 Enlarged lymph nodes, unspecified: Secondary | ICD-10-CM

## 2018-06-05 DIAGNOSIS — C8310 Mantle cell lymphoma, unspecified site: Secondary | ICD-10-CM | POA: Diagnosis not present

## 2018-06-05 LAB — CBC WITH DIFFERENTIAL/PLATELET
Abs Immature Granulocytes: 0.02 10*3/uL (ref 0.00–0.07)
Basophils Absolute: 0.1 10*3/uL (ref 0.0–0.1)
Basophils Relative: 0 %
Eosinophils Absolute: 0.2 10*3/uL (ref 0.0–0.5)
Eosinophils Relative: 2 %
HCT: 37.4 % — ABNORMAL LOW (ref 39.0–52.0)
Hemoglobin: 11.7 g/dL — ABNORMAL LOW (ref 13.0–17.0)
IMMATURE GRANULOCYTES: 0 %
Lymphocytes Relative: 75 %
Lymphs Abs: 8.4 10*3/uL — ABNORMAL HIGH (ref 0.7–4.0)
MCH: 30.2 pg (ref 26.0–34.0)
MCHC: 31.3 g/dL (ref 30.0–36.0)
MCV: 96.4 fL (ref 80.0–100.0)
Monocytes Absolute: 0.7 10*3/uL (ref 0.1–1.0)
Monocytes Relative: 6 %
NEUTROS ABS: 2 10*3/uL (ref 1.7–7.7)
NEUTROS PCT: 17 %
Platelets: 123 10*3/uL — ABNORMAL LOW (ref 150–400)
RBC: 3.88 MIL/uL — ABNORMAL LOW (ref 4.22–5.81)
RDW: 14 % (ref 11.5–15.5)
WBC: 11.4 10*3/uL — ABNORMAL HIGH (ref 4.0–10.5)
nRBC: 0 % (ref 0.0–0.2)

## 2018-06-05 LAB — BASIC METABOLIC PANEL
ANION GAP: 6 (ref 5–15)
BUN: 28 mg/dL — ABNORMAL HIGH (ref 8–23)
CO2: 24 mmol/L (ref 22–32)
Calcium: 9 mg/dL (ref 8.9–10.3)
Chloride: 107 mmol/L (ref 98–111)
Creatinine, Ser: 1.46 mg/dL — ABNORMAL HIGH (ref 0.61–1.24)
GFR calc Af Amer: 57 mL/min — ABNORMAL LOW (ref >60)
GFR calc non Af Amer: 49 mL/min — ABNORMAL LOW (ref >60)
Glucose, Bld: 101 mg/dL — ABNORMAL HIGH (ref 70–99)
Potassium: 4.1 mmol/L (ref 3.5–5.1)
Sodium: 137 mmol/L (ref 135–145)

## 2018-06-05 LAB — PROTIME-INR
INR: 1.01
Prothrombin Time: 13.2 seconds (ref 11.4–15.2)

## 2018-06-05 MED ORDER — FENTANYL CITRATE (PF) 100 MCG/2ML IJ SOLN
INTRAMUSCULAR | Status: AC | PRN
  Administered 2018-06-05 (×2): 50 ug via INTRAVENOUS

## 2018-06-05 MED ORDER — HYDROCODONE-ACETAMINOPHEN 5-325 MG PO TABS: 1.0000 | ORAL_TABLET | ORAL | Status: DC | PRN

## 2018-06-05 MED ORDER — LIDOCAINE HCL (PF) 1 % IJ SOLN
INTRAMUSCULAR | Status: AC | PRN
  Administered 2018-06-05: 10 mL

## 2018-06-05 MED ORDER — SODIUM CHLORIDE 0.9 % IV SOLN
INTRAVENOUS | Status: DC
  Administered 2018-06-05: 08:00:00 via INTRAVENOUS

## 2018-06-05 MED ORDER — MIDAZOLAM HCL 2 MG/2ML IJ SOLN
INTRAMUSCULAR | Status: AC
  Filled 2018-06-05: qty 4

## 2018-06-05 MED ORDER — MIDAZOLAM HCL 2 MG/2ML IJ SOLN
INTRAMUSCULAR | Status: AC | PRN
  Administered 2018-06-05 (×3): 1 mg via INTRAVENOUS

## 2018-06-05 MED ORDER — FENTANYL CITRATE (PF) 100 MCG/2ML IJ SOLN
INTRAMUSCULAR | Status: AC
  Filled 2018-06-05: qty 2

## 2018-06-05 NOTE — Procedures (Signed)
Interventional Radiology Procedure:   Indications: Lymphadenopathy with splenomegaly  Procedure: CT guided retroperitoneal lymph node biopsy  Findings: Multiple cores obtained from left RP lymph node  Complications: None     EBL: Minimal  Plan: Bedrest 2 hours, then discharge to home    Ardythe Klute R. Anselm Pancoast, MD  Pager: [phone]

## 2018-06-05 NOTE — H&P (Signed)
Chief Complaint: Patient was seen in consultation today for retroperitoneal lymphadenopathy.  Referring Physician(s): Higgs,Vetta  Supervising Physician: Markus Daft  Patient Status: [facility] - Out-pt  History of Present Illness: Ermal Haberer is a 68 y.o. male with a past medical history of hypercholesterolemia and CKD stage III. He went to see his PCP in 11/2017 for his annual exam. Findings of annual exam included splenomegaly, thrombocytopenia, anemia, and lymphocytosis, which prompted hematology referral. He saw Dr. Walden Field in 04/2018 and 05/2018 who ordered imaging scans for further evaluation.  CT chest/abdomen/pelvis 05/29/2018: 1. Bulky adenopathy demonstrated within the mediastinum, upper abdomen and retroperitoneum compatible with history of CLL. 2. Marked splenomegaly.  IR requested by Dr. Walden Field for possible image-guided retroperitoneal lymph node biopsy. Patient awake and alert laying in bed with no complaints at this time. Accompanied by wife at bedside. Denies fever, chills, chest pain, dyspnea, abdominal pain, dizziness, or headache.   Past Medical History:  Diagnosis Date  . Hypercholesteremia     Past Surgical History:  Procedure Laterality Date  . HEMORRHOID SURGERY      Allergies: Patient has no known allergies.  Medications: Prior to Admission medications   Medication Sig Start Date End Date Taking? Authorizing Provider  acetaminophen (TYLENOL) 500 MG tablet Take 1,000 mg by mouth at bedtime.   Yes [provider]  aspirin EC 81 MG tablet Take 81 mg by mouth daily.   Yes [provider]  cholecalciferol (VITAMIN D) 1000 units tablet Take 1 tablet (1,000 Units total) by mouth daily. 08/05/15  Yes Timmothy Euler, MD  metroNIDAZOLE (METROGEL) 1 % gel Apply topically daily. 02/17/16  Yes Timmothy Euler, MD  psyllium (REGULOID) 0.52 g capsule Take 4 capsules by mouth daily.   Yes [provider]  simvastatin (ZOCOR) 40 MG tablet  Take 1 tablet (40 mg total) by mouth at bedtime. 12/11/17  Yes Dettinger, Fransisca Kaufmann, MD  traMADol (ULTRAM) 50 MG tablet Take 1-2 tablets (50-100 mg total) by mouth every 12 (twelve) hours as needed. 08/05/15   Timmothy Euler, MD     Family History  Problem Relation Age of Onset  . Atrial fibrillation Mother   . Dementia Mother   . Cancer Father        lung    Social History   Socioeconomic History  . Marital status: Married    Spouse name: Not on file  . Number of children: 1  . Years of education: 12  . Highest education level: Some college, no degree  Occupational History  . Not on file  Social Needs  . Financial resource strain: Not hard at all  . Food insecurity:    Worry: Never true    Inability: Never true  . Transportation needs:    Medical: No    Non-medical: No  Tobacco Use  . Smoking status: Former Smoker    Last attempt to quit: 12/03/1991    Years since quitting: 26.5  . Smokeless tobacco: Never Used  Substance and Sexual Activity  . Alcohol use: No  . Drug use: No  . Sexual activity: Not Currently  Lifestyle  . Physical activity:    Days per week: 0 days    Minutes per session: 0 min  . Stress: Not at all  Relationships  . Social connections:    Talks on phone: More than three times a week    Gets together: More than three times a week    Attends religious service: More than 4  times per year    Active member of club or organization: No    Attends meetings of clubs or organizations: Never    Relationship status: Married  Other Topics Concern  . Not on file  Social History Narrative  . Not on file     Review of Systems: A 12 point ROS discussed and pertinent positives are indicated in the HPI above.  All other systems are negative.  Review of Systems  Constitutional: Negative for chills and fever.  Respiratory: Negative for shortness of breath and wheezing.   Cardiovascular: Negative for chest pain and palpitations.  Gastrointestinal:  Negative for abdominal pain.  Neurological: Negative for dizziness and headaches.  Psychiatric/Behavioral: Negative for behavioral problems and confusion.    Vital Signs: BP 127/79 (BP Location: Right Arm)   Pulse 73   Temp (!) 97.5 F (36.4 C) (Oral)   Resp 16   SpO2 100%   Physical Exam Vitals signs and nursing note reviewed.  Constitutional:      General: He is not in acute distress.    Appearance: Normal appearance.  Cardiovascular:     Rate and Rhythm: Normal rate and regular rhythm.     Heart sounds: Normal heart sounds. No murmur.  Pulmonary:     Effort: Pulmonary effort is normal. No respiratory distress.     Breath sounds: Normal breath sounds. No wheezing.  Skin:    General: Skin is warm and dry.  Neurological:     Mental Status: He is alert and oriented to person, place, and time.  Psychiatric:        Mood and Affect: Mood normal.        Behavior: Behavior normal.        Thought Content: Thought content normal.        Judgment: Judgment normal.      MD Evaluation Airway: WNL Heart: WNL Abdomen: WNL Chest/ Lungs: WNL ASA  Classification: 2 Mallampati/Airway Score: Two   Imaging: Ct Chest W Contrast  Result Date: 05/29/2018 CLINICAL DATA:  Elevated white blood cell count. Initial CLL workup. EXAM: CT CHEST, ABDOMEN, AND PELVIS WITH CONTRAST TECHNIQUE: Multidetector CT imaging of the chest, abdomen and pelvis was performed following the standard protocol during bolus administration of intravenous contrast. CONTRAST:  142mL OMNIPAQUE IOHEXOL 300 MG/ML  SOLN COMPARISON:  None. FINDINGS: CT CHEST FINDINGS Cardiovascular: Normal heart size. Trace fluid superior pericardial recess. Thoracic aortic vascular calcifications. Mediastinum/Nodes: Multiple bulky and enlarged mediastinal lymph nodes and internal mammary lymph nodes are demonstrated. There is a 1.9 cm left internal mammary node (image 30; series 2). A 1.4 cm superior mediastinal (image 20; series 2). A 1.3  cm right para soft Jewel node (image 33; series 2). Normal appearance of the esophagus. Lungs/Pleura: Central airways are patent. Dependent atelectasis within the bilateral lower lobes. Biapical pleuroparenchymal thickening. No large area pulmonary consolidation. No pleural effusion or pneumothorax. Paraseptal emphysematous changes. Musculoskeletal: Thoracic spine degenerative changes. No aggressive or acute appearing osseous lesions. CT ABDOMEN PELVIS FINDINGS Hepatobiliary: Liver is normal in size and contour. No focal lesion identified. Gallbladder is unremarkable. No intrahepatic or extrahepatic biliary ductal dilatation. Pancreas: Unremarkable Spleen: Markedly enlarged measuring 18.4 cm. Adrenals/Urinary Tract: Normal adrenal glands. Kidneys enhance symmetrically with contrast. No hydronephrosis. Stomach/Bowel: Sigmoid colonic diverticulosis. No CT evidence for acute diverticulitis. Oral contrast material within the colon. No evidence for small bowel obstruction. Vascular/Lymphatic: Normal caliber abdominal aorta. Peripheral calcified atherosclerotic plaque. Multiple bulky enlarged upper abdominal and retroperitoneal lymph nodes are demonstrated. There  is a 6.3 x 8.4 cm nodal conglomerate within the gastrohepatic ligament (image 58; series 2). There is a 3.2 cm left periaortic lymph node (image 71; series 2). There is a 2.9 cm aortocaval lymph node (image 75; series 2). Reproductive: Heterogeneous prostate. Other: Small bilateral fat containing inguinal hernias. Musculoskeletal: No aggressive or acute appearing osseous lesions. Lumbar spine degenerative changes. IMPRESSION: Bulky adenopathy demonstrated within the mediastinum, upper abdomen and retroperitoneum compatible with history of CLL. Marked splenomegaly. Electronically Signed   By: Lovey Newcomer M.D.   On: 05/29/2018 12:03   Ct Abdomen Pelvis W Contrast  Result Date: 05/29/2018 CLINICAL DATA:  Elevated white blood cell count. Initial CLL workup. EXAM:  CT CHEST, ABDOMEN, AND PELVIS WITH CONTRAST TECHNIQUE: Multidetector CT imaging of the chest, abdomen and pelvis was performed following the standard protocol during bolus administration of intravenous contrast. CONTRAST:  169mL OMNIPAQUE IOHEXOL 300 MG/ML  SOLN COMPARISON:  None. FINDINGS: CT CHEST FINDINGS Cardiovascular: Normal heart size. Trace fluid superior pericardial recess. Thoracic aortic vascular calcifications. Mediastinum/Nodes: Multiple bulky and enlarged mediastinal lymph nodes and internal mammary lymph nodes are demonstrated. There is a 1.9 cm left internal mammary node (image 30; series 2). A 1.4 cm superior mediastinal (image 20; series 2). A 1.3 cm right para soft Jewel node (image 33; series 2). Normal appearance of the esophagus. Lungs/Pleura: Central airways are patent. Dependent atelectasis within the bilateral lower lobes. Biapical pleuroparenchymal thickening. No large area pulmonary consolidation. No pleural effusion or pneumothorax. Paraseptal emphysematous changes. Musculoskeletal: Thoracic spine degenerative changes. No aggressive or acute appearing osseous lesions. CT ABDOMEN PELVIS FINDINGS Hepatobiliary: Liver is normal in size and contour. No focal lesion identified. Gallbladder is unremarkable. No intrahepatic or extrahepatic biliary ductal dilatation. Pancreas: Unremarkable Spleen: Markedly enlarged measuring 18.4 cm. Adrenals/Urinary Tract: Normal adrenal glands. Kidneys enhance symmetrically with contrast. No hydronephrosis. Stomach/Bowel: Sigmoid colonic diverticulosis. No CT evidence for acute diverticulitis. Oral contrast material within the colon. No evidence for small bowel obstruction. Vascular/Lymphatic: Normal caliber abdominal aorta. Peripheral calcified atherosclerotic plaque. Multiple bulky enlarged upper abdominal and retroperitoneal lymph nodes are demonstrated. There is a 6.3 x 8.4 cm nodal conglomerate within the gastrohepatic ligament (image 58; series 2). There  is a 3.2 cm left periaortic lymph node (image 71; series 2). There is a 2.9 cm aortocaval lymph node (image 75; series 2). Reproductive: Heterogeneous prostate. Other: Small bilateral fat containing inguinal hernias. Musculoskeletal: No aggressive or acute appearing osseous lesions. Lumbar spine degenerative changes. IMPRESSION: Bulky adenopathy demonstrated within the mediastinum, upper abdomen and retroperitoneum compatible with history of CLL. Marked splenomegaly. Electronically Signed   By: Lovey Newcomer M.D.   On: 05/29/2018 12:03    Labs:  CBC: Recent Labs    12/11/17 0852 05/09/18 1024 06/05/18 0730  WBC 6.1 7.9 11.4*  HGB 11.9* 11.3* 11.7*  HCT 35.9* 35.0* 37.4*  PLT 115* 110* 123*    COAGS: Recent Labs    05/09/18 1028 06/05/18 0730  INR 0.98 1.01  APTT 27  --     BMP: Recent Labs    12/11/17 0852 05/09/18 1024 06/05/18 0730  NA 138 140 137  K 3.9 4.2 4.1  CL 101 103 107  CO2 24 28 24   GLUCOSE 101* 96 101*  BUN 21 23 28*  CALCIUM 9.4 9.6 9.0  CREATININE 1.51* 1.29* 1.46*  GFRNONAA 47* 57* 49*  GFRAA 55* >60 57*    LIVER FUNCTION TESTS: Recent Labs    12/11/17 0852 05/09/18 1024  BILITOT 0.4 0.3  AST 20 17  ALT 13 12  ALKPHOS 78 77  PROT 7.7 8.1  ALBUMIN 4.5 4.2    Assessment and Plan:  Retroperitoneal lymphadenopathy. Plan for image-guided retroperitoneal lymph node biopsy today with Dr. Anselm Pancoast. Patient is NPO. Afebrile. He does not take blood thinners. INR 1.01 seconds today.  Risks and benefits discussed with the patient including, but not limited to bleeding, infection, damage to adjacent structures or low yield requiring additional tests. All of the patient's questions were answered, patient is agreeable to proceed. Consent signed and in chart.   Thank you for this interesting consult.  I greatly enjoyed meeting Wren Gallaga and look forward to participating in their care.  A copy of this report was sent to the requesting provider on  this date.  Electronically Signed: Earley Abide, PA-C 06/05/2018, 8:35 AM   I spent a total of 40 Minutes in face to face in clinical consultation, greater than 50% of which was counseling/coordinating care for retroperitoneal lymphadenopathy.

## 2018-06-05 NOTE — Discharge Instructions (Signed)
Moderate Conscious Sedation, Adult, Care After °These instructions provide you with information about caring for yourself after your procedure. Your health care provider may also give you more specific instructions. Your treatment has been planned according to current medical practices, but problems sometimes occur. Call your health care provider if you have any problems or questions after your procedure. °What can I expect after the procedure? °After your procedure, it is common: °· To feel sleepy for several hours. °· To feel clumsy and have poor balance for several hours. °· To have poor judgment for several hours. °· To vomit if you eat too soon. °Follow these instructions at home: °For at least 24 hours after the procedure: ° °· Do not: °? Participate in activities where you could fall or become injured. °? Drive. °? Use heavy machinery. °? Drink alcohol. °? Take sleeping pills or medicines that cause drowsiness. °? Make important decisions or sign legal documents. °? Take care of children on your own. °· Rest. °Eating and drinking °· Follow the diet recommended by your health care provider. °· If you vomit: °? Drink water, juice, or soup when you can drink without vomiting. °? Make sure you have little or no nausea before eating solid foods. °General instructions °· Have a responsible adult stay with you until you are awake and alert. °· Take over-the-counter and prescription medicines only as told by your health care provider. °· If you smoke, do not smoke without supervision. °· Keep all follow-up visits as told by your health care provider. This is important. °Contact a health care provider if: °· You keep feeling nauseous or you keep vomiting. °· You feel light-headed. °· You develop a rash. °· You have a fever. °Get help right away if: °· You have trouble breathing. °This information is not intended to replace advice given to you by your health care provider. Make sure you discuss any questions you have  with your health care provider. °Document Released: 01/21/2013 Document Revised: 09/05/2015 Document Reviewed: 07/23/2015 °Elsevier Interactive Patient Education © 2019 [employer] °Needle Biopsy, Care After °These instructions tell you how to care for yourself after your procedure. Your doctor may also give you more specific instructions. Call your doctor if you have any problems or questions. °What can I expect after the procedure? °After the procedure, it is common to have: °· Soreness. °· Bruising. °· Mild pain. °Follow these instructions at home: ° °· Return to your normal activities as told by your doctor. Ask your doctor what activities are safe for you. °· Take over-the-counter and prescription medicines only as told by your doctor. °· Wash your hands with soap and water before you change your bandage (dressing). If you cannot use soap and water, use hand sanitizer. °· Follow instructions from your doctor about: °? How to take care of your puncture site. °? When and how to change your bandage. °? When to remove your bandage. °· Check your puncture site every day for signs of infection. Watch for: °? Redness, swelling, or pain. °? Fluid or blood.  °? Pus or a bad smell. °? Warmth. °· Do not take baths, swim, or use a hot tub until your doctor approves. Ask your doctor if you may take showers. You may only be allowed to take sponge baths. °· Keep all follow-up visits as told by your doctor. This is important. °Contact a doctor if you have: °· A fever. °· Redness, swelling, or pain at the puncture site, and it lasts longer than a   few days. °· Fluid, blood, or pus coming from the puncture site. °· Warmth coming from the puncture site. °Get help right away if: °· You have a lot of bleeding from the puncture site. °Summary °· After the procedure, it is common to have soreness, bruising, or mild pain at the puncture site. °· Check your puncture site every day for signs of infection, such as redness, swelling, or  pain. °· Get help right away if you have severe bleeding from your puncture site. °This information is not intended to replace advice given to you by your health care provider. Make sure you discuss any questions you have with your health care provider. °Document Released: 03/15/2008 Document Revised: 04/15/2017 Document Reviewed: 04/15/2017 °Elsevier Interactive Patient Education © 2019 Elsevier Inc. ° °

## 2018-06-12 ENCOUNTER — Ambulatory Visit (INDEPENDENT_AMBULATORY_CARE_PROVIDER_SITE_OTHER): Payer: Medicare HMO | Admitting: Family Medicine

## 2018-06-12 ENCOUNTER — Encounter: Payer: Self-pay | Admitting: Family Medicine

## 2018-06-12 ENCOUNTER — Ambulatory Visit (INDEPENDENT_AMBULATORY_CARE_PROVIDER_SITE_OTHER): Payer: Medicare HMO

## 2018-06-12 VITALS — BP 127/81 | HR 82 | Temp 97.1°F | Ht 65.0 in | Wt 168.6 lb

## 2018-06-12 DIAGNOSIS — M25552 Pain in left hip: Secondary | ICD-10-CM

## 2018-06-12 MED ORDER — PREDNISONE 20 MG PO TABS: ORAL_TABLET | ORAL | 0 refills | Status: DC

## 2018-06-12 MED ORDER — METHYLPREDNISOLONE ACETATE 80 MG/ML IJ SUSP
80.0000 mg | Freq: Once | INTRAMUSCULAR | Status: AC
  Administered 2018-06-12: 80 mg via INTRAMUSCULAR

## 2018-06-12 MED ORDER — METHOCARBAMOL 500 MG PO TABS: 500.0000 mg | ORAL_TABLET | Freq: Three times a day (TID) | ORAL | 1 refills | Status: DC | PRN

## 2018-06-12 NOTE — Progress Notes (Signed)
BP 127/81   Pulse 82   Temp (!) 97.1 F (36.2 C) (Oral)   Ht 5\' 5"  (1.651 m)   Wt 168 lb 9.6 oz (76.5 kg)   BMI 28.06 kg/m    Subjective:    Patient ID: Shawn Cline, male    DOB: 1950/11/19, 68 y.o.   MRN: [id number]  HPI: Shawn Cline is a 68 y.o. male presenting on 06/12/2018 for Hip Pain (Left x 3 days)   HPI Left hip pain Patient comes in complaining of left hip pain radiating from the posterior part of the left hip coming around anteriorly over the upper thigh.  This is been going on over the past 3 days.  He had this once as well a month ago but it went away quickly and like a day but this time is to get around longer.  He denies any numbness or weakness.  He did try Robaxin at home and it did help some sleeping but he needs to get back to work and wants to know if something he can do to help with it worse quickly.  Relevant past medical, surgical, family and social history reviewed and updated as indicated. Interim medical history since our last visit reviewed. Allergies and medications reviewed and updated.  Review of Systems  Constitutional: Negative for chills and fever.  Respiratory: Negative for shortness of breath and wheezing.   Cardiovascular: Negative for chest pain and leg swelling.  Musculoskeletal: Positive for arthralgias. Negative for back pain and gait problem.  Skin: Negative for rash.  Neurological: Negative for weakness and numbness.  All other systems reviewed and are negative.   Per HPI unless specifically indicated above   Allergies as of 06/12/2018   No Known Allergies     Medication List       Accurate as of June 12, 2018  1:15 PM. Always use your most recent med list.        acetaminophen 500 MG tablet Commonly known as:  TYLENOL Take 1,000 mg by mouth at bedtime.   aspirin EC 81 MG tablet Take 81 mg by mouth daily.   cholecalciferol 25 MCG (1000 UT) tablet Commonly known as:  VITAMIN D Take 1 tablet (1,000 Units  total) by mouth daily.   metroNIDAZOLE 1 % gel Commonly known as:  METROGEL Apply topically daily.   psyllium 0.52 g capsule Commonly known as:  REGULOID Take 4 capsules by mouth daily.   simvastatin 40 MG tablet Commonly known as:  ZOCOR Take 1 tablet (40 mg total) by mouth at bedtime.   traMADol 50 MG tablet Commonly known as:  ULTRAM Take 1-2 tablets (50-100 mg total) by mouth every 12 (twelve) hours as needed.          Objective:    BP 127/81   Pulse 82   Temp (!) 97.1 F (36.2 C) (Oral)   Ht 5\' 5"  (1.651 m)   Wt 168 lb 9.6 oz (76.5 kg)   BMI 28.06 kg/m   Wt Readings from Last 3 Encounters:  06/12/18 168 lb 9.6 oz (76.5 kg)  05/23/18 171 lb 14.4 oz (78 kg)  05/09/18 171 lb 3.2 oz (77.7 kg)    Physical Exam Vitals signs and nursing note reviewed.  Constitutional:      General: He is not in acute distress.    Appearance: He is well-developed. He is not diaphoretic.  Eyes:     General: No scleral icterus.    Conjunctiva/sclera: Conjunctivae normal.  Neck:  Thyroid: No thyromegaly.  Cardiovascular:     Rate and Rhythm: Normal rate and regular rhythm.     Heart sounds: Normal heart sounds. No murmur.  Pulmonary:     Effort: Pulmonary effort is normal. No respiratory distress.     Breath sounds: Normal breath sounds. No wheezing.  Musculoskeletal: Normal range of motion.     Left hip: He exhibits tenderness (Anterior tenderness, tenderness with flexion rotation). He exhibits normal range of motion, normal strength, no bony tenderness, no crepitus, no deformity and no laceration.  Skin:    General: Skin is warm and dry.     Findings: No rash.  Neurological:     Mental Status: He is alert and oriented to person, place, and time.     Coordination: Coordination normal.  Psychiatric:        Behavior: Behavior normal.     Left hip x-ray: No acute bony abnormalities    Assessment & Plan:   Problem List Items Addressed This Visit    None    Visit  Diagnoses    Acute hip pain, left    -  Primary   Relevant Medications   predniSONE (DELTASONE) 20 MG tablet   methocarbamol (ROBAXIN) 500 MG tablet   methylPREDNISolone acetate (DEPO-MEDROL) injection 80 mg (Start on 06/12/2018  2:15 PM)   Other Relevant Orders   DG HIP UNILAT W OR W/O PELVIS 2-3 VIEWS LEFT       Follow up plan: Return if symptoms worsen or fail to improve.  Counseling provided for all of the vaccine components Orders Placed This Encounter  Procedures  . DG HIP UNILAT W OR W/O PELVIS 2-3 VIEWS LEFT    Caryl Pina, MD [facility] 06/12/2018, 1:15 PM

## 2018-06-13 ENCOUNTER — Other Ambulatory Visit (HOSPITAL_COMMUNITY): Payer: Self-pay

## 2018-06-13 ENCOUNTER — Encounter (HOSPITAL_COMMUNITY): Payer: Self-pay | Admitting: Internal Medicine

## 2018-06-13 ENCOUNTER — Inpatient Hospital Stay (HOSPITAL_COMMUNITY): Payer: Medicare HMO | Attending: Hematology

## 2018-06-13 ENCOUNTER — Ambulatory Visit (HOSPITAL_COMMUNITY): Payer: Self-pay | Admitting: Internal Medicine

## 2018-06-13 ENCOUNTER — Inpatient Hospital Stay (HOSPITAL_COMMUNITY): Payer: Medicare HMO | Admitting: Internal Medicine

## 2018-06-13 ENCOUNTER — Telehealth: Payer: Self-pay | Admitting: Family Medicine

## 2018-06-13 VITALS — BP 138/82 | HR 77 | Temp 97.9°F | Resp 18 | Wt 168.3 lb

## 2018-06-13 DIAGNOSIS — C8318 Mantle cell lymphoma, lymph nodes of multiple sites: Secondary | ICD-10-CM | POA: Diagnosis not present

## 2018-06-13 DIAGNOSIS — R161 Splenomegaly, not elsewhere classified: Secondary | ICD-10-CM | POA: Diagnosis not present

## 2018-06-13 DIAGNOSIS — D72828 Other elevated white blood cell count: Secondary | ICD-10-CM

## 2018-06-13 DIAGNOSIS — Z79899 Other long term (current) drug therapy: Secondary | ICD-10-CM

## 2018-06-13 DIAGNOSIS — N400 Enlarged prostate without lower urinary tract symptoms: Secondary | ICD-10-CM | POA: Insufficient documentation

## 2018-06-13 LAB — COMPREHENSIVE METABOLIC PANEL
ALT: 12 U/L (ref 0–44)
AST: 19 U/L (ref 15–41)
Albumin: 4.6 g/dL (ref 3.5–5.0)
Alkaline Phosphatase: 72 U/L (ref 38–126)
Anion gap: 9 (ref 5–15)
BUN: 27 mg/dL — ABNORMAL HIGH (ref 8–23)
CO2: 27 mmol/L (ref 22–32)
Calcium: 9 mg/dL (ref 8.9–10.3)
Chloride: 102 mmol/L (ref 98–111)
Creatinine, Ser: 1.36 mg/dL — ABNORMAL HIGH (ref 0.61–1.24)
GFR calc Af Amer: >60 mL/min (ref >60)
GFR calc non Af Amer: 53 mL/min — ABNORMAL LOW (ref >60)
Glucose, Bld: 121 mg/dL — ABNORMAL HIGH (ref 70–99)
Potassium: 3.9 mmol/L (ref 3.5–5.1)
Sodium: 138 mmol/L (ref 135–145)
Total Bilirubin: 0.5 mg/dL (ref 0.3–1.2)
Total Protein: 8.3 g/dL — ABNORMAL HIGH (ref 6.5–8.1)

## 2018-06-13 LAB — CBC WITH DIFFERENTIAL/PLATELET
Abs Immature Granulocytes: 0.01 10*3/uL (ref 0.00–0.07)
Basophils Absolute: 0 10*3/uL (ref 0.0–0.1)
Basophils Relative: 0 %
Eosinophils Absolute: 0.1 10*3/uL (ref 0.0–0.5)
Eosinophils Relative: 1 %
HCT: 40.4 % (ref 39.0–52.0)
Hemoglobin: 12.6 g/dL — ABNORMAL LOW (ref 13.0–17.0)
Immature Granulocytes: 0 %
Lymphocytes Relative: 61 %
Lymphs Abs: 5.7 10*3/uL — ABNORMAL HIGH (ref 0.7–4.0)
MCH: 29.2 pg (ref 26.0–34.0)
MCHC: 31.2 g/dL (ref 30.0–36.0)
MCV: 93.7 fL (ref 80.0–100.0)
Monocytes Absolute: 0.6 10*3/uL (ref 0.1–1.0)
Monocytes Relative: 7 %
NEUTROS PCT: 31 %
Neutro Abs: 2.9 10*3/uL (ref 1.7–7.7)
Platelets: 125 10*3/uL — ABNORMAL LOW (ref 150–400)
RBC: 4.31 MIL/uL (ref 4.22–5.81)
RDW: 13.5 % (ref 11.5–15.5)
WBC: 9.3 10*3/uL (ref 4.0–10.5)
nRBC: 0 % (ref 0.0–0.2)

## 2018-06-13 LAB — LACTATE DEHYDROGENASE: LDH: 151 U/L (ref 98–192)

## 2018-06-13 MED ORDER — ALLOPURINOL 100 MG PO TABS: 100.0000 mg | ORAL_TABLET | Freq: Every day | ORAL | 2 refills | Status: DC

## 2018-06-13 MED ORDER — HYDROCODONE-ACETAMINOPHEN 5-325 MG PO TABS: 1.0000 | ORAL_TABLET | Freq: Four times a day (QID) | ORAL | 0 refills | Status: DC | PRN

## 2018-06-13 NOTE — Telephone Encounter (Signed)
Pt aware can start meds today = injection was yesterday

## 2018-06-13 NOTE — Patient Instructions (Signed)
[facility] at [facility] Discharge Instructions  You were seen by Dr. Higgs today   Thank you for choosing [facility] at [facility] to provide your oncology and hematology care.  To afford each patient quality time with our provider, please arrive at least 15 minutes before your scheduled appointment time.   If you have a lab appointment with the Cancer Center please come in thru the  Main Entrance and check in at the main information desk  You need to re-schedule your appointment should you arrive 10 or more minutes late.  We strive to give you quality time with our providers, and arriving late affects you and other patients whose appointments are after yours.  Also, if you no show three or more times for appointments you may be dismissed from the clinic at the providers discretion.     Again, thank you for choosing [facility].  Our hope is that these requests will decrease the amount of time that you wait before being seen by our physicians.       _____________________________________________________________  Should you have questions after your visit to  Cancer Center, please contact our office at [phone] between the hours of 8:00 a.m. and 4:30 p.m.  Voicemails left after 4:00 p.m. will not be returned until the following business day.  For prescription refill requests, have your pharmacy contact our office and allow 72 hours.    Cancer Center Support Programs:   > Cancer Support Group  2nd Tuesday of the month 1pm-2pm, Journey Room   

## 2018-06-13 NOTE — Progress Notes (Signed)
Diagnosis No diagnosis found.  Staging Cancer Staging No matching staging information was found for the patient.  Assessment and Plan:   1. Mantle Cell lymphoma, stage III.  Pt was undergoing evaluation for renal insufficiency and had renal USN done 03/11/2018 that showed Impression:  Splenomegaly. Spleen measures 17.6 x 18.4 x 6.5 cm, 1100 CC. Medial to the spleen is a soft tissue nodule measuring 3 cm, probable splenule.  He had normal kidneys.    Labs done 12/11/2017 showed WBC 6.1 HB 11.9 plts 115,000.  He had 57% lymphocytes.  Chemistries WNL with K+ 3.9 and norma LFTs.  Pt denies any fevers, chills, night sweats and has noted no adenopathy.  He denies any abdominal distension.    He has a family history of an uncle who died of leukemia.    Pt has remote history of smoking but has not smoked for 40 years.  He has undergone evaluation for enlarged prostate.  He denies any history of hepatitis or liver problems.    Labs done 05/09/2018 showed WBC 7.9 HB 11.3 plts 110,000.  Pt has elevated lymphocyte count of 62% on differential evaluation. No fragmentation noted.  Chemistries WNL with K+ 4.2 and normal LFTs.   PT and PTT WNL at 12.9 and 27.  LDH is 196.  Flow cytometry showed monoclonal B cell population.  Included in differential diagnosis is CLL versus lymphoma.    CT CAP for further evaluation was done 05/29/2018 reviewed and showed  IMPRESSION: Bulky adenopathy demonstrated within the mediastinum, upper abdomen and retroperitoneum compatible with history of CLL.  Marked splenomegaly.  In the mediastinum he had evidence of Multiple bulky and enlarged mediastinal lymph nodes and internal mammary lymph nodes are demonstrated. There is a 1.9 cm left internal mammary node (image 30; series 2). A 1.4 cm superior mediastinal (image 20; series 2). A 1.3 cm right para soft Jewel node (image 33; series 2). Normal appearance of the esophagus.  In the abdomen and retroperitoneum there were  Multiple bulky enlarged upper abdominal and retroperitoneal lymph nodes are demonstrated. There is a 6.3 x 8.4 cm nodal conglomerate within the gastrohepatic ligament (image 58; series 2). There is a 3.2 cm left periaortic lymph node (image 71; series 2). There is a 2.9 cm aortocaval lymph node (image 75; series 2).  Spleen was enlarged at 18 cm.    Pt underwent core biopsy of LN on 06/05/2018 with pathology returning as  Lymph node, needle/core biopsy, left - MANTLE CELL LYMPHOMA. - SEE COMMENT. Microscopic Comment The sections show a few small needle core biopsy fragments of lymph nodal tissue displaying effacement of the architecture by a relatively monomorphic population of small to medium sized lymphocytes displaying high nuclear cytoplasmic ratio, round to irregular/angulated nuclei, dense chromatin and small to inconspicuous nuclei. The appearance is generally diffuse with lack of atypical follicles. Flow cytometric analysis was performed ([id number]) and shows a monoclonal, lambda restricted B cell population expressing pan B cell antigens including CD20 associated with CD5 expression. There is patchy weak staining for CD23. No CD10 expression is identified. In addition, immunohistochemical stains were performed and show that the majority of the lymphocytes consist of B cells as seen with CD20 and CD79a, associated with CD5 and cyclin-D1 expression. No significant CD10 expression is identified. There is an admixed T cell population to a lesser extent as primarily highlighted with CD3. The overall histologic and immunophenotypic features are consistent with mantle cell lymphoma. (BNS:gt, 06/09/18)  Awaiting P53, KI 67 and other  prognostic testing.    Long talk held pt pt and family today.  I discussed with him natural history of mantle cell lymphoma.  Based on MIPI score he falls in low risk category.  Labs repeated today 06/13/2018 showed WBC 9.3 HB 12.6 plts 125,000.  He has 61%  lymphocytes.  Chemistries WNL with K+ 3.9 Cr 1.36 and normal LFTs.  LDH 151.  I have discussed with them options of therapy with Bendamustine and Rituxan.  Several randomized studies have shown BR was less toxic and produced superior PFS and similar survival rates.  Bendamustine (90 mg/m2 days 1 and 2) plus rituximab (375 mg/m2 day 1) every 28 days for six cycles suggested that BR resulted in superior median PFS (69.5 versus 31.2 months.)  There was lower rates of grade 3 and 4 neutropenia and leukocytopenia, fewer infections, less paresthesia.  Side effects of the regimen were discussed and pt was presented written information regarding the regimen and NCCN guideline information.  I have discussed depending on response options for Rituxan maintenance every 2 months.   I have spoken with Dr Cassell Clement at [facility] regarding trial options with BMT program based on recent diagnosis of mantle cell lymphoma.    She will see the pt in March for evaluation and trial consideration.  He will undergo PET scan for staging as well as bone marrow biopsy at [facility].    Pt is advised to avoid steroids for now due to potential effects on lymphoma.  He is given a rx for vicodin 5/325 mg po q 4-6 hours with ongoing sciatic pain.  He should notify the office if no improvement in symptoms.  He is started on allopurinol 100 mg po daily.  Pt will RTC after [facility] evaluation.   All questions answered and pt and family expressed understanding of the information presented.    2  Back pain.  Pt reports this occurs primarily from left hip to lateral thigh.  He reports history of sciatica.  He has taken vicodin in the past and reports that works for him.  RX Vicodin 5/325 mg po q 4-6 hours sent to pharmacy.  He should notify the office if no improvement in symptoms.  I have advised him to avoid steroids due to potential effects on lymphoma.    3  RI.  Cr is 1.36 on labs done today 06/13/2018.  Recent SPEP was negative.  Pt is  started on allopurinol 100 mg po daily.  Will continue to monitor chemistries.  Pt had renal USN done 02/2018 that showed normal kidneys.    4.  Enlarged Prostate.  Recent PSA was 0.3.  Follow-up with PCP or urology as directed.   5.  Family history of leukemia. He reports this occurred in a paternal uncle who died of disorder.    6.  Health maintenance.  Pt had colonoscopy 03/05/2008 that showed diverticulosis.  Follow-up with GI as recommended.    Wife Tye Maryland at [phone] is best contact for pt.    30 minutes spent with more than 50% spent in counseling and coordination of care and review of records.    Interval History:  Historical data obtained from note dated 05/09/2018:  68 year old male referred for evaluation due to thrombocytopenia, anemia and splenomegaly.  Pt was undergoing evaluation for renal insufficiency and had renal USN done 03/11/2018 that showed Impression:  Splenomegaly. Spleen measures 17.6 x 18.4 x 6.5 cm, 1100 CC. Medial to the spleen is a soft tissue nodule  measuring 3 cm, probable splenule.  He had normal kidneys.    Labs done 12/11/2017 showed WBC 6.1 HB 11.9 plts 115,000.  He had 57% lymphocytes.  Chemistries WNL with K+ 3.9 and norma LFTs.  Pt denies any fevers, chills, night sweats and has noted no adenopathy.    He has a family history of an uncle who died of leukemia.    Pt has remote history of smoking but has not smoked for 40 years.  He has undergone evaluation for enlarged prostate.  He denies any history of hepatitis or liver problems.    Current Status:  Pt is seen today for follow-up to go over labs and biopsy.   He is accompanied by family.  He reports back pain.  He has history of sciatica.  He was prescribed pulse steroids by PCP but has not taken them yet. He reports he has taken vicodin in the past that helps pain.  He denies fevers, chills, night sweats and has noted no adenopathy.    Problem List Patient Active Problem List   Diagnosis Date Noted   . Overweight (BMI 25.0-29.9) [E66.3] 12/11/2017  . Rosacea [L71.9] 02/17/2016  . CKD (chronic kidney disease) stage 3, GFR 30-59 ml/min (HCC) [N18.3] 08/05/2015  . Pain in joint, shoulder region [M25.519] 04/28/2015  . Hyperlipidemia [E78.5] 02/01/2015    Past Medical History Past Medical History:  Diagnosis Date  . Hypercholesteremia     Past Surgical History Past Surgical History:  Procedure Laterality Date  . HEMORRHOID SURGERY      Family History Family History  Problem Relation Age of Onset  . Atrial fibrillation Mother   . Dementia Mother   . Cancer Father        lung     Social History  reports that he quit smoking about 26 years ago. He has never used smokeless tobacco. He reports that he does not drink alcohol or use drugs.  Medications  Current Outpatient Medications:  .  acetaminophen (TYLENOL) 500 MG tablet, Take 1,000 mg by mouth at bedtime., Disp: , Rfl:  .  allopurinol (ZYLOPRIM) 100 MG tablet, Take 1 tablet (100 mg total) by mouth daily., Disp: 30 tablet, Rfl: 2 .  aspirin EC 81 MG tablet, Take 81 mg by mouth daily., Disp: , Rfl:  .  cholecalciferol (VITAMIN D) 1000 units tablet, Take 1 tablet (1,000 Units total) by mouth daily., Disp: 90 tablet, Rfl: 3 .  HYDROcodone-acetaminophen (NORCO/VICODIN) 5-325 MG tablet, Take 1 tablet by mouth every 6 (six) hours as needed for moderate pain., Disp: 90 tablet, Rfl: 0 .  methocarbamol (ROBAXIN) 500 MG tablet, Take 1 tablet (500 mg total) by mouth every 8 (eight) hours as needed for muscle spasms., Disp: 30 tablet, Rfl: 1 .  metroNIDAZOLE (METROGEL) 1 % gel, Apply topically daily., Disp: 60 g, Rfl: 3 .  predniSONE (DELTASONE) 20 MG tablet, 2 po at same time daily for 5 days, Disp: 10 tablet, Rfl: 0 .  psyllium (REGULOID) 0.52 g capsule, Take 4 capsules by mouth daily., Disp: , Rfl:  .  simvastatin (ZOCOR) 40 MG tablet, Take 1 tablet (40 mg total) by mouth at bedtime., Disp: 90 tablet, Rfl: 3  Allergies Patient  has no known allergies.  Review of Systems Review of Systems - Oncology ROS negative other than back pain   Physical Exam  Vitals Wt Readings from Last 3 Encounters:  06/13/18 168 lb 4.8 oz (76.3 kg)  06/12/18 168 lb 9.6 oz (76.5 kg)  05/23/18  171 lb 14.4 oz (78 kg)   Temp Readings from Last 3 Encounters:  06/13/18 97.9 F (36.6 C) (Oral)  06/12/18 (!) 97.1 F (36.2 C) (Oral)  06/05/18 97.7 F (36.5 C) (Oral)   BP Readings from Last 3 Encounters:  06/13/18 138/82  06/12/18 127/81  06/05/18 101/60   Pulse Readings from Last 3 Encounters:  06/13/18 77  06/12/18 82  06/05/18 66    Constitutional: Well-developed, well-nourished, and in no distress.   HENT: Head: Normocephalic and atraumatic.  Mouth/Throat: No oropharyngeal exudate. Mucosa moist. Eyes: Pupils are equal, round, and reactive to light. Conjunctivae are normal. No scleral icterus.  Neck: Normal range of motion. Neck supple. No JVD present.  Cardiovascular: Normal rate, regular rhythm and normal heart sounds.  Exam reveals no gallop and no friction rub.   No murmur heard. Pulmonary/Chest: Effort normal and breath sounds normal. No respiratory distress. No wheezes.No rales.  Abdominal: Soft. Bowel sounds are normal. No distension. There is no tenderness. There is no guarding.  Musculoskeletal: No edema or tenderness. No percussion tenderness along spine or near biopsy site.   Lymphadenopathy: No cervical, axillary or supraclavicular adenopathy.  Neurological: Alert and oriented to person, place, and time. No cranial nerve deficit.  Skin: Skin is warm and dry. No rash noted. No erythema. No pallor.  Psychiatric: Affect and judgment normal.   Labs Appointment on 06/13/2018  Component Date Value Ref Range Status  . WBC 06/13/2018 9.3  4.0 - 10.5 K/uL Final  . RBC 06/13/2018 4.31  4.22 - 5.81 MIL/uL Final  . Hemoglobin 06/13/2018 12.6* 13.0 - 17.0 g/dL Final  . HCT 06/13/2018 40.4  39.0 - 52.0 % Final  . MCV  06/13/2018 93.7  80.0 - 100.0 fL Final  . MCH 06/13/2018 29.2  26.0 - 34.0 pg Final  . MCHC 06/13/2018 31.2  30.0 - 36.0 g/dL Final  . RDW 06/13/2018 13.5  11.5 - 15.5 % Final  . Platelets 06/13/2018 125* 150 - 400 K/uL Final   Comment: Immature Platelet Fraction may be clinically indicated, consider ordering this additional test [id number]   . nRBC 06/13/2018 0.0  0.0 - 0.2 % Final  . Neutrophils Relative % 06/13/2018 31  % Final  . Neutro Abs 06/13/2018 2.9  1.7 - 7.7 K/uL Final  . Lymphocytes Relative 06/13/2018 61  % Final  . Lymphs Abs 06/13/2018 5.7* 0.7 - 4.0 K/uL Final  . Monocytes Relative 06/13/2018 7  % Final  . Monocytes Absolute 06/13/2018 0.6  0.1 - 1.0 K/uL Final  . Eosinophils Relative 06/13/2018 1  % Final  . Eosinophils Absolute 06/13/2018 0.1  0.0 - 0.5 K/uL Final  . Basophils Relative 06/13/2018 0  % Final  . Basophils Absolute 06/13/2018 0.0  0.0 - 0.1 K/uL Final  . Immature Granulocytes 06/13/2018 0  % Final  . Abs Immature Granulocytes 06/13/2018 0.01  0.00 - 0.07 K/uL Final   Performed at [facility], [address] [place], [place] [address]  . Sodium 06/13/2018 138  135 - 145 mmol/L Final  . Potassium 06/13/2018 3.9  3.5 - 5.1 mmol/L Final  . Chloride 06/13/2018 102  98 - 111 mmol/L Final  . CO2 06/13/2018 27  22 - 32 mmol/L Final  . Glucose, Bld 06/13/2018 121* 70 - 99 mg/dL Final  . BUN 06/13/2018 27* 8 - 23 mg/dL Final  . Creatinine, Ser 06/13/2018 1.36* 0.61 - 1.24 mg/dL Final  . Calcium 06/13/2018 9.0  8.9 - 10.3 mg/dL Final  .  Total Protein 06/13/2018 8.3* 6.5 - 8.1 g/dL Final  . Albumin 06/13/2018 4.6  3.5 - 5.0 g/dL Final  . AST 06/13/2018 19  15 - 41 U/L Final  . ALT 06/13/2018 12  0 - 44 U/L Final  . Alkaline Phosphatase 06/13/2018 72  38 - 126 U/L Final  . Total Bilirubin 06/13/2018 0.5  0.3 - 1.2 mg/dL Final  . GFR calc non Af Amer 06/13/2018 53* >60 mL/min Final  . GFR calc Af Amer 06/13/2018 >60  >60 mL/min Final  . Anion gap 06/13/2018 9   5 - 15 Final   Performed at [facility], [address], [place],  [place] [address]  . LDH 06/13/2018 151  98 - 192 U/L Final   Performed at [facility], [address], [place], [place] [address]     Pathology No orders of the defined types were placed in this encounter.      Zoila Shutter MD

## 2018-06-14 LAB — BETA 2 MICROGLOBULIN, SERUM: Beta-2 Microglobulin: 4.2 mg/L — ABNORMAL HIGH (ref 0.6–2.4)

## 2018-06-18 ENCOUNTER — Encounter: Payer: Self-pay | Admitting: Internal Medicine

## 2018-06-19 ENCOUNTER — Other Ambulatory Visit: Payer: Self-pay

## 2018-06-19 ENCOUNTER — Ambulatory Visit: Payer: Self-pay | Admitting: Internal Medicine

## 2018-06-19 DIAGNOSIS — C8310 Mantle cell lymphoma, unspecified site: Secondary | ICD-10-CM | POA: Diagnosis not present

## 2018-06-20 ENCOUNTER — Other Ambulatory Visit: Payer: Self-pay

## 2018-06-20 ENCOUNTER — Ambulatory Visit: Payer: Self-pay | Admitting: Internal Medicine

## 2018-06-25 DIAGNOSIS — C8318 Mantle cell lymphoma, lymph nodes of multiple sites: Secondary | ICD-10-CM | POA: Diagnosis not present

## 2018-06-25 DIAGNOSIS — C8310 Mantle cell lymphoma, unspecified site: Secondary | ICD-10-CM | POA: Diagnosis not present

## 2018-06-25 DIAGNOSIS — C8319 Mantle cell lymphoma, extranodal and solid organ sites: Secondary | ICD-10-CM | POA: Diagnosis not present

## 2018-06-26 DIAGNOSIS — C8318 Mantle cell lymphoma, lymph nodes of multiple sites: Secondary | ICD-10-CM | POA: Diagnosis not present

## 2018-06-28 DIAGNOSIS — C8318 Mantle cell lymphoma, lymph nodes of multiple sites: Secondary | ICD-10-CM | POA: Diagnosis not present

## 2018-07-01 ENCOUNTER — Telehealth: Payer: Self-pay | Admitting: Internal Medicine

## 2018-07-01 NOTE — Telephone Encounter (Signed)
Spoke with Dr. Cassell Clement today.  Pt being looked at for transplant evaluation. Pt will meet with her tomorrow to discuss protocol.  I have spoken with wife Tye Maryland regarding my discussion and they will follow-up at [facility] tomorrow.  We are available to co-mange pt with supportive therapy and transfusions as needed.  Wife expressed understanding of information presented.

## 2018-07-02 ENCOUNTER — Encounter (HOSPITAL_COMMUNITY): Payer: Self-pay

## 2018-07-02 DIAGNOSIS — C8318 Mantle cell lymphoma, lymph nodes of multiple sites: Secondary | ICD-10-CM | POA: Diagnosis not present

## 2018-07-09 ENCOUNTER — Encounter (HOSPITAL_COMMUNITY): Payer: Self-pay | Admitting: Hematology and Oncology

## 2018-07-09 DIAGNOSIS — N189 Chronic kidney disease, unspecified: Secondary | ICD-10-CM | POA: Diagnosis not present

## 2018-07-09 DIAGNOSIS — Z8052 Family history of malignant neoplasm of bladder: Secondary | ICD-10-CM | POA: Diagnosis not present

## 2018-07-09 DIAGNOSIS — C8310 Mantle cell lymphoma, unspecified site: Secondary | ICD-10-CM | POA: Diagnosis not present

## 2018-07-09 DIAGNOSIS — D6959 Other secondary thrombocytopenia: Secondary | ICD-10-CM | POA: Diagnosis not present

## 2018-07-09 DIAGNOSIS — M25519 Pain in unspecified shoulder: Secondary | ICD-10-CM | POA: Diagnosis not present

## 2018-07-09 DIAGNOSIS — G8929 Other chronic pain: Secondary | ICD-10-CM | POA: Diagnosis not present

## 2018-07-09 DIAGNOSIS — Z79899 Other long term (current) drug therapy: Secondary | ICD-10-CM | POA: Diagnosis not present

## 2018-07-09 DIAGNOSIS — D630 Anemia in neoplastic disease: Secondary | ICD-10-CM | POA: Diagnosis not present

## 2018-07-09 DIAGNOSIS — Z5111 Encounter for antineoplastic chemotherapy: Secondary | ICD-10-CM | POA: Diagnosis not present

## 2018-07-09 DIAGNOSIS — E785 Hyperlipidemia, unspecified: Secondary | ICD-10-CM | POA: Diagnosis not present

## 2018-07-09 DIAGNOSIS — C8318 Mantle cell lymphoma, lymph nodes of multiple sites: Secondary | ICD-10-CM | POA: Diagnosis not present

## 2018-07-09 DIAGNOSIS — M5430 Sciatica, unspecified side: Secondary | ICD-10-CM | POA: Diagnosis not present

## 2018-07-12 DIAGNOSIS — C8310 Mantle cell lymphoma, unspecified site: Secondary | ICD-10-CM | POA: Diagnosis not present

## 2018-07-15 DIAGNOSIS — C8318 Mantle cell lymphoma, lymph nodes of multiple sites: Secondary | ICD-10-CM | POA: Diagnosis not present

## 2018-07-18 DIAGNOSIS — C8310 Mantle cell lymphoma, unspecified site: Secondary | ICD-10-CM | POA: Diagnosis not present

## 2018-07-21 DIAGNOSIS — C8310 Mantle cell lymphoma, unspecified site: Secondary | ICD-10-CM | POA: Diagnosis not present

## 2018-07-25 DIAGNOSIS — C8310 Mantle cell lymphoma, unspecified site: Secondary | ICD-10-CM | POA: Diagnosis not present

## 2018-07-30 DIAGNOSIS — K219 Gastro-esophageal reflux disease without esophagitis: Secondary | ICD-10-CM | POA: Diagnosis not present

## 2018-07-30 DIAGNOSIS — C8310 Mantle cell lymphoma, unspecified site: Secondary | ICD-10-CM | POA: Diagnosis not present

## 2018-07-30 DIAGNOSIS — D6959 Other secondary thrombocytopenia: Secondary | ICD-10-CM | POA: Diagnosis not present

## 2018-07-30 DIAGNOSIS — D6481 Anemia due to antineoplastic chemotherapy: Secondary | ICD-10-CM | POA: Diagnosis not present

## 2018-07-30 DIAGNOSIS — M5430 Sciatica, unspecified side: Secondary | ICD-10-CM | POA: Diagnosis not present

## 2018-07-30 DIAGNOSIS — Z807 Family history of other malignant neoplasms of lymphoid, hematopoietic and related tissues: Secondary | ICD-10-CM | POA: Diagnosis not present

## 2018-07-30 DIAGNOSIS — Z8052 Family history of malignant neoplasm of bladder: Secondary | ICD-10-CM | POA: Diagnosis not present

## 2018-07-30 DIAGNOSIS — N189 Chronic kidney disease, unspecified: Secondary | ICD-10-CM | POA: Diagnosis not present

## 2018-07-30 DIAGNOSIS — Z806 Family history of leukemia: Secondary | ICD-10-CM | POA: Diagnosis not present

## 2018-07-30 DIAGNOSIS — Z5111 Encounter for antineoplastic chemotherapy: Secondary | ICD-10-CM | POA: Diagnosis not present

## 2018-07-30 DIAGNOSIS — D696 Thrombocytopenia, unspecified: Secondary | ICD-10-CM | POA: Diagnosis not present

## 2018-07-30 DIAGNOSIS — E785 Hyperlipidemia, unspecified: Secondary | ICD-10-CM | POA: Diagnosis not present

## 2018-07-30 DIAGNOSIS — Z86718 Personal history of other venous thrombosis and embolism: Secondary | ICD-10-CM | POA: Diagnosis not present

## 2018-08-01 DIAGNOSIS — C8310 Mantle cell lymphoma, unspecified site: Secondary | ICD-10-CM | POA: Diagnosis not present

## 2018-08-05 DIAGNOSIS — C8310 Mantle cell lymphoma, unspecified site: Secondary | ICD-10-CM | POA: Diagnosis not present

## 2018-08-08 DIAGNOSIS — C8310 Mantle cell lymphoma, unspecified site: Secondary | ICD-10-CM | POA: Diagnosis not present

## 2018-08-12 DIAGNOSIS — C8318 Mantle cell lymphoma, lymph nodes of multiple sites: Secondary | ICD-10-CM | POA: Diagnosis not present

## 2018-08-15 DIAGNOSIS — C8318 Mantle cell lymphoma, lymph nodes of multiple sites: Secondary | ICD-10-CM | POA: Diagnosis not present

## 2018-08-20 DIAGNOSIS — E785 Hyperlipidemia, unspecified: Secondary | ICD-10-CM | POA: Diagnosis not present

## 2018-08-20 DIAGNOSIS — I209 Angina pectoris, unspecified: Secondary | ICD-10-CM | POA: Diagnosis not present

## 2018-08-20 DIAGNOSIS — Z5112 Encounter for antineoplastic immunotherapy: Secondary | ICD-10-CM | POA: Diagnosis not present

## 2018-08-20 DIAGNOSIS — R05 Cough: Secondary | ICD-10-CM | POA: Diagnosis not present

## 2018-08-20 DIAGNOSIS — D6481 Anemia due to antineoplastic chemotherapy: Secondary | ICD-10-CM | POA: Diagnosis not present

## 2018-08-20 DIAGNOSIS — Z5111 Encounter for antineoplastic chemotherapy: Secondary | ICD-10-CM | POA: Diagnosis not present

## 2018-08-20 DIAGNOSIS — Z87891 Personal history of nicotine dependence: Secondary | ICD-10-CM | POA: Diagnosis not present

## 2018-08-20 DIAGNOSIS — C8313 Mantle cell lymphoma, intra-abdominal lymph nodes: Secondary | ICD-10-CM | POA: Diagnosis not present

## 2018-08-20 DIAGNOSIS — T451X5A Adverse effect of antineoplastic and immunosuppressive drugs, initial encounter: Secondary | ICD-10-CM | POA: Diagnosis not present

## 2018-08-20 DIAGNOSIS — M5430 Sciatica, unspecified side: Secondary | ICD-10-CM | POA: Diagnosis not present

## 2018-08-20 DIAGNOSIS — C8310 Mantle cell lymphoma, unspecified site: Secondary | ICD-10-CM | POA: Diagnosis not present

## 2018-08-20 DIAGNOSIS — N189 Chronic kidney disease, unspecified: Secondary | ICD-10-CM | POA: Diagnosis not present

## 2018-08-20 DIAGNOSIS — Z95828 Presence of other vascular implants and grafts: Secondary | ICD-10-CM | POA: Diagnosis not present

## 2018-08-20 DIAGNOSIS — I248 Other forms of acute ischemic heart disease: Secondary | ICD-10-CM | POA: Diagnosis not present

## 2018-08-22 DIAGNOSIS — C8310 Mantle cell lymphoma, unspecified site: Secondary | ICD-10-CM | POA: Diagnosis not present

## 2018-08-23 DIAGNOSIS — R110 Nausea: Secondary | ICD-10-CM | POA: Diagnosis not present

## 2018-08-23 DIAGNOSIS — E785 Hyperlipidemia, unspecified: Secondary | ICD-10-CM | POA: Diagnosis not present

## 2018-08-23 DIAGNOSIS — N189 Chronic kidney disease, unspecified: Secondary | ICD-10-CM | POA: Diagnosis not present

## 2018-08-23 DIAGNOSIS — M2550 Pain in unspecified joint: Secondary | ICD-10-CM | POA: Diagnosis not present

## 2018-08-23 DIAGNOSIS — G8929 Other chronic pain: Secondary | ICD-10-CM | POA: Diagnosis not present

## 2018-08-23 DIAGNOSIS — H04129 Dry eye syndrome of unspecified lacrimal gland: Secondary | ICD-10-CM | POA: Diagnosis not present

## 2018-08-23 DIAGNOSIS — M5430 Sciatica, unspecified side: Secondary | ICD-10-CM | POA: Diagnosis not present

## 2018-08-23 DIAGNOSIS — J302 Other seasonal allergic rhinitis: Secondary | ICD-10-CM | POA: Diagnosis not present

## 2018-08-23 DIAGNOSIS — C8590 Non-Hodgkin lymphoma, unspecified, unspecified site: Secondary | ICD-10-CM | POA: Diagnosis not present

## 2018-08-23 DIAGNOSIS — K5900 Constipation, unspecified: Secondary | ICD-10-CM | POA: Diagnosis not present

## 2018-08-26 DIAGNOSIS — C8310 Mantle cell lymphoma, unspecified site: Secondary | ICD-10-CM | POA: Diagnosis not present

## 2018-08-29 DIAGNOSIS — C8310 Mantle cell lymphoma, unspecified site: Secondary | ICD-10-CM | POA: Diagnosis not present

## 2018-09-02 DIAGNOSIS — C8310 Mantle cell lymphoma, unspecified site: Secondary | ICD-10-CM | POA: Diagnosis not present

## 2018-09-05 DIAGNOSIS — C8310 Mantle cell lymphoma, unspecified site: Secondary | ICD-10-CM | POA: Diagnosis not present

## 2018-09-05 DIAGNOSIS — C8318 Mantle cell lymphoma, lymph nodes of multiple sites: Secondary | ICD-10-CM | POA: Diagnosis not present

## 2018-09-10 DIAGNOSIS — Z5111 Encounter for antineoplastic chemotherapy: Secondary | ICD-10-CM | POA: Diagnosis not present

## 2018-09-10 DIAGNOSIS — E785 Hyperlipidemia, unspecified: Secondary | ICD-10-CM | POA: Diagnosis not present

## 2018-09-10 DIAGNOSIS — C8310 Mantle cell lymphoma, unspecified site: Secondary | ICD-10-CM | POA: Diagnosis not present

## 2018-09-10 DIAGNOSIS — M5430 Sciatica, unspecified side: Secondary | ICD-10-CM | POA: Diagnosis not present

## 2018-09-11 DIAGNOSIS — C8310 Mantle cell lymphoma, unspecified site: Secondary | ICD-10-CM | POA: Diagnosis not present

## 2018-09-11 DIAGNOSIS — D6481 Anemia due to antineoplastic chemotherapy: Secondary | ICD-10-CM | POA: Diagnosis not present

## 2018-09-11 DIAGNOSIS — M5430 Sciatica, unspecified side: Secondary | ICD-10-CM | POA: Diagnosis not present

## 2018-09-11 DIAGNOSIS — Z5111 Encounter for antineoplastic chemotherapy: Secondary | ICD-10-CM | POA: Diagnosis not present

## 2018-09-12 DIAGNOSIS — C8310 Mantle cell lymphoma, unspecified site: Secondary | ICD-10-CM | POA: Diagnosis not present

## 2018-09-16 DIAGNOSIS — C8310 Mantle cell lymphoma, unspecified site: Secondary | ICD-10-CM | POA: Diagnosis not present

## 2018-09-19 DIAGNOSIS — C8310 Mantle cell lymphoma, unspecified site: Secondary | ICD-10-CM | POA: Diagnosis not present

## 2018-09-23 DIAGNOSIS — C8310 Mantle cell lymphoma, unspecified site: Secondary | ICD-10-CM | POA: Diagnosis not present

## 2018-09-26 DIAGNOSIS — C8310 Mantle cell lymphoma, unspecified site: Secondary | ICD-10-CM | POA: Diagnosis not present

## 2018-10-01 DIAGNOSIS — R530 Neoplastic (malignant) related fatigue: Secondary | ICD-10-CM | POA: Diagnosis not present

## 2018-10-01 DIAGNOSIS — C8310 Mantle cell lymphoma, unspecified site: Secondary | ICD-10-CM | POA: Diagnosis not present

## 2018-10-01 DIAGNOSIS — R0609 Other forms of dyspnea: Secondary | ICD-10-CM | POA: Diagnosis not present

## 2018-10-01 DIAGNOSIS — C8318 Mantle cell lymphoma, lymph nodes of multiple sites: Secondary | ICD-10-CM | POA: Diagnosis not present

## 2018-10-02 DIAGNOSIS — C8590 Non-Hodgkin lymphoma, unspecified, unspecified site: Secondary | ICD-10-CM | POA: Diagnosis not present

## 2018-10-02 DIAGNOSIS — D631 Anemia in chronic kidney disease: Secondary | ICD-10-CM | POA: Diagnosis not present

## 2018-10-02 DIAGNOSIS — E785 Hyperlipidemia, unspecified: Secondary | ICD-10-CM | POA: Diagnosis not present

## 2018-10-02 DIAGNOSIS — N2581 Secondary hyperparathyroidism of renal origin: Secondary | ICD-10-CM | POA: Diagnosis not present

## 2018-10-02 DIAGNOSIS — N183 Chronic kidney disease, stage 3 (moderate): Secondary | ICD-10-CM | POA: Diagnosis not present

## 2018-10-07 DIAGNOSIS — K449 Diaphragmatic hernia without obstruction or gangrene: Secondary | ICD-10-CM | POA: Diagnosis not present

## 2018-10-07 DIAGNOSIS — K4090 Unilateral inguinal hernia, without obstruction or gangrene, not specified as recurrent: Secondary | ICD-10-CM | POA: Diagnosis not present

## 2018-10-07 DIAGNOSIS — Z7682 Awaiting organ transplant status: Secondary | ICD-10-CM | POA: Diagnosis not present

## 2018-10-07 DIAGNOSIS — R591 Generalized enlarged lymph nodes: Secondary | ICD-10-CM | POA: Diagnosis not present

## 2018-10-07 DIAGNOSIS — R161 Splenomegaly, not elsewhere classified: Secondary | ICD-10-CM | POA: Diagnosis not present

## 2018-10-07 DIAGNOSIS — D539 Nutritional anemia, unspecified: Secondary | ICD-10-CM | POA: Diagnosis not present

## 2018-10-07 DIAGNOSIS — C8318 Mantle cell lymphoma, lymph nodes of multiple sites: Secondary | ICD-10-CM | POA: Diagnosis not present

## 2018-10-07 DIAGNOSIS — I2510 Atherosclerotic heart disease of native coronary artery without angina pectoris: Secondary | ICD-10-CM | POA: Diagnosis not present

## 2018-10-07 DIAGNOSIS — C8310 Mantle cell lymphoma, unspecified site: Secondary | ICD-10-CM | POA: Diagnosis not present

## 2018-10-07 DIAGNOSIS — Z1159 Encounter for screening for other viral diseases: Secondary | ICD-10-CM | POA: Diagnosis not present

## 2018-10-07 DIAGNOSIS — J438 Other emphysema: Secondary | ICD-10-CM | POA: Diagnosis not present

## 2018-10-15 DIAGNOSIS — C8310 Mantle cell lymphoma, unspecified site: Secondary | ICD-10-CM | POA: Diagnosis not present

## 2018-10-15 DIAGNOSIS — C8318 Mantle cell lymphoma, lymph nodes of multiple sites: Secondary | ICD-10-CM | POA: Diagnosis not present

## 2018-10-15 DIAGNOSIS — Z79899 Other long term (current) drug therapy: Secondary | ICD-10-CM | POA: Diagnosis not present

## 2018-10-21 DIAGNOSIS — I358 Other nonrheumatic aortic valve disorders: Secondary | ICD-10-CM | POA: Diagnosis not present

## 2018-10-21 DIAGNOSIS — C8310 Mantle cell lymphoma, unspecified site: Secondary | ICD-10-CM | POA: Diagnosis not present

## 2018-10-22 DIAGNOSIS — C8310 Mantle cell lymphoma, unspecified site: Secondary | ICD-10-CM | POA: Diagnosis not present

## 2018-10-22 DIAGNOSIS — Z5111 Encounter for antineoplastic chemotherapy: Secondary | ICD-10-CM | POA: Diagnosis not present

## 2018-11-12 DIAGNOSIS — L719 Rosacea, unspecified: Secondary | ICD-10-CM | POA: Diagnosis not present

## 2018-11-12 DIAGNOSIS — Z79899 Other long term (current) drug therapy: Secondary | ICD-10-CM | POA: Diagnosis not present

## 2018-11-12 DIAGNOSIS — Z87891 Personal history of nicotine dependence: Secondary | ICD-10-CM | POA: Diagnosis not present

## 2018-11-12 DIAGNOSIS — C8318 Mantle cell lymphoma, lymph nodes of multiple sites: Secondary | ICD-10-CM | POA: Diagnosis not present

## 2018-12-02 DIAGNOSIS — C8319 Mantle cell lymphoma, extranodal and solid organ sites: Secondary | ICD-10-CM | POA: Diagnosis not present

## 2018-12-02 DIAGNOSIS — C8318 Mantle cell lymphoma, lymph nodes of multiple sites: Secondary | ICD-10-CM | POA: Diagnosis not present

## 2018-12-04 DIAGNOSIS — R69 Illness, unspecified: Secondary | ICD-10-CM | POA: Diagnosis not present

## 2018-12-10 DIAGNOSIS — Z01812 Encounter for preprocedural laboratory examination: Secondary | ICD-10-CM | POA: Diagnosis not present

## 2018-12-10 DIAGNOSIS — C8318 Mantle cell lymphoma, lymph nodes of multiple sites: Secondary | ICD-10-CM | POA: Diagnosis not present

## 2018-12-10 DIAGNOSIS — Z7682 Awaiting organ transplant status: Secondary | ICD-10-CM | POA: Diagnosis not present

## 2018-12-10 DIAGNOSIS — Z20828 Contact with and (suspected) exposure to other viral communicable diseases: Secondary | ICD-10-CM | POA: Diagnosis not present

## 2018-12-15 DIAGNOSIS — C8310 Mantle cell lymphoma, unspecified site: Secondary | ICD-10-CM | POA: Diagnosis not present

## 2018-12-17 DIAGNOSIS — Z79899 Other long term (current) drug therapy: Secondary | ICD-10-CM | POA: Diagnosis not present

## 2018-12-17 DIAGNOSIS — Z7682 Awaiting organ transplant status: Secondary | ICD-10-CM | POA: Diagnosis not present

## 2018-12-17 DIAGNOSIS — D8989 Other specified disorders involving the immune mechanism, not elsewhere classified: Secondary | ICD-10-CM | POA: Diagnosis not present

## 2018-12-17 DIAGNOSIS — Z01810 Encounter for preprocedural cardiovascular examination: Secondary | ICD-10-CM | POA: Diagnosis not present

## 2018-12-17 DIAGNOSIS — Z720 Tobacco use: Secondary | ICD-10-CM | POA: Diagnosis not present

## 2018-12-17 DIAGNOSIS — Z5181 Encounter for therapeutic drug level monitoring: Secondary | ICD-10-CM | POA: Diagnosis not present

## 2018-12-17 DIAGNOSIS — C8318 Mantle cell lymphoma, lymph nodes of multiple sites: Secondary | ICD-10-CM | POA: Diagnosis not present

## 2018-12-17 DIAGNOSIS — J438 Other emphysema: Secondary | ICD-10-CM | POA: Diagnosis not present

## 2018-12-17 DIAGNOSIS — Z0389 Encounter for observation for other suspected diseases and conditions ruled out: Secondary | ICD-10-CM | POA: Diagnosis not present

## 2018-12-17 DIAGNOSIS — R002 Palpitations: Secondary | ICD-10-CM | POA: Diagnosis not present

## 2018-12-17 DIAGNOSIS — R9431 Abnormal electrocardiogram [ECG] [EKG]: Secondary | ICD-10-CM | POA: Diagnosis not present

## 2018-12-18 ENCOUNTER — Other Ambulatory Visit: Payer: Self-pay | Admitting: Family Medicine

## 2018-12-18 DIAGNOSIS — E782 Mixed hyperlipidemia: Secondary | ICD-10-CM

## 2018-12-24 DIAGNOSIS — C8318 Mantle cell lymphoma, lymph nodes of multiple sites: Secondary | ICD-10-CM | POA: Diagnosis not present

## 2018-12-24 DIAGNOSIS — C8310 Mantle cell lymphoma, unspecified site: Secondary | ICD-10-CM | POA: Diagnosis not present

## 2018-12-29 DIAGNOSIS — Z9484 Stem cells transplant status: Secondary | ICD-10-CM | POA: Insufficient documentation

## 2018-12-31 DIAGNOSIS — N183 Chronic kidney disease, stage 3 (moderate): Secondary | ICD-10-CM | POA: Diagnosis not present

## 2018-12-31 DIAGNOSIS — K6289 Other specified diseases of anus and rectum: Secondary | ICD-10-CM | POA: Diagnosis not present

## 2018-12-31 DIAGNOSIS — Z1211 Encounter for screening for malignant neoplasm of colon: Secondary | ICD-10-CM | POA: Diagnosis not present

## 2018-12-31 DIAGNOSIS — C8318 Mantle cell lymphoma, lymph nodes of multiple sites: Secondary | ICD-10-CM | POA: Diagnosis not present

## 2018-12-31 DIAGNOSIS — K219 Gastro-esophageal reflux disease without esophagitis: Secondary | ICD-10-CM | POA: Diagnosis not present

## 2018-12-31 DIAGNOSIS — K5730 Diverticulosis of large intestine without perforation or abscess without bleeding: Secondary | ICD-10-CM | POA: Diagnosis not present

## 2018-12-31 DIAGNOSIS — Z79899 Other long term (current) drug therapy: Secondary | ICD-10-CM | POA: Diagnosis not present

## 2018-12-31 DIAGNOSIS — Z87891 Personal history of nicotine dependence: Secondary | ICD-10-CM | POA: Diagnosis not present

## 2019-01-03 DIAGNOSIS — C8318 Mantle cell lymphoma, lymph nodes of multiple sites: Secondary | ICD-10-CM | POA: Diagnosis not present

## 2019-01-03 DIAGNOSIS — Z52091 Other blood donor, stem cells: Secondary | ICD-10-CM | POA: Diagnosis not present

## 2019-01-04 DIAGNOSIS — C8318 Mantle cell lymphoma, lymph nodes of multiple sites: Secondary | ICD-10-CM | POA: Diagnosis not present

## 2019-01-04 DIAGNOSIS — Z79899 Other long term (current) drug therapy: Secondary | ICD-10-CM | POA: Diagnosis not present

## 2019-01-05 DIAGNOSIS — C8318 Mantle cell lymphoma, lymph nodes of multiple sites: Secondary | ICD-10-CM | POA: Diagnosis not present

## 2019-01-05 DIAGNOSIS — Z52091 Other blood donor, stem cells: Secondary | ICD-10-CM | POA: Diagnosis not present

## 2019-01-06 DIAGNOSIS — C8318 Mantle cell lymphoma, lymph nodes of multiple sites: Secondary | ICD-10-CM | POA: Diagnosis not present

## 2019-01-06 DIAGNOSIS — Z7682 Awaiting organ transplant status: Secondary | ICD-10-CM | POA: Diagnosis not present

## 2019-01-06 DIAGNOSIS — Z52011 Autologous donor, stem cells: Secondary | ICD-10-CM | POA: Diagnosis not present

## 2019-01-06 DIAGNOSIS — Z452 Encounter for adjustment and management of vascular access device: Secondary | ICD-10-CM | POA: Diagnosis not present

## 2019-01-07 DIAGNOSIS — Z52011 Autologous donor, stem cells: Secondary | ICD-10-CM | POA: Diagnosis not present

## 2019-01-07 DIAGNOSIS — Z79899 Other long term (current) drug therapy: Secondary | ICD-10-CM | POA: Diagnosis not present

## 2019-01-07 DIAGNOSIS — C8318 Mantle cell lymphoma, lymph nodes of multiple sites: Secondary | ICD-10-CM | POA: Diagnosis not present

## 2019-01-10 ENCOUNTER — Other Ambulatory Visit: Payer: Self-pay | Admitting: Family Medicine

## 2019-01-10 DIAGNOSIS — E782 Mixed hyperlipidemia: Secondary | ICD-10-CM

## 2019-01-14 DIAGNOSIS — C8318 Mantle cell lymphoma, lymph nodes of multiple sites: Secondary | ICD-10-CM | POA: Diagnosis not present

## 2019-01-14 DIAGNOSIS — C8310 Mantle cell lymphoma, unspecified site: Secondary | ICD-10-CM | POA: Diagnosis not present

## 2019-01-14 DIAGNOSIS — Z01812 Encounter for preprocedural laboratory examination: Secondary | ICD-10-CM | POA: Diagnosis not present

## 2019-01-14 DIAGNOSIS — Z20828 Contact with and (suspected) exposure to other viral communicable diseases: Secondary | ICD-10-CM | POA: Diagnosis not present

## 2019-01-14 DIAGNOSIS — Z7682 Awaiting organ transplant status: Secondary | ICD-10-CM | POA: Diagnosis not present

## 2019-01-16 DIAGNOSIS — X58XXXA Exposure to other specified factors, initial encounter: Secondary | ICD-10-CM | POA: Diagnosis not present

## 2019-01-16 DIAGNOSIS — E7800 Pure hypercholesterolemia, unspecified: Secondary | ICD-10-CM | POA: Diagnosis not present

## 2019-01-16 DIAGNOSIS — T451X5A Adverse effect of antineoplastic and immunosuppressive drugs, initial encounter: Secondary | ICD-10-CM | POA: Diagnosis not present

## 2019-01-16 DIAGNOSIS — Z9221 Personal history of antineoplastic chemotherapy: Secondary | ICD-10-CM | POA: Diagnosis not present

## 2019-01-16 DIAGNOSIS — C8310 Mantle cell lymphoma, unspecified site: Secondary | ICD-10-CM | POA: Diagnosis not present

## 2019-01-16 DIAGNOSIS — Z79899 Other long term (current) drug therapy: Secondary | ICD-10-CM | POA: Diagnosis not present

## 2019-01-16 DIAGNOSIS — N189 Chronic kidney disease, unspecified: Secondary | ICD-10-CM | POA: Diagnosis not present

## 2019-01-16 DIAGNOSIS — D61810 Antineoplastic chemotherapy induced pancytopenia: Secondary | ICD-10-CM | POA: Diagnosis not present

## 2019-01-16 DIAGNOSIS — D6959 Other secondary thrombocytopenia: Secondary | ICD-10-CM | POA: Diagnosis not present

## 2019-01-17 DIAGNOSIS — Z79899 Other long term (current) drug therapy: Secondary | ICD-10-CM | POA: Diagnosis not present

## 2019-01-17 DIAGNOSIS — Z9484 Stem cells transplant status: Secondary | ICD-10-CM | POA: Diagnosis not present

## 2019-01-17 DIAGNOSIS — T451X5A Adverse effect of antineoplastic and immunosuppressive drugs, initial encounter: Secondary | ICD-10-CM | POA: Diagnosis not present

## 2019-01-17 DIAGNOSIS — D61810 Antineoplastic chemotherapy induced pancytopenia: Secondary | ICD-10-CM | POA: Diagnosis not present

## 2019-01-17 DIAGNOSIS — C8310 Mantle cell lymphoma, unspecified site: Secondary | ICD-10-CM | POA: Diagnosis not present

## 2019-01-17 DIAGNOSIS — E7800 Pure hypercholesterolemia, unspecified: Secondary | ICD-10-CM | POA: Diagnosis not present

## 2019-01-18 DIAGNOSIS — C8318 Mantle cell lymphoma, lymph nodes of multiple sites: Secondary | ICD-10-CM | POA: Diagnosis not present

## 2019-01-19 DIAGNOSIS — Z9484 Stem cells transplant status: Secondary | ICD-10-CM | POA: Diagnosis not present

## 2019-01-19 DIAGNOSIS — D61810 Antineoplastic chemotherapy induced pancytopenia: Secondary | ICD-10-CM | POA: Diagnosis not present

## 2019-01-19 DIAGNOSIS — T451X5A Adverse effect of antineoplastic and immunosuppressive drugs, initial encounter: Secondary | ICD-10-CM | POA: Diagnosis not present

## 2019-01-19 DIAGNOSIS — E7800 Pure hypercholesterolemia, unspecified: Secondary | ICD-10-CM | POA: Diagnosis not present

## 2019-01-19 DIAGNOSIS — Z79899 Other long term (current) drug therapy: Secondary | ICD-10-CM | POA: Diagnosis not present

## 2019-01-19 DIAGNOSIS — C8310 Mantle cell lymphoma, unspecified site: Secondary | ICD-10-CM | POA: Diagnosis not present

## 2019-01-20 DIAGNOSIS — Z79899 Other long term (current) drug therapy: Secondary | ICD-10-CM | POA: Diagnosis not present

## 2019-01-20 DIAGNOSIS — C8310 Mantle cell lymphoma, unspecified site: Secondary | ICD-10-CM | POA: Diagnosis not present

## 2019-01-20 DIAGNOSIS — Z9484 Stem cells transplant status: Secondary | ICD-10-CM | POA: Diagnosis not present

## 2019-01-21 DIAGNOSIS — Z9221 Personal history of antineoplastic chemotherapy: Secondary | ICD-10-CM | POA: Diagnosis not present

## 2019-01-21 DIAGNOSIS — X58XXXA Exposure to other specified factors, initial encounter: Secondary | ICD-10-CM | POA: Diagnosis not present

## 2019-01-21 DIAGNOSIS — D6959 Other secondary thrombocytopenia: Secondary | ICD-10-CM | POA: Diagnosis not present

## 2019-01-21 DIAGNOSIS — T451X5A Adverse effect of antineoplastic and immunosuppressive drugs, initial encounter: Secondary | ICD-10-CM | POA: Diagnosis not present

## 2019-01-21 DIAGNOSIS — C8310 Mantle cell lymphoma, unspecified site: Secondary | ICD-10-CM | POA: Diagnosis not present

## 2019-01-21 DIAGNOSIS — Z79899 Other long term (current) drug therapy: Secondary | ICD-10-CM | POA: Diagnosis not present

## 2019-01-21 DIAGNOSIS — D61810 Antineoplastic chemotherapy induced pancytopenia: Secondary | ICD-10-CM | POA: Diagnosis not present

## 2019-01-21 DIAGNOSIS — E7800 Pure hypercholesterolemia, unspecified: Secondary | ICD-10-CM | POA: Diagnosis not present

## 2019-01-22 DIAGNOSIS — C8310 Mantle cell lymphoma, unspecified site: Secondary | ICD-10-CM | POA: Diagnosis not present

## 2019-01-22 DIAGNOSIS — Z79899 Other long term (current) drug therapy: Secondary | ICD-10-CM | POA: Diagnosis not present

## 2019-01-22 DIAGNOSIS — D61811 Other drug-induced pancytopenia: Secondary | ICD-10-CM | POA: Diagnosis not present

## 2019-01-22 DIAGNOSIS — E7800 Pure hypercholesterolemia, unspecified: Secondary | ICD-10-CM | POA: Diagnosis not present

## 2019-01-22 DIAGNOSIS — T451X5A Adverse effect of antineoplastic and immunosuppressive drugs, initial encounter: Secondary | ICD-10-CM | POA: Diagnosis not present

## 2019-01-22 DIAGNOSIS — Z9484 Stem cells transplant status: Secondary | ICD-10-CM | POA: Diagnosis not present

## 2019-01-23 DIAGNOSIS — Z9484 Stem cells transplant status: Secondary | ICD-10-CM | POA: Diagnosis not present

## 2019-01-23 DIAGNOSIS — C8310 Mantle cell lymphoma, unspecified site: Secondary | ICD-10-CM | POA: Diagnosis not present

## 2019-01-23 DIAGNOSIS — D61818 Other pancytopenia: Secondary | ICD-10-CM | POA: Diagnosis not present

## 2019-01-23 DIAGNOSIS — T451X5A Adverse effect of antineoplastic and immunosuppressive drugs, initial encounter: Secondary | ICD-10-CM | POA: Diagnosis not present

## 2019-01-23 DIAGNOSIS — E7800 Pure hypercholesterolemia, unspecified: Secondary | ICD-10-CM | POA: Diagnosis not present

## 2019-01-23 DIAGNOSIS — R072 Precordial pain: Secondary | ICD-10-CM | POA: Diagnosis not present

## 2019-01-23 DIAGNOSIS — Z79899 Other long term (current) drug therapy: Secondary | ICD-10-CM | POA: Diagnosis not present

## 2019-01-24 DIAGNOSIS — D61818 Other pancytopenia: Secondary | ICD-10-CM | POA: Diagnosis not present

## 2019-01-24 DIAGNOSIS — Z9484 Stem cells transplant status: Secondary | ICD-10-CM | POA: Diagnosis not present

## 2019-01-24 DIAGNOSIS — T451X5A Adverse effect of antineoplastic and immunosuppressive drugs, initial encounter: Secondary | ICD-10-CM | POA: Diagnosis not present

## 2019-01-24 DIAGNOSIS — E7800 Pure hypercholesterolemia, unspecified: Secondary | ICD-10-CM | POA: Diagnosis not present

## 2019-01-24 DIAGNOSIS — C8310 Mantle cell lymphoma, unspecified site: Secondary | ICD-10-CM | POA: Diagnosis not present

## 2019-01-24 DIAGNOSIS — Z79899 Other long term (current) drug therapy: Secondary | ICD-10-CM | POA: Diagnosis not present

## 2019-01-25 DIAGNOSIS — T451X5A Adverse effect of antineoplastic and immunosuppressive drugs, initial encounter: Secondary | ICD-10-CM | POA: Diagnosis not present

## 2019-01-25 DIAGNOSIS — C8310 Mantle cell lymphoma, unspecified site: Secondary | ICD-10-CM | POA: Diagnosis not present

## 2019-01-25 DIAGNOSIS — D61810 Antineoplastic chemotherapy induced pancytopenia: Secondary | ICD-10-CM | POA: Diagnosis not present

## 2019-01-25 DIAGNOSIS — Z9484 Stem cells transplant status: Secondary | ICD-10-CM | POA: Diagnosis not present

## 2019-01-25 DIAGNOSIS — E7800 Pure hypercholesterolemia, unspecified: Secondary | ICD-10-CM | POA: Diagnosis not present

## 2019-01-25 DIAGNOSIS — R110 Nausea: Secondary | ICD-10-CM | POA: Diagnosis not present

## 2019-01-26 DIAGNOSIS — C8310 Mantle cell lymphoma, unspecified site: Secondary | ICD-10-CM | POA: Diagnosis not present

## 2019-01-27 DIAGNOSIS — R197 Diarrhea, unspecified: Secondary | ICD-10-CM | POA: Diagnosis not present

## 2019-01-27 DIAGNOSIS — C8310 Mantle cell lymphoma, unspecified site: Secondary | ICD-10-CM | POA: Diagnosis not present

## 2019-01-27 DIAGNOSIS — C8318 Mantle cell lymphoma, lymph nodes of multiple sites: Secondary | ICD-10-CM | POA: Diagnosis not present

## 2019-01-28 DIAGNOSIS — N183 Chronic kidney disease, stage 3 unspecified: Secondary | ICD-10-CM | POA: Diagnosis not present

## 2019-01-28 DIAGNOSIS — E7800 Pure hypercholesterolemia, unspecified: Secondary | ICD-10-CM | POA: Diagnosis not present

## 2019-01-28 DIAGNOSIS — R197 Diarrhea, unspecified: Secondary | ICD-10-CM | POA: Diagnosis not present

## 2019-01-28 DIAGNOSIS — R5081 Fever presenting with conditions classified elsewhere: Secondary | ICD-10-CM | POA: Diagnosis not present

## 2019-01-28 DIAGNOSIS — D61810 Antineoplastic chemotherapy induced pancytopenia: Secondary | ICD-10-CM | POA: Diagnosis not present

## 2019-01-28 DIAGNOSIS — R633 Feeding difficulties: Secondary | ICD-10-CM | POA: Diagnosis not present

## 2019-01-28 DIAGNOSIS — Z9481 Bone marrow transplant status: Secondary | ICD-10-CM | POA: Diagnosis not present

## 2019-01-28 DIAGNOSIS — C8310 Mantle cell lymphoma, unspecified site: Secondary | ICD-10-CM | POA: Diagnosis not present

## 2019-01-28 DIAGNOSIS — R5381 Other malaise: Secondary | ICD-10-CM | POA: Diagnosis not present

## 2019-01-28 DIAGNOSIS — E876 Hypokalemia: Secondary | ICD-10-CM | POA: Diagnosis not present

## 2019-01-28 DIAGNOSIS — Z9484 Stem cells transplant status: Secondary | ICD-10-CM | POA: Diagnosis not present

## 2019-01-28 DIAGNOSIS — D709 Neutropenia, unspecified: Secondary | ICD-10-CM | POA: Diagnosis not present

## 2019-01-28 DIAGNOSIS — K529 Noninfective gastroenteritis and colitis, unspecified: Secondary | ICD-10-CM | POA: Diagnosis not present

## 2019-01-29 DIAGNOSIS — E7800 Pure hypercholesterolemia, unspecified: Secondary | ICD-10-CM | POA: Diagnosis not present

## 2019-01-29 DIAGNOSIS — R197 Diarrhea, unspecified: Secondary | ICD-10-CM | POA: Diagnosis not present

## 2019-01-29 DIAGNOSIS — D61810 Antineoplastic chemotherapy induced pancytopenia: Secondary | ICD-10-CM | POA: Diagnosis not present

## 2019-01-29 DIAGNOSIS — Z9484 Stem cells transplant status: Secondary | ICD-10-CM | POA: Diagnosis not present

## 2019-01-29 DIAGNOSIS — C8310 Mantle cell lymphoma, unspecified site: Secondary | ICD-10-CM | POA: Diagnosis not present

## 2019-01-29 DIAGNOSIS — R5081 Fever presenting with conditions classified elsewhere: Secondary | ICD-10-CM | POA: Diagnosis not present

## 2019-01-29 DIAGNOSIS — D709 Neutropenia, unspecified: Secondary | ICD-10-CM | POA: Diagnosis not present

## 2019-01-30 DIAGNOSIS — D709 Neutropenia, unspecified: Secondary | ICD-10-CM | POA: Diagnosis not present

## 2019-01-30 DIAGNOSIS — C8310 Mantle cell lymphoma, unspecified site: Secondary | ICD-10-CM | POA: Diagnosis not present

## 2019-01-30 DIAGNOSIS — E7800 Pure hypercholesterolemia, unspecified: Secondary | ICD-10-CM | POA: Diagnosis not present

## 2019-01-30 DIAGNOSIS — R197 Diarrhea, unspecified: Secondary | ICD-10-CM | POA: Diagnosis not present

## 2019-01-30 DIAGNOSIS — D61810 Antineoplastic chemotherapy induced pancytopenia: Secondary | ICD-10-CM | POA: Diagnosis not present

## 2019-01-30 DIAGNOSIS — R5081 Fever presenting with conditions classified elsewhere: Secondary | ICD-10-CM | POA: Diagnosis not present

## 2019-01-30 DIAGNOSIS — Z9484 Stem cells transplant status: Secondary | ICD-10-CM | POA: Diagnosis not present

## 2019-01-31 DIAGNOSIS — D61810 Antineoplastic chemotherapy induced pancytopenia: Secondary | ICD-10-CM | POA: Diagnosis not present

## 2019-01-31 DIAGNOSIS — R5081 Fever presenting with conditions classified elsewhere: Secondary | ICD-10-CM | POA: Diagnosis not present

## 2019-01-31 DIAGNOSIS — N183 Chronic kidney disease, stage 3 unspecified: Secondary | ICD-10-CM | POA: Diagnosis not present

## 2019-01-31 DIAGNOSIS — C8310 Mantle cell lymphoma, unspecified site: Secondary | ICD-10-CM | POA: Diagnosis not present

## 2019-01-31 DIAGNOSIS — D709 Neutropenia, unspecified: Secondary | ICD-10-CM | POA: Diagnosis not present

## 2019-02-01 DIAGNOSIS — C8310 Mantle cell lymphoma, unspecified site: Secondary | ICD-10-CM | POA: Diagnosis not present

## 2019-02-01 DIAGNOSIS — D709 Neutropenia, unspecified: Secondary | ICD-10-CM | POA: Diagnosis not present

## 2019-02-01 DIAGNOSIS — R5081 Fever presenting with conditions classified elsewhere: Secondary | ICD-10-CM | POA: Diagnosis not present

## 2019-02-01 DIAGNOSIS — D61810 Antineoplastic chemotherapy induced pancytopenia: Secondary | ICD-10-CM | POA: Diagnosis not present

## 2019-02-02 DIAGNOSIS — Z9481 Bone marrow transplant status: Secondary | ICD-10-CM | POA: Diagnosis not present

## 2019-02-02 DIAGNOSIS — C8310 Mantle cell lymphoma, unspecified site: Secondary | ICD-10-CM | POA: Diagnosis not present

## 2019-02-03 DIAGNOSIS — D61810 Antineoplastic chemotherapy induced pancytopenia: Secondary | ICD-10-CM | POA: Diagnosis not present

## 2019-02-03 DIAGNOSIS — C8310 Mantle cell lymphoma, unspecified site: Secondary | ICD-10-CM | POA: Diagnosis not present

## 2019-02-03 DIAGNOSIS — T451X5A Adverse effect of antineoplastic and immunosuppressive drugs, initial encounter: Secondary | ICD-10-CM | POA: Diagnosis not present

## 2019-02-03 DIAGNOSIS — Z9484 Stem cells transplant status: Secondary | ICD-10-CM | POA: Diagnosis not present

## 2019-02-03 DIAGNOSIS — E876 Hypokalemia: Secondary | ICD-10-CM | POA: Diagnosis not present

## 2019-02-03 DIAGNOSIS — Z79899 Other long term (current) drug therapy: Secondary | ICD-10-CM | POA: Diagnosis not present

## 2019-02-03 DIAGNOSIS — E7800 Pure hypercholesterolemia, unspecified: Secondary | ICD-10-CM | POA: Diagnosis not present

## 2019-02-04 DIAGNOSIS — Z452 Encounter for adjustment and management of vascular access device: Secondary | ICD-10-CM | POA: Diagnosis not present

## 2019-02-04 DIAGNOSIS — Z52011 Autologous donor, stem cells: Secondary | ICD-10-CM | POA: Diagnosis not present

## 2019-02-04 DIAGNOSIS — C8318 Mantle cell lymphoma, lymph nodes of multiple sites: Secondary | ICD-10-CM | POA: Diagnosis not present

## 2019-02-04 DIAGNOSIS — C8310 Mantle cell lymphoma, unspecified site: Secondary | ICD-10-CM | POA: Diagnosis not present

## 2019-02-11 DIAGNOSIS — Z9484 Stem cells transplant status: Secondary | ICD-10-CM | POA: Diagnosis not present

## 2019-02-11 DIAGNOSIS — C8310 Mantle cell lymphoma, unspecified site: Secondary | ICD-10-CM | POA: Diagnosis not present

## 2019-02-18 DIAGNOSIS — Z79899 Other long term (current) drug therapy: Secondary | ICD-10-CM | POA: Diagnosis not present

## 2019-02-18 DIAGNOSIS — C8310 Mantle cell lymphoma, unspecified site: Secondary | ICD-10-CM | POA: Diagnosis not present

## 2019-02-18 DIAGNOSIS — C8318 Mantle cell lymphoma, lymph nodes of multiple sites: Secondary | ICD-10-CM | POA: Diagnosis not present

## 2019-02-18 DIAGNOSIS — Z9484 Stem cells transplant status: Secondary | ICD-10-CM | POA: Diagnosis not present

## 2019-03-25 DIAGNOSIS — Z792 Long term (current) use of antibiotics: Secondary | ICD-10-CM | POA: Diagnosis not present

## 2019-03-25 DIAGNOSIS — C8310 Mantle cell lymphoma, unspecified site: Secondary | ICD-10-CM | POA: Diagnosis not present

## 2019-03-25 DIAGNOSIS — Z9484 Stem cells transplant status: Secondary | ICD-10-CM | POA: Diagnosis not present

## 2019-03-25 DIAGNOSIS — C8318 Mantle cell lymphoma, lymph nodes of multiple sites: Secondary | ICD-10-CM | POA: Diagnosis not present

## 2019-03-25 DIAGNOSIS — B009 Herpesviral infection, unspecified: Secondary | ICD-10-CM | POA: Diagnosis not present

## 2019-03-25 DIAGNOSIS — Z79899 Other long term (current) drug therapy: Secondary | ICD-10-CM | POA: Diagnosis not present

## 2019-04-07 DIAGNOSIS — L718 Other rosacea: Secondary | ICD-10-CM | POA: Diagnosis not present

## 2019-04-29 DIAGNOSIS — R590 Localized enlarged lymph nodes: Secondary | ICD-10-CM | POA: Diagnosis not present

## 2019-04-29 DIAGNOSIS — Z792 Long term (current) use of antibiotics: Secondary | ICD-10-CM | POA: Diagnosis not present

## 2019-04-29 DIAGNOSIS — R21 Rash and other nonspecific skin eruption: Secondary | ICD-10-CM | POA: Diagnosis not present

## 2019-04-29 DIAGNOSIS — R5383 Other fatigue: Secondary | ICD-10-CM | POA: Diagnosis not present

## 2019-04-29 DIAGNOSIS — Z79899 Other long term (current) drug therapy: Secondary | ICD-10-CM | POA: Diagnosis not present

## 2019-04-29 DIAGNOSIS — H6123 Impacted cerumen, bilateral: Secondary | ICD-10-CM | POA: Diagnosis not present

## 2019-04-29 DIAGNOSIS — Z9484 Stem cells transplant status: Secondary | ICD-10-CM | POA: Diagnosis not present

## 2019-04-29 DIAGNOSIS — C8318 Mantle cell lymphoma, lymph nodes of multiple sites: Secondary | ICD-10-CM | POA: Diagnosis not present

## 2019-04-29 DIAGNOSIS — D649 Anemia, unspecified: Secondary | ICD-10-CM | POA: Diagnosis not present

## 2019-04-29 DIAGNOSIS — C8310 Mantle cell lymphoma, unspecified site: Secondary | ICD-10-CM | POA: Diagnosis not present

## 2019-04-30 DIAGNOSIS — H6123 Impacted cerumen, bilateral: Secondary | ICD-10-CM | POA: Diagnosis not present

## 2019-04-30 DIAGNOSIS — Z87891 Personal history of nicotine dependence: Secondary | ICD-10-CM | POA: Diagnosis not present

## 2019-04-30 DIAGNOSIS — H938X1 Other specified disorders of right ear: Secondary | ICD-10-CM | POA: Diagnosis not present

## 2019-04-30 DIAGNOSIS — C8310 Mantle cell lymphoma, unspecified site: Secondary | ICD-10-CM | POA: Diagnosis not present

## 2019-04-30 DIAGNOSIS — L299 Pruritus, unspecified: Secondary | ICD-10-CM | POA: Diagnosis not present

## 2019-04-30 DIAGNOSIS — H900 Conductive hearing loss, bilateral: Secondary | ICD-10-CM | POA: Diagnosis not present

## 2019-05-01 DIAGNOSIS — Z5111 Encounter for antineoplastic chemotherapy: Secondary | ICD-10-CM | POA: Diagnosis not present

## 2019-05-01 DIAGNOSIS — C8318 Mantle cell lymphoma, lymph nodes of multiple sites: Secondary | ICD-10-CM | POA: Diagnosis not present

## 2019-05-02 DIAGNOSIS — C8590 Non-Hodgkin lymphoma, unspecified, unspecified site: Secondary | ICD-10-CM | POA: Diagnosis not present

## 2019-05-02 DIAGNOSIS — Z7982 Long term (current) use of aspirin: Secondary | ICD-10-CM | POA: Diagnosis not present

## 2019-05-02 DIAGNOSIS — E785 Hyperlipidemia, unspecified: Secondary | ICD-10-CM | POA: Diagnosis not present

## 2019-05-02 DIAGNOSIS — D649 Anemia, unspecified: Secondary | ICD-10-CM | POA: Diagnosis not present

## 2019-05-02 DIAGNOSIS — B029 Zoster without complications: Secondary | ICD-10-CM | POA: Diagnosis not present

## 2019-05-02 DIAGNOSIS — R69 Illness, unspecified: Secondary | ICD-10-CM | POA: Diagnosis not present

## 2019-05-02 DIAGNOSIS — K219 Gastro-esophageal reflux disease without esophagitis: Secondary | ICD-10-CM | POA: Diagnosis not present

## 2019-05-02 DIAGNOSIS — Z809 Family history of malignant neoplasm, unspecified: Secondary | ICD-10-CM | POA: Diagnosis not present

## 2019-05-05 DIAGNOSIS — L718 Other rosacea: Secondary | ICD-10-CM | POA: Diagnosis not present

## 2019-06-02 DIAGNOSIS — L718 Other rosacea: Secondary | ICD-10-CM | POA: Diagnosis not present

## 2019-06-26 ENCOUNTER — Telehealth (INDEPENDENT_AMBULATORY_CARE_PROVIDER_SITE_OTHER): Payer: Medicare HMO | Admitting: Family Medicine

## 2019-06-26 ENCOUNTER — Encounter: Payer: Self-pay | Admitting: Family Medicine

## 2019-06-26 DIAGNOSIS — N1831 Chronic kidney disease, stage 3a: Secondary | ICD-10-CM | POA: Diagnosis not present

## 2019-06-26 DIAGNOSIS — Z79899 Other long term (current) drug therapy: Secondary | ICD-10-CM | POA: Diagnosis not present

## 2019-06-26 DIAGNOSIS — E782 Mixed hyperlipidemia: Secondary | ICD-10-CM | POA: Diagnosis not present

## 2019-06-26 DIAGNOSIS — C8310 Mantle cell lymphoma, unspecified site: Secondary | ICD-10-CM | POA: Diagnosis not present

## 2019-06-26 DIAGNOSIS — C8318 Mantle cell lymphoma, lymph nodes of multiple sites: Secondary | ICD-10-CM | POA: Diagnosis not present

## 2019-06-26 DIAGNOSIS — Z5111 Encounter for antineoplastic chemotherapy: Secondary | ICD-10-CM | POA: Diagnosis not present

## 2019-06-26 MED ORDER — SIMVASTATIN 40 MG PO TABS: 40.0000 mg | ORAL_TABLET | Freq: Every day | ORAL | 3 refills | Status: DC

## 2019-06-26 NOTE — Progress Notes (Signed)
Virtual Visit via Mychart video Note  I connected with Shawn Cline on 06/26/19 at 1315 by video and verified that I am speaking with the correct person using two identifiers. Shawn Cline is currently located at home and Wife are currently with her during visit. The provider, Fransisca Kaufmann Ladene Allocca, MD is located in their office at time of visit.  Call ended at 1329  I discussed the limitations, risks, security and privacy concerns of performing an evaluation and management service by video and the availability of in person appointments. I also discussed with the patient that there may be a patient responsible charge related to this service. The patient expressed understanding and agreed to proceed.   History and Present Illness: Hyperlipidemia Patient is coming in for recheck of his hyperlipidemia. The patient is currently taking simvastatin. They deny any issues with myalgias or history of liver damage from it. They deny any focal numbness or weakness or chest pain.   ckd 3 Patient is stable and seeing oncology and [facility] is managing along with his mantle cell lymphoma which he just underwent stem cell transplant for bone marrow replacement.   1. Mixed hyperlipidemia   2. Stage 3a chronic kidney disease   3. Mantle cell lymphoma, unspecified body region [facility])     Outpatient Encounter Medications as of 06/26/2019  Medication Sig  . acyclovir (ZOVIRAX) 400 MG tablet Take 800 mg by mouth in the morning and at bedtime.  . folic acid (FOLVITE) 1 MG tablet Take 1 mg by mouth daily.  . Melatonin 5 MG TABS Take by mouth.  . Multiple Vitamin (MULTIVITAMIN) capsule Take 1 capsule by mouth daily.  Marland Kitchen omeprazole (PRILOSEC) 20 MG capsule Take 20 mg by mouth daily.  Marland Kitchen sulfamethoxazole-trimethoprim (BACTRIM DS) 800-160 MG tablet Take 1 tablet by mouth 3 (three) times a week.  Marland Kitchen aspirin EC 81 MG tablet Take 81 mg by mouth daily.  . cholecalciferol (VITAMIN D) 1000 units tablet Take 1 tablet  (1,000 Units total) by mouth daily.  . metroNIDAZOLE (METROGEL) 1 % gel Apply topically daily.  . simvastatin (ZOCOR) 40 MG tablet Take 1 tablet (40 mg total) by mouth daily at 6 PM. (please make yearly appt)  . [DISCONTINUED] acetaminophen (TYLENOL) 500 MG tablet Take 1,000 mg by mouth at bedtime.  . [DISCONTINUED] allopurinol (ZYLOPRIM) 100 MG tablet Take 1 tablet (100 mg total) by mouth daily.  . [DISCONTINUED] HYDROcodone-acetaminophen (NORCO/VICODIN) 5-325 MG tablet Take 1 tablet by mouth every 6 (six) hours as needed for moderate pain.  . [DISCONTINUED] methocarbamol (ROBAXIN) 500 MG tablet Take 1 tablet (500 mg total) by mouth every 8 (eight) hours as needed for muscle spasms.  . [DISCONTINUED] predniSONE (DELTASONE) 20 MG tablet 2 po at same time daily for 5 days  . [DISCONTINUED] psyllium (REGULOID) 0.52 g capsule Take 4 capsules by mouth daily.  . [DISCONTINUED] simvastatin (ZOCOR) 40 MG tablet Take 1 tablet (40 mg total) by mouth daily at 6 PM. (please make yearly appt)   No facility-administered encounter medications on file as of 06/26/2019.    Review of Systems  Constitutional: Negative for chills and fever.  Respiratory: Negative for shortness of breath and wheezing.   Cardiovascular: Negative for chest pain and leg swelling.  Musculoskeletal: Negative for back pain and gait problem.  Skin: Negative for rash.  Neurological: Negative for dizziness, weakness and numbness.  All other systems reviewed and are negative.   Observations/Objective: Patient sounds comfortable and in no acute distress  Assessment and  Plan: Problem List Items Addressed This Visit      Genitourinary   CKD (chronic kidney disease) stage 3, GFR 30-59 ml/min     Other   Hyperlipidemia - Primary   Relevant Medications   simvastatin (ZOCOR) 40 MG tablet   Mantle cell lymphoma (HCC)   Relevant Medications   acyclovir (ZOVIRAX) 400 MG tablet   sulfamethoxazole-trimethoprim (BACTRIM DS) 0000000 MG  tablet   folic acid (FOLVITE) 1 MG tablet      Continue simvastatin and he will continue with oncology, we have added some medicines to his list that they have been taking after the stem cell transplant. Follow up plan: Return in about 1 year (around 06/25/2020), or if symptoms worsen or fail to improve.     I discussed the assessment and treatment plan with the patient. The patient was provided an opportunity to ask questions and all were answered. The patient agreed with the plan and demonstrated an understanding of the instructions.   The patient was advised to call back or seek an in-person evaluation if the symptoms worsen or if the condition fails to improve as anticipated.  The above assessment and management plan was discussed with the patient. The patient verbalized understanding of and has agreed to the management plan. Patient is aware to call the clinic if symptoms persist or worsen. Patient is aware when to return to the clinic for a follow-up visit. Patient educated on when it is appropriate to go to the emergency department.    I provided 14 minutes of non-face-to-face time during this encounter.    Worthy Rancher, MD

## 2019-06-29 DIAGNOSIS — R69 Illness, unspecified: Secondary | ICD-10-CM | POA: Diagnosis not present

## 2019-07-20 ENCOUNTER — Telehealth: Payer: Self-pay | Admitting: Family Medicine

## 2019-07-20 DIAGNOSIS — E782 Mixed hyperlipidemia: Secondary | ICD-10-CM

## 2019-07-20 NOTE — Telephone Encounter (Signed)
Please order lab if appropriate.

## 2019-07-20 NOTE — Telephone Encounter (Signed)
I placed an order for the cholesterol panel in the system for him.

## 2019-07-22 ENCOUNTER — Telehealth: Payer: Self-pay | Admitting: Family Medicine

## 2019-07-22 NOTE — Telephone Encounter (Signed)
I put the orders in the system, I do not know how to send him over to [facility], that is something that I guess we will have to fax over but I did put the orders in our system but I do not know how to put them in their system.  I just want a lipid panel and the order is in but we just need to get it sent to [facility]

## 2019-07-22 NOTE — Telephone Encounter (Signed)
Please advise on this.  

## 2019-07-22 NOTE — Telephone Encounter (Signed)
Left message to please call our office with a fax number for [facility].

## 2019-08-11 DIAGNOSIS — C8310 Mantle cell lymphoma, unspecified site: Secondary | ICD-10-CM | POA: Diagnosis not present

## 2019-08-11 DIAGNOSIS — Z9484 Stem cells transplant status: Secondary | ICD-10-CM | POA: Diagnosis not present

## 2019-08-21 DIAGNOSIS — Z5111 Encounter for antineoplastic chemotherapy: Secondary | ICD-10-CM | POA: Diagnosis not present

## 2019-08-21 DIAGNOSIS — Z9484 Stem cells transplant status: Secondary | ICD-10-CM | POA: Diagnosis not present

## 2019-08-21 DIAGNOSIS — C8310 Mantle cell lymphoma, unspecified site: Secondary | ICD-10-CM | POA: Diagnosis not present

## 2019-10-16 DIAGNOSIS — C8319 Mantle cell lymphoma, extranodal and solid organ sites: Secondary | ICD-10-CM | POA: Diagnosis not present

## 2019-10-16 DIAGNOSIS — Z5111 Encounter for antineoplastic chemotherapy: Secondary | ICD-10-CM | POA: Diagnosis not present

## 2019-10-16 DIAGNOSIS — Z9484 Stem cells transplant status: Secondary | ICD-10-CM | POA: Diagnosis not present

## 2019-10-21 DIAGNOSIS — D61818 Other pancytopenia: Secondary | ICD-10-CM | POA: Diagnosis not present

## 2019-10-21 DIAGNOSIS — R21 Rash and other nonspecific skin eruption: Secondary | ICD-10-CM | POA: Diagnosis not present

## 2019-10-21 DIAGNOSIS — Z23 Encounter for immunization: Secondary | ICD-10-CM | POA: Diagnosis not present

## 2019-10-21 DIAGNOSIS — Z9484 Stem cells transplant status: Secondary | ICD-10-CM | POA: Diagnosis not present

## 2019-10-21 DIAGNOSIS — C8310 Mantle cell lymphoma, unspecified site: Secondary | ICD-10-CM | POA: Diagnosis not present

## 2019-10-21 DIAGNOSIS — Z79899 Other long term (current) drug therapy: Secondary | ICD-10-CM | POA: Diagnosis not present

## 2019-10-21 DIAGNOSIS — R05 Cough: Secondary | ICD-10-CM | POA: Diagnosis not present

## 2019-10-21 DIAGNOSIS — C8318 Mantle cell lymphoma, lymph nodes of multiple sites: Secondary | ICD-10-CM | POA: Diagnosis not present

## 2019-11-18 DIAGNOSIS — Z9484 Stem cells transplant status: Secondary | ICD-10-CM | POA: Diagnosis not present

## 2019-11-18 DIAGNOSIS — Z87891 Personal history of nicotine dependence: Secondary | ICD-10-CM | POA: Diagnosis not present

## 2019-11-18 DIAGNOSIS — R0689 Other abnormalities of breathing: Secondary | ICD-10-CM | POA: Diagnosis not present

## 2019-11-20 DIAGNOSIS — Z87891 Personal history of nicotine dependence: Secondary | ICD-10-CM | POA: Diagnosis not present

## 2019-11-20 DIAGNOSIS — R0689 Other abnormalities of breathing: Secondary | ICD-10-CM | POA: Diagnosis not present

## 2019-12-11 DIAGNOSIS — Z9484 Stem cells transplant status: Secondary | ICD-10-CM | POA: Diagnosis not present

## 2019-12-11 DIAGNOSIS — C8318 Mantle cell lymphoma, lymph nodes of multiple sites: Secondary | ICD-10-CM | POA: Diagnosis not present

## 2020-01-07 DIAGNOSIS — R69 Illness, unspecified: Secondary | ICD-10-CM | POA: Diagnosis not present

## 2020-01-12 IMAGING — CT CT BIOPSY
1 of 4 series · 14 of 32 positions shown, 19 images · non-contrast
Comparison: none

INDICATION: 68-year-old with lymphadenopathy and splenomegaly. Concern for CLL
or lymphoma.

[Series 2: i-spiral 5.0 b31f · axial · 0.98mm/px · z∈[-262,-24]mm · 14 of 78 slices shown, 19 images]
[im 5/78  soft-tissue]
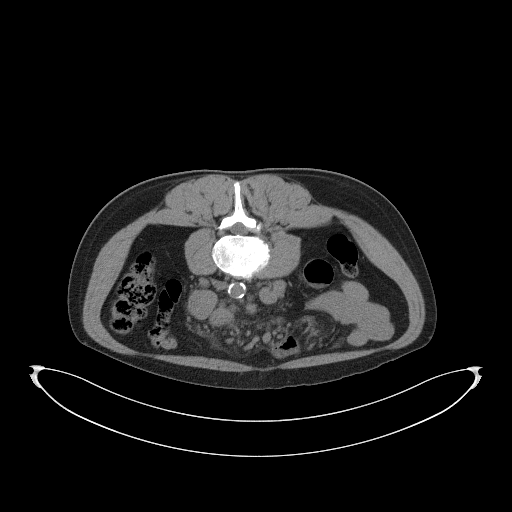
[im 5/78  bone]
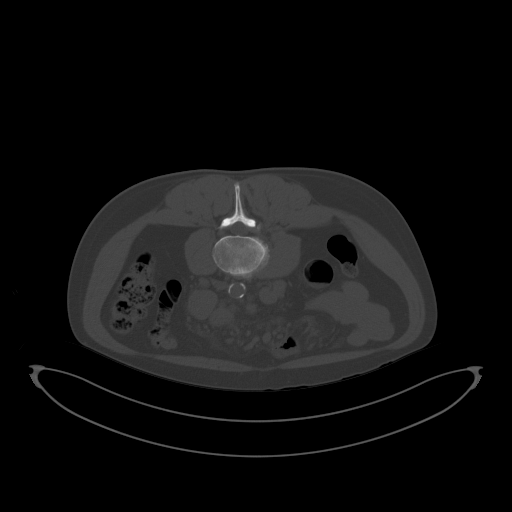
[im 10/78  soft-tissue]
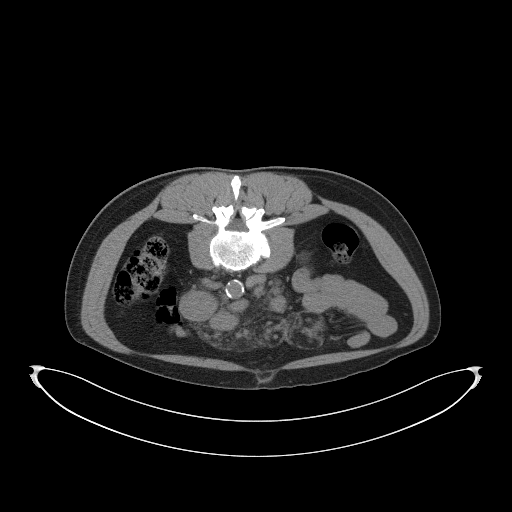
[im 19/78  soft-tissue]
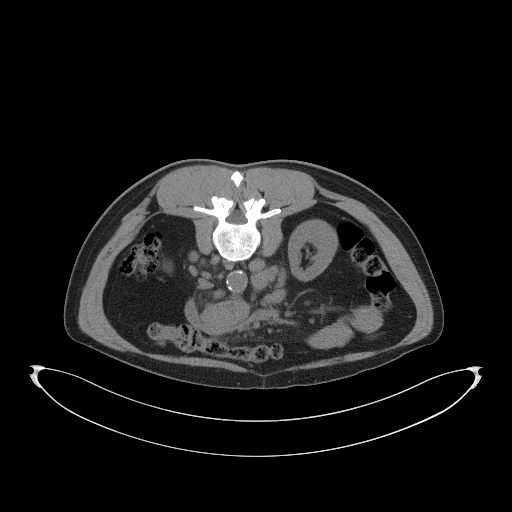
[im 23/78  soft-tissue]
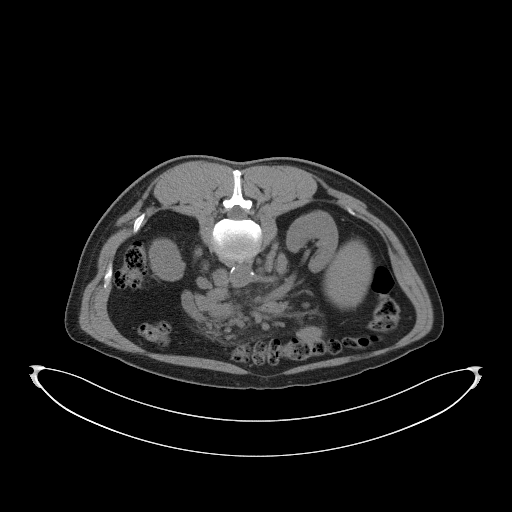
[im 28/78  soft-tissue]
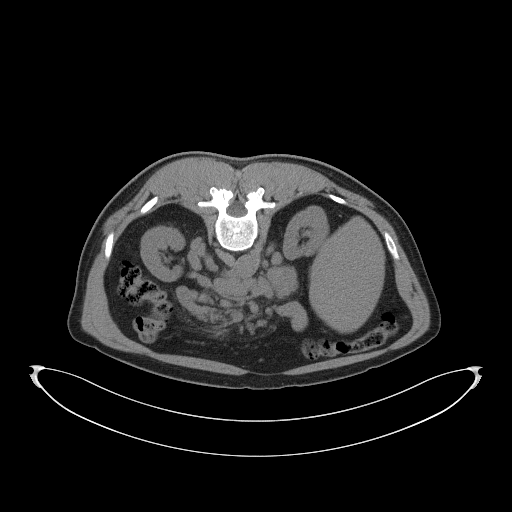
[im 32/78  soft-tissue]
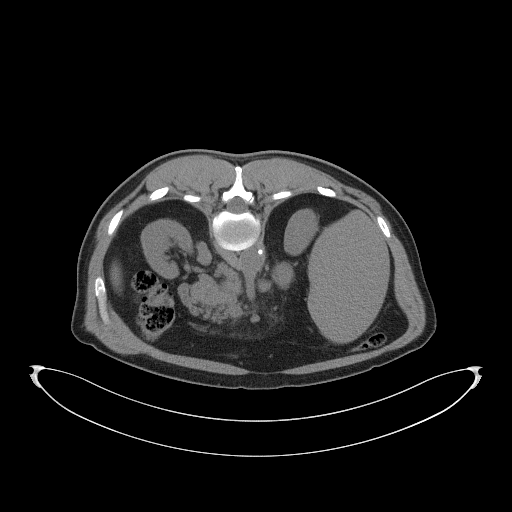
[im 41/78  soft-tissue]
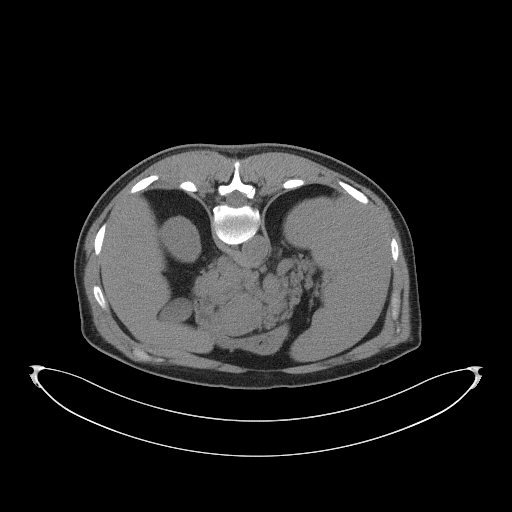
[im 46/78  soft-tissue]
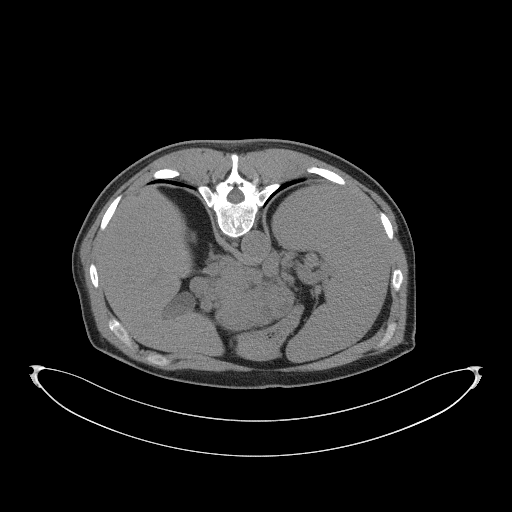
[im 50/78  soft-tissue]
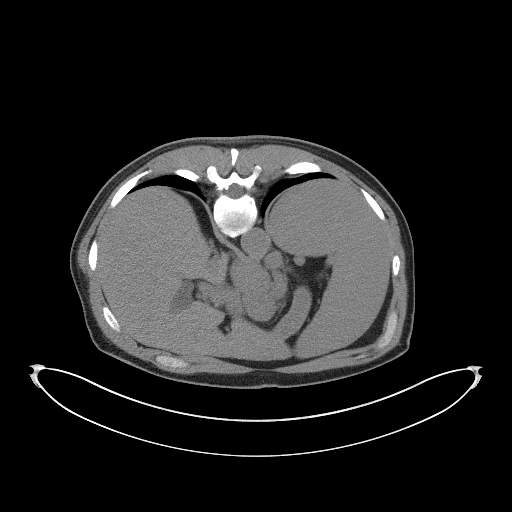
[im 50/78  bone]
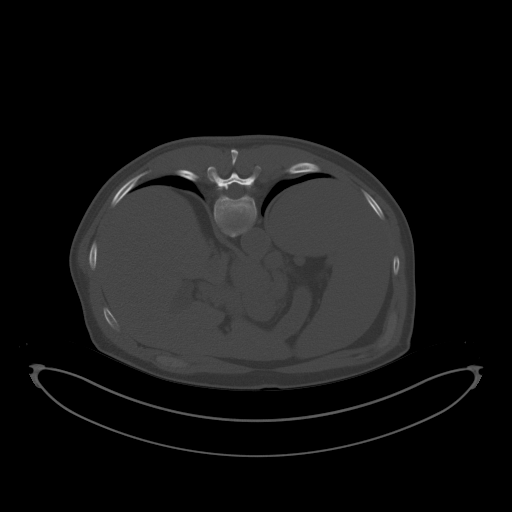
[im 55/78  soft-tissue]
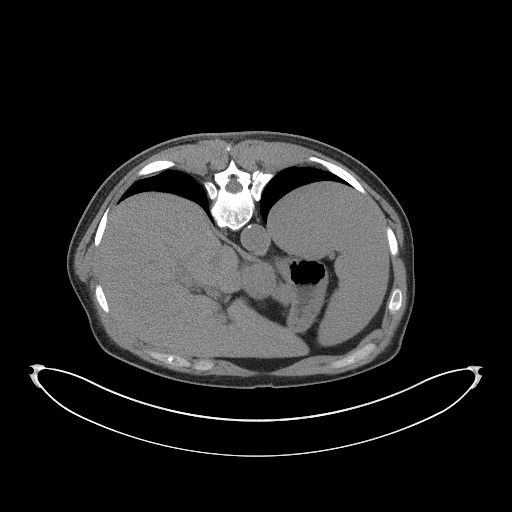
[im 59/78  soft-tissue]
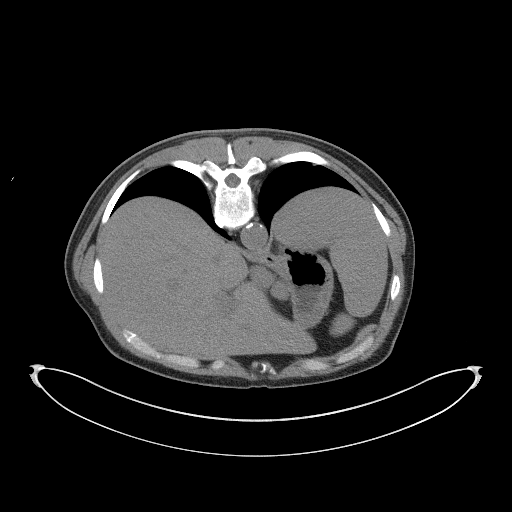
[im 59/78  lung]
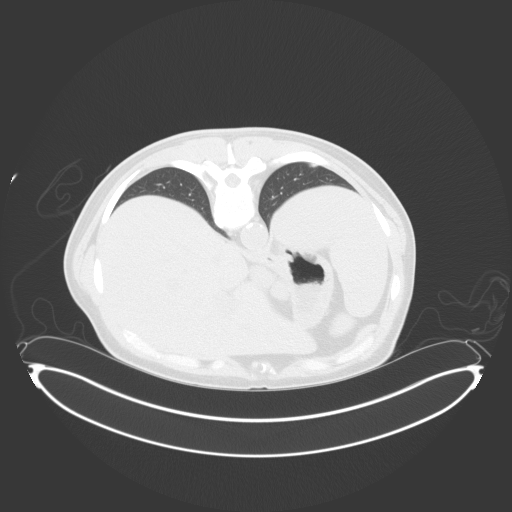
[im 64/78  lung]
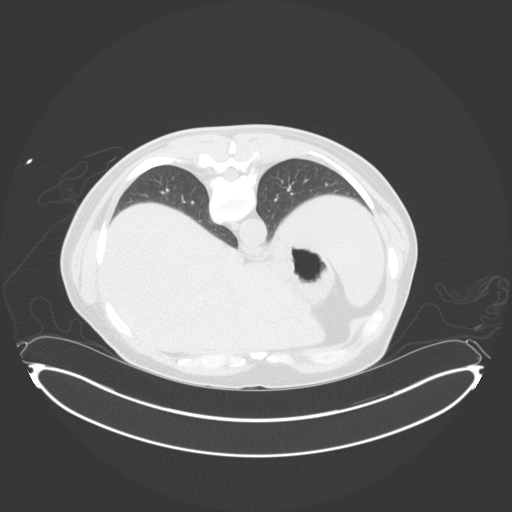
[im 68/78  soft-tissue]
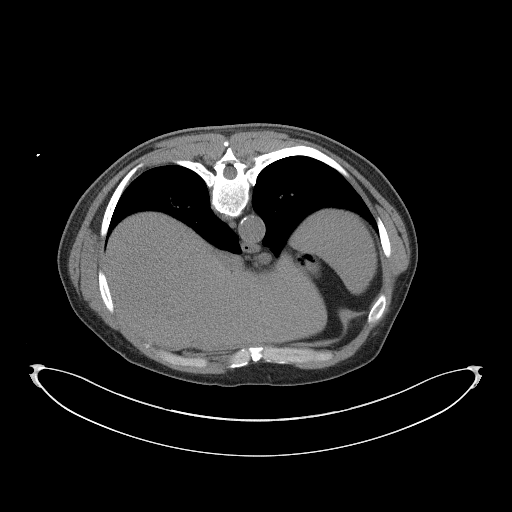
[im 68/78  lung]
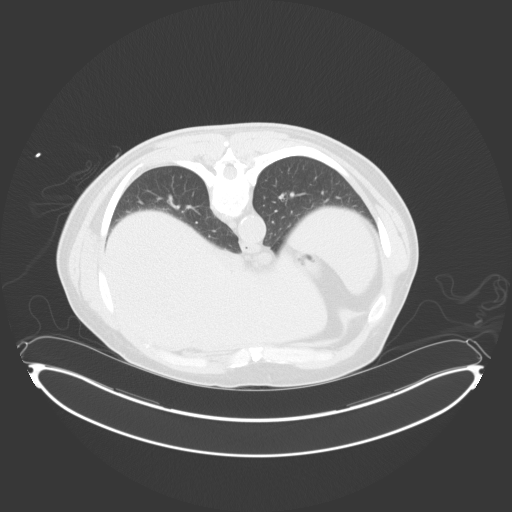
[im 73/78  soft-tissue]
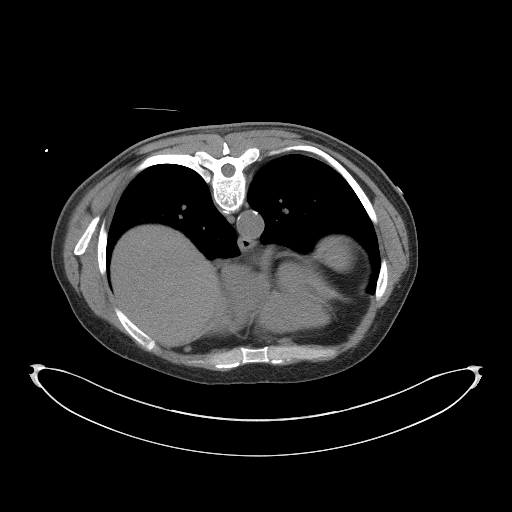
[im 73/78  lung]
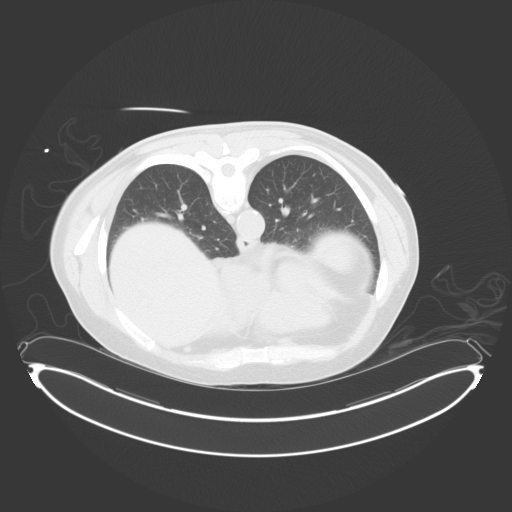

[14 of 32 positions shown; findings below may reference images not displayed]

EXAM:
CT-GUIDED BIOPSY OF LEFT RETROPERITONEAL LYMPH NODE

MEDICATIONS:
None.

ANESTHESIA/SEDATION:
Moderate (conscious) sedation was employed during this procedure. A
total of Versed 3.0 mg and Fentanyl 100 mcg was administered
intravenously.

Moderate Sedation Time: 18 minutes. The patient's level of
consciousness and vital signs were monitored continuously by
radiology nursing throughout the procedure under my direct
supervision.

FLUOROSCOPY TIME:  None

COMPLICATIONS:
None immediate.

PROCEDURE:
Informed written consent was obtained from the patient after a
thorough discussion of the procedural risks, benefits and
alternatives. All questions were addressed. A timeout was performed
prior to the initiation of the procedure.

Patient was placed prone. CT images through the abdomen were
obtained. Infrarenal left retroperitoneal lymph node was targeted
for biopsy. The left side of the back was shaved. The skin was
prepped with chlorhexidine and a sterile field was created. Skin and
soft tissues anesthetized with 1% lidocaine. Using CT guidance, 17
gauge coaxial needle was directed into a left retroperitoneal lymph
node. Needle position confirmed along the posterior aspect of the
lymph node. Total of 6 core biopsies were performed with an 18 gauge
device. Three adequate specimens were obtained and placed in saline.
Coaxial needle was removed without complication. Bandage placed over
the puncture site.
FINDINGS: Several enlarged retroperitoneal lymph nodes. Needle position
confirmed within a left retroperitoneal lymph node. Small amount of
gas around the lymph node after the core biopsies. Three adequate
core specimens were obtained and placed in saline.
IMPRESSION: Successful CT-guided core biopsies of an enlarged left
retroperitoneal lymph node.

## 2020-01-27 DIAGNOSIS — Z9484 Stem cells transplant status: Secondary | ICD-10-CM | POA: Diagnosis not present

## 2020-01-27 DIAGNOSIS — Z23 Encounter for immunization: Secondary | ICD-10-CM | POA: Diagnosis not present

## 2020-01-27 DIAGNOSIS — Z79899 Other long term (current) drug therapy: Secondary | ICD-10-CM | POA: Diagnosis not present

## 2020-01-27 DIAGNOSIS — L989 Disorder of the skin and subcutaneous tissue, unspecified: Secondary | ICD-10-CM | POA: Diagnosis not present

## 2020-01-27 DIAGNOSIS — R590 Localized enlarged lymph nodes: Secondary | ICD-10-CM | POA: Diagnosis not present

## 2020-01-27 DIAGNOSIS — D61818 Other pancytopenia: Secondary | ICD-10-CM | POA: Diagnosis not present

## 2020-01-27 DIAGNOSIS — N189 Chronic kidney disease, unspecified: Secondary | ICD-10-CM | POA: Diagnosis not present

## 2020-01-27 DIAGNOSIS — C8310 Mantle cell lymphoma, unspecified site: Secondary | ICD-10-CM | POA: Diagnosis not present

## 2020-02-05 DIAGNOSIS — C8318 Mantle cell lymphoma, lymph nodes of multiple sites: Secondary | ICD-10-CM | POA: Diagnosis not present

## 2020-02-05 DIAGNOSIS — Z5111 Encounter for antineoplastic chemotherapy: Secondary | ICD-10-CM | POA: Diagnosis not present

## 2020-03-25 DIAGNOSIS — H5201 Hypermetropia, right eye: Secondary | ICD-10-CM | POA: Diagnosis not present

## 2020-03-25 DIAGNOSIS — H25013 Cortical age-related cataract, bilateral: Secondary | ICD-10-CM | POA: Diagnosis not present

## 2020-03-25 DIAGNOSIS — H53032 Strabismic amblyopia, left eye: Secondary | ICD-10-CM | POA: Diagnosis not present

## 2020-03-25 DIAGNOSIS — H52223 Regular astigmatism, bilateral: Secondary | ICD-10-CM | POA: Diagnosis not present

## 2020-03-29 ENCOUNTER — Ambulatory Visit (INDEPENDENT_AMBULATORY_CARE_PROVIDER_SITE_OTHER): Payer: Medicare HMO | Admitting: Family Medicine

## 2020-03-29 ENCOUNTER — Other Ambulatory Visit: Payer: Self-pay

## 2020-03-29 VITALS — BP 128/76 | HR 77 | Temp 96.9°F

## 2020-03-29 DIAGNOSIS — M5136 Other intervertebral disc degeneration, lumbar region: Secondary | ICD-10-CM | POA: Diagnosis not present

## 2020-03-29 DIAGNOSIS — M5432 Sciatica, left side: Secondary | ICD-10-CM

## 2020-03-29 MED ORDER — HYDROCODONE-ACETAMINOPHEN 5-325 MG PO TABS: 1.0000 | ORAL_TABLET | Freq: Four times a day (QID) | ORAL | 0 refills | Status: AC | PRN

## 2020-03-29 MED ORDER — METHOCARBAMOL 500 MG PO TABS: 500.0000 mg | ORAL_TABLET | Freq: Three times a day (TID) | ORAL | 0 refills | Status: DC | PRN

## 2020-03-29 MED ORDER — METHYLPREDNISOLONE ACETATE 40 MG/ML IJ SUSP
40.0000 mg | Freq: Once | INTRAMUSCULAR | Status: AC
  Administered 2020-03-29: 40 mg via INTRAMUSCULAR

## 2020-03-29 NOTE — Progress Notes (Signed)
Subjective: CC: low back pain PCP: Dettinger, Fransisca Kaufmann, MD ZDG:LOVFIE Shawn Cline is a 69 y.o. male who is accompanied by his wife today.  He is presenting to clinic today for:  1.  Low back pain Patient reports onset of acute low back pain yesterday afternoon.  Over the weekend he had been carrying around a heavy amplifier as well as changed a car battery.  He reports left lower extremity weakness, left-sided hip pain that radiates from the low back/buttock area down the leg.  Pain is a 10 out of 10.  It certainly worse than what he experienced 2 years ago.  No therapies used thus far but he did respond well to previous regimen.  He denies any saddle anesthesia, fecal incontinence or urinary retention.  Medical history significant for CKD 3 and mantle cell lymphoma.  Last white blood cell count was within normal range.  Renal function was also stable.   ROS: Per HPI  No Known Allergies Past Medical History:  Diagnosis Date  . Hypercholesteremia     Current Outpatient Medications:  .  acyclovir (ZOVIRAX) 400 MG tablet, Take 800 mg by mouth in the morning and at bedtime., Disp: , Rfl:  .  aspirin EC 81 MG tablet, Take 81 mg by mouth daily., Disp: , Rfl:  .  cholecalciferol (VITAMIN D) 1000 units tablet, Take 1 tablet (1,000 Units total) by mouth daily., Disp: 90 tablet, Rfl: 3 .  folic acid (FOLVITE) 1 MG tablet, Take 1 mg by mouth daily., Disp: , Rfl:  .  loratadine (CLARITIN REDITABS) 10 MG dissolvable tablet, Take by mouth., Disp: , Rfl:  .  Melatonin 5 MG TABS, Take by mouth., Disp: , Rfl:  .  metroNIDAZOLE (METROGEL) 1 % gel, Apply topically daily., Disp: 60 g, Rfl: 3 .  Multiple Vitamin (MULTIVITAMIN) capsule, Take 1 capsule by mouth daily., Disp: , Rfl:  .  omeprazole (PRILOSEC) 20 MG capsule, Take 20 mg by mouth daily., Disp: , Rfl:  .  simvastatin (ZOCOR) 40 MG tablet, Take 1 tablet (40 mg total) by mouth daily at 6 PM. (please make yearly appt), Disp: 90 tablet, Rfl: 3 .   sulfamethoxazole-trimethoprim (BACTRIM DS) 800-160 MG tablet, Take 1 tablet by mouth 3 (three) times a week., Disp: , Rfl:  Social History   Socioeconomic History  . Marital status: Married    Spouse name: Not on file  . Number of children: 1  . Years of education: 69  . Highest education level: Some college, no degree  Occupational History  . Not on file  Tobacco Use  . Smoking status: Former Smoker    Quit date: 12/03/1991    Years since quitting: 28.3  . Smokeless tobacco: Never Used  Vaping Use  . Vaping Use: Never used  Substance and Sexual Activity  . Alcohol use: No  . Drug use: No  . Sexual activity: Not Currently  Other Topics Concern  . Not on file  Social History Narrative  . Not on file   Social Determinants of Health   Financial Resource Strain: Not on file  Food Insecurity: Not on file  Transportation Needs: Not on file  Physical Activity: Not on file  Stress: Not on file  Social Connections: Not on file  Intimate Partner Violence: Not on file   Family History  Problem Relation Age of Onset  . Atrial fibrillation Mother   . Dementia Mother   . Cancer Father        lung  Objective: Office vital signs reviewed. BP 128/76   Pulse 77   Temp (!) 96.9 F (36.1 C) (Temporal)   SpO2 97%   Physical Examination:  General: Awake, alert, appears uncomfortable. MSK: Antalgic gait and station  Lumbar spine: Active range of motion is limited secondary to pain.  No midline tenderness palpation.  No paraspinal muscle tenderness palpation.  He does have tenderness to palpation in the gluteus.  He has negative straight leg raise.  No palpable bony or muscular abnormalities Skin: dry; intact; no rashes or lesions Neuro: 4/5 LLE Strength and intact light touch sensation to LEs  Assessment/ Plan: 69 y.o. male   Sciatic pain, left - Plan: methylPREDNISolone acetate (DEPO-MEDROL) injection 40 mg, methocarbamol (ROBAXIN) 500 MG tablet, HYDROcodone-acetaminophen  (NORCO) 5-325 MG tablet  DDD (degenerative disc disease), lumbar - Plan: methylPREDNISolone acetate (DEPO-MEDROL) injection 40 mg, methocarbamol (ROBAXIN) 500 MG tablet, HYDROcodone-acetaminophen (NORCO) 5-325 MG tablet  Treated with 1 shot of Depo 40.  I have given him a muscle relaxer to have on hand as well as Norco.  I hesitated to do any NSAIDs given impaired renal function and I certainly did not want to prolong any steroid course given history of lymphoma.  I have reviewed his previous imaging which did show degenerative changes within both the thoracic and lumbar spine.  This likely explains his current exacerbation.  We discussed red flag signs and symptoms warranting emergent surgical evaluation.  Both he and his wife was good understanding  Home physical therapy exercises were provided to the patient.  The national narcotic database was reviewed and there were no red flags.  I have asked that he follow-up with his PCP if symptoms are ongoing, worsening or if they recur.  No orders of the defined types were placed in this encounter.  No orders of the defined types were placed in this encounter.    Janora Norlander, DO [place] [phone]

## 2020-03-29 NOTE — Patient Instructions (Signed)
Use medication only as directed. Caution sedation/ addiction See Dr Warrick Parisian if symptoms persist or recur

## 2020-05-13 ENCOUNTER — Other Ambulatory Visit: Payer: Self-pay

## 2020-05-13 ENCOUNTER — Ambulatory Visit (INDEPENDENT_AMBULATORY_CARE_PROVIDER_SITE_OTHER): Payer: Medicare HMO

## 2020-05-13 DIAGNOSIS — Z23 Encounter for immunization: Secondary | ICD-10-CM

## 2020-05-26 DIAGNOSIS — Z8249 Family history of ischemic heart disease and other diseases of the circulatory system: Secondary | ICD-10-CM | POA: Diagnosis not present

## 2020-05-26 DIAGNOSIS — Z7982 Long term (current) use of aspirin: Secondary | ICD-10-CM | POA: Diagnosis not present

## 2020-05-26 DIAGNOSIS — E785 Hyperlipidemia, unspecified: Secondary | ICD-10-CM | POA: Diagnosis not present

## 2020-05-27 DIAGNOSIS — Z79899 Other long term (current) drug therapy: Secondary | ICD-10-CM | POA: Diagnosis not present

## 2020-05-27 DIAGNOSIS — C8318 Mantle cell lymphoma, lymph nodes of multiple sites: Secondary | ICD-10-CM | POA: Diagnosis not present

## 2020-06-01 ENCOUNTER — Ambulatory Visit (INDEPENDENT_AMBULATORY_CARE_PROVIDER_SITE_OTHER): Payer: Medicare HMO | Admitting: Nurse Practitioner

## 2020-06-01 ENCOUNTER — Encounter: Payer: Self-pay | Admitting: Nurse Practitioner

## 2020-06-01 VITALS — Temp 100.8°F

## 2020-06-01 DIAGNOSIS — J069 Acute upper respiratory infection, unspecified: Secondary | ICD-10-CM | POA: Diagnosis not present

## 2020-06-01 MED ORDER — AMOXICILLIN-POT CLAVULANATE 875-125 MG PO TABS: 1.0000 | ORAL_TABLET | Freq: Two times a day (BID) | ORAL | 0 refills | Status: DC

## 2020-06-01 MED ORDER — DM-GUAIFENESIN ER 30-600 MG PO TB12: 1.0000 | ORAL_TABLET | Freq: Two times a day (BID) | ORAL | 0 refills | Status: DC

## 2020-06-01 NOTE — Assessment & Plan Note (Signed)
Worsening upper respiratory tract infection symptoms in the last 2 weeks.  Patient is reporting worsening cough, hoarseness, fever 100.9-100.8 this morning.  Increased drainage, and headache. COVID-19 swab ordered results pending.  Encourage patient to push fluids, monitor oxygen saturation, guaifenesin for cough and congestion, started patient on Augmentin. Education provided patient verbalized understanding. Rx sent to pharmacy Follow-up with worsening or unresolved symptoms.

## 2020-06-01 NOTE — Progress Notes (Signed)
   Virtual Visit via telephone Note Due to COVID-19 pandemic this visit was conducted virtually. This visit type was conducted due to national recommendations for restrictions regarding the COVID-19 Pandemic (e.g. social distancing, sheltering in place) in an effort to limit this patient's exposure and mitigate transmission in our community. All issues noted in this document were discussed and addressed.  A physical exam was not performed with this format.  I connected with Shawn Cline on 06/01/20 at 08:30 am by telephone and verified that I am speaking with the correct person using two identifiers. Shawn Cline is currently located at home during visit. The provider, Ivy Lynn, NP is located in their office at time of visit.  I discussed the limitations, risks, security and privacy concerns of performing an evaluation and management service by telephone and the availability of in person appointments. I also discussed with the patient that there may be a patient responsible charge related to this service. The patient expressed understanding and agreed to proceed.   History and Present Illness:  URI  This is a recurrent problem. The current episode started 1 to 4 weeks ago. The problem has been unchanged. Maximum temperature: 100.8-100.9. The fever has been present for 1 to 2 days. Associated symptoms include congestion, coughing and headaches. Pertinent negatives include no abdominal pain, chest pain, nausea, sinus pain, sore throat, vomiting or wheezing. He has tried nothing for the symptoms.      Review of Systems  Constitutional: Positive for fever.  HENT: Positive for congestion. Negative for hearing loss, sinus pain and sore throat.   Respiratory: Positive for cough. Negative for wheezing.   Cardiovascular: Negative for chest pain.  Gastrointestinal: Negative for abdominal pain, nausea and vomiting.  Genitourinary: Negative.   Neurological: Positive for headaches.  All  other systems reviewed and are negative.    Observations/Objective: Tele visit, patient did not sound to be in distress  Assessment and Plan:  Upper respiratory tract infection Worsening upper respiratory tract infection symptoms in the last 2 weeks.  Patient is reporting worsening cough, hoarseness, fever 100.9-100.8 this morning.  Increased drainage, and headache. COVID-19 swab ordered results pending.  Encourage patient to push fluids, monitor oxygen saturation, guaifenesin for cough and congestion, started patient on Augmentin. Education provided patient verbalized understanding. Rx sent to pharmacy Follow-up with worsening or unresolved symptoms. Follow Up Instructions: Follow-up with unresolved symptoms    I discussed the assessment and treatment plan with the patient. The patient was provided an opportunity to ask questions and all were answered. The patient agreed with the plan and demonstrated an understanding of the instructions.   The patient was advised to call back or seek an in-person evaluation if the symptoms worsen or if the condition fails to improve as anticipated.  The above assessment and management plan was discussed with the patient. The patient verbalized understanding of and has agreed to the management plan. Patient is aware to call the clinic if symptoms persist or worsen. Patient is aware when to return to the clinic for a follow-up visit. Patient educated on when it is appropriate to go to the emergency department.   Time call ended: 8:31 AM  I provided 11 minutes of non-face-to-face time during this encounter.    Ivy Lynn, NP

## 2020-06-02 LAB — NOVEL CORONAVIRUS, NAA: SARS-CoV-2, NAA: NOT DETECTED

## 2020-06-02 LAB — SARS-COV-2, NAA 2 DAY TAT

## 2020-06-28 ENCOUNTER — Other Ambulatory Visit: Payer: Self-pay | Admitting: Family Medicine

## 2020-06-28 DIAGNOSIS — E782 Mixed hyperlipidemia: Secondary | ICD-10-CM

## 2020-06-29 DIAGNOSIS — R0609 Other forms of dyspnea: Secondary | ICD-10-CM | POA: Diagnosis not present

## 2020-06-29 DIAGNOSIS — Z87891 Personal history of nicotine dependence: Secondary | ICD-10-CM | POA: Diagnosis not present

## 2020-06-29 DIAGNOSIS — R059 Cough, unspecified: Secondary | ICD-10-CM | POA: Diagnosis not present

## 2020-07-03 DIAGNOSIS — R942 Abnormal results of pulmonary function studies: Secondary | ICD-10-CM | POA: Diagnosis not present

## 2020-07-07 ENCOUNTER — Telehealth: Payer: Self-pay

## 2020-07-07 DIAGNOSIS — N1831 Chronic kidney disease, stage 3a: Secondary | ICD-10-CM

## 2020-07-07 DIAGNOSIS — E782 Mixed hyperlipidemia: Secondary | ICD-10-CM

## 2020-07-07 DIAGNOSIS — Z125 Encounter for screening for malignant neoplasm of prostate: Secondary | ICD-10-CM

## 2020-07-08 NOTE — Telephone Encounter (Signed)
Patient aware.

## 2020-07-08 NOTE — Telephone Encounter (Signed)
Placed lab orders for patient 

## 2020-07-13 ENCOUNTER — Other Ambulatory Visit: Payer: Medicare HMO

## 2020-07-13 ENCOUNTER — Other Ambulatory Visit: Payer: Self-pay

## 2020-07-13 DIAGNOSIS — E782 Mixed hyperlipidemia: Secondary | ICD-10-CM

## 2020-07-13 DIAGNOSIS — Z125 Encounter for screening for malignant neoplasm of prostate: Secondary | ICD-10-CM

## 2020-07-13 DIAGNOSIS — N1831 Chronic kidney disease, stage 3a: Secondary | ICD-10-CM

## 2020-07-14 LAB — LIPID PANEL
Chol/HDL Ratio: 2.7 ratio (ref 0.0–5.0)
Cholesterol, Total: 127 mg/dL (ref 100–199)
HDL: 47 mg/dL (ref >39)
LDL Chol Calc (NIH): 66 mg/dL (ref 0–99)
Triglycerides: 66 mg/dL (ref 0–149)
VLDL Cholesterol Cal: 14 mg/dL (ref 5–40)

## 2020-07-14 LAB — CBC WITH DIFFERENTIAL/PLATELET
Basophils Absolute: 0 10*3/uL (ref 0.0–0.2)
Basos: 1 %
EOS (ABSOLUTE): 0.2 10*3/uL (ref 0.0–0.4)
Eos: 3 %
Hematocrit: 38.4 % (ref 37.5–51.0)
Hemoglobin: 13.4 g/dL (ref 13.0–17.7)
Immature Grans (Abs): 0 10*3/uL (ref 0.0–0.1)
Immature Granulocytes: 0 %
Lymphocytes Absolute: 0.8 10*3/uL (ref 0.7–3.1)
Lymphs: 15 %
MCH: 33.4 pg — ABNORMAL HIGH (ref 26.6–33.0)
MCHC: 34.9 g/dL (ref 31.5–35.7)
MCV: 96 fL (ref 79–97)
Monocytes Absolute: 0.8 10*3/uL (ref 0.1–0.9)
Monocytes: 15 %
Neutrophils Absolute: 3.6 10*3/uL (ref 1.4–7.0)
Neutrophils: 66 %
Platelets: 153 10*3/uL (ref 150–450)
RBC: 4.01 x10E6/uL — ABNORMAL LOW (ref 4.14–5.80)
RDW: 11.9 % (ref 11.6–15.4)
WBC: 5.4 10*3/uL (ref 3.4–10.8)

## 2020-07-14 LAB — CMP14+EGFR
ALT: 25 IU/L (ref 0–44)
AST: 22 IU/L (ref 0–40)
Albumin/Globulin Ratio: 2.4 — ABNORMAL HIGH (ref 1.2–2.2)
Albumin: 4.4 g/dL (ref 3.8–4.8)
Alkaline Phosphatase: 59 IU/L (ref 44–121)
BUN/Creatinine Ratio: 15 (ref 10–24)
BUN: 20 mg/dL (ref 8–27)
Bilirubin Total: 0.2 mg/dL (ref 0.0–1.2)
CO2: 23 mmol/L (ref 20–29)
Calcium: 9.3 mg/dL (ref 8.6–10.2)
Chloride: 103 mmol/L (ref 96–106)
Creatinine, Ser: 1.36 mg/dL — ABNORMAL HIGH (ref 0.76–1.27)
Globulin, Total: 1.8 g/dL (ref 1.5–4.5)
Glucose: 113 mg/dL — ABNORMAL HIGH (ref 65–99)
Potassium: 4 mmol/L (ref 3.5–5.2)
Sodium: 143 mmol/L (ref 134–144)
Total Protein: 6.2 g/dL (ref 6.0–8.5)
eGFR: 56 mL/min/{1.73_m2} — ABNORMAL LOW (ref >59)

## 2020-07-14 LAB — PSA, TOTAL AND FREE
PSA, Free Pct: 30 %
PSA, Free: 0.21 ng/mL
Prostate Specific Ag, Serum: 0.7 ng/mL (ref 0.0–4.0)

## 2020-07-18 DIAGNOSIS — Z9484 Stem cells transplant status: Secondary | ICD-10-CM | POA: Diagnosis not present

## 2020-07-18 DIAGNOSIS — R590 Localized enlarged lymph nodes: Secondary | ICD-10-CM | POA: Diagnosis not present

## 2020-07-18 DIAGNOSIS — C8318 Mantle cell lymphoma, lymph nodes of multiple sites: Secondary | ICD-10-CM | POA: Diagnosis not present

## 2020-07-18 DIAGNOSIS — C8310 Mantle cell lymphoma, unspecified site: Secondary | ICD-10-CM | POA: Diagnosis not present

## 2020-07-20 DIAGNOSIS — C8310 Mantle cell lymphoma, unspecified site: Secondary | ICD-10-CM | POA: Diagnosis not present

## 2020-07-21 ENCOUNTER — Ambulatory Visit (INDEPENDENT_AMBULATORY_CARE_PROVIDER_SITE_OTHER): Payer: Medicare HMO | Admitting: Family Medicine

## 2020-07-21 ENCOUNTER — Encounter: Payer: Self-pay | Admitting: Family Medicine

## 2020-07-21 ENCOUNTER — Other Ambulatory Visit: Payer: Self-pay

## 2020-07-21 VITALS — BP 121/77 | HR 72 | Ht 65.0 in | Wt 182.0 lb

## 2020-07-21 DIAGNOSIS — E663 Overweight: Secondary | ICD-10-CM

## 2020-07-21 DIAGNOSIS — Z0000 Encounter for general adult medical examination without abnormal findings: Secondary | ICD-10-CM

## 2020-07-21 DIAGNOSIS — Z0001 Encounter for general adult medical examination with abnormal findings: Secondary | ICD-10-CM

## 2020-07-21 DIAGNOSIS — E782 Mixed hyperlipidemia: Secondary | ICD-10-CM

## 2020-07-21 DIAGNOSIS — N1831 Chronic kidney disease, stage 3a: Secondary | ICD-10-CM | POA: Diagnosis not present

## 2020-07-21 DIAGNOSIS — Z23 Encounter for immunization: Secondary | ICD-10-CM

## 2020-07-21 MED ORDER — SIMVASTATIN 40 MG PO TABS: 40.0000 mg | ORAL_TABLET | ORAL | 3 refills | Status: DC

## 2020-07-21 NOTE — Addendum Note (Signed)
Addended by: Alphonzo Dublin on: 07/21/2020 10:04 AM   Modules accepted: Orders

## 2020-07-21 NOTE — Progress Notes (Signed)
BP 121/77   Pulse 72   Ht '5\' 5"'  (1.651 m)   Wt 182 lb (82.6 kg)   SpO2 98%   BMI 30.29 kg/m    Subjective:   Patient ID: Shawn Cline, male    DOB: 1950/10/20, 70 y.o.   MRN: [id number]  HPI: Shawn Cline is a 70 y.o. male presenting on 07/21/2020 for Medical Management of Chronic Issues and Hyperlipidemia   HPI Adult well exam and physical Patient is coming in for recheck evaluation and physical exam.  He says he did have a stem cell transplant and is doing chemotherapy for maintenance for that.  He says he is doing very well. Patient denies any chest pain, shortness of breath, headaches or vision issues, abdominal complaints, diarrhea, nausea, vomiting, or joint issues.  He still has an umbilical hernia but does not cause any pain.  He has stable CKD 3.  Hyperlipidemia Patient is coming in for recheck of his hyperlipidemia. The patient is currently taking simvastatin, wants to go to every other day and will do so.. They deny any issues with myalgias or history of liver damage from it. They deny any focal numbness or weakness or chest pain.   Relevant past medical, surgical, family and social history reviewed and updated as indicated. Interim medical history since our last visit reviewed. Allergies and medications reviewed and updated.  Review of Systems  Constitutional: Negative for chills and fever.  HENT: Negative for ear pain and tinnitus.   Eyes: Negative for pain and discharge.  Respiratory: Negative for cough, shortness of breath and wheezing.   Cardiovascular: Negative for chest pain, palpitations and leg swelling.  Gastrointestinal: Negative for abdominal pain, blood in stool, constipation and diarrhea.  Genitourinary: Negative for dysuria and hematuria.  Musculoskeletal: Negative for back pain, gait problem and myalgias.  Skin: Negative for rash.  Neurological: Negative for dizziness, weakness and headaches.  Psychiatric/Behavioral: Negative for suicidal ideas.   All other systems reviewed and are negative.   Per HPI unless specifically indicated above   Allergies as of 07/21/2020   No Known Allergies     Medication List       Accurate as of July 21, 2020  9:29 AM. If you have any questions, ask your nurse or doctor.        STOP taking these medications   acyclovir 400 MG tablet Commonly known as: ZOVIRAX Stopped by: Worthy Rancher, MD   amoxicillin-clavulanate 875-125 MG tablet Commonly known as: Augmentin Stopped by: Worthy Rancher, MD   dextromethorphan-guaiFENesin 30-600 MG 12hr tablet Commonly known as: Archbald DM Stopped by: Worthy Rancher, MD   folic acid 1 MG tablet Commonly known as: FOLVITE Stopped by: Worthy Rancher, MD   methocarbamol 500 MG tablet Commonly known as: Robaxin Stopped by: Fransisca Kaufmann Moyinoluwa Dawe, MD   metroNIDAZOLE 1 % gel Commonly known as: METROGEL Stopped by: Worthy Rancher, MD   multivitamin capsule Stopped by: Worthy Rancher, MD   sulfamethoxazole-trimethoprim 800-160 MG tablet Commonly known as: BACTRIM DS Stopped by: Fransisca Kaufmann Arnika Larzelere, MD     TAKE these medications   aspirin EC 81 MG tablet Take 81 mg by mouth daily.   cholecalciferol 25 MCG (1000 UNIT) tablet Commonly known as: VITAMIN D Take 1 tablet (1,000 Units total) by mouth daily.   loratadine 10 MG dissolvable tablet Commonly known as: CLARITIN REDITABS Take by mouth.   melatonin 5 MG Tabs Take by mouth.   omeprazole 20 MG capsule Commonly  known as: PRILOSEC Take 20 mg by mouth daily.   simvastatin 40 MG tablet Commonly known as: ZOCOR Take 1 tablet (40 mg total) by mouth every other day. What changed:   when to take this  additional instructions Changed by: Fransisca Kaufmann Karmon Andis, MD        Objective:   BP 121/77   Pulse 72   Ht '5\' 5"'  (1.651 m)   Wt 182 lb (82.6 kg)   SpO2 98%   BMI 30.29 kg/m   Wt Readings from Last 3 Encounters:  07/21/20 182 lb (82.6 kg)  06/13/18 168 lb  4.8 oz (76.3 kg)  06/12/18 168 lb 9.6 oz (76.5 kg)    Physical Exam Vitals and nursing note reviewed.  Constitutional:      General: He is not in acute distress.    Appearance: He is well-developed. He is not diaphoretic.  HENT:     Right Ear: External ear normal.     Left Ear: External ear normal.     Nose: Nose normal.     Mouth/Throat:     Pharynx: No oropharyngeal exudate.  Eyes:     General: No scleral icterus.       Right eye: No discharge.        Left eye: No discharge.     Extraocular Movements: Extraocular movements intact.     Conjunctiva/sclera: Conjunctivae normal.     Pupils: Pupils are equal, round, and reactive to light.  Neck:     Thyroid: No thyromegaly.  Cardiovascular:     Rate and Rhythm: Normal rate and regular rhythm.     Heart sounds: Normal heart sounds. No murmur heard.   Pulmonary:     Effort: Pulmonary effort is normal. No respiratory distress.     Breath sounds: Normal breath sounds. No wheezing.  Abdominal:     General: Bowel sounds are normal. There is no distension.     Palpations: Abdomen is soft.     Tenderness: There is no abdominal tenderness. There is no guarding or rebound.  Musculoskeletal:        General: Normal range of motion.     Cervical back: Neck supple.  Lymphadenopathy:     Cervical: No cervical adenopathy.  Skin:    General: Skin is warm and dry.     Findings: No rash.  Neurological:     Mental Status: He is alert and oriented to person, place, and time.     Coordination: Coordination normal.  Psychiatric:        Behavior: Behavior normal.     Results for orders placed or performed in visit on 07/13/20  PSA, total and free  Result Value Ref Range   Prostate Specific Ag, Serum 0.7 0.0 - 4.0 ng/mL   PSA, Free 0.21 N/A ng/mL   PSA, Free Pct 30.0 %  Lipid panel  Result Value Ref Range   Cholesterol, Total 127 100 - 199 mg/dL   Triglycerides 66 0 - 149 mg/dL   HDL 47 >39 mg/dL   VLDL Cholesterol Cal 14 5 - 40  mg/dL   LDL Chol Calc (NIH) 66 0 - 99 mg/dL   Chol/HDL Ratio 2.7 0.0 - 5.0 ratio  CMP14+EGFR  Result Value Ref Range   Glucose 113 (H) 65 - 99 mg/dL   BUN 20 8 - 27 mg/dL   Creatinine, Ser 1.36 (H) 0.76 - 1.27 mg/dL   eGFR 56 (L) >59 mL/min/1.73   BUN/Creatinine Ratio 15 10 - 24  Sodium 143 134 - 144 mmol/L   Potassium 4.0 3.5 - 5.2 mmol/L   Chloride 103 96 - 106 mmol/L   CO2 23 20 - 29 mmol/L   Calcium 9.3 8.6 - 10.2 mg/dL   Total Protein 6.2 6.0 - 8.5 g/dL   Albumin 4.4 3.8 - 4.8 g/dL   Globulin, Total 1.8 1.5 - 4.5 g/dL   Albumin/Globulin Ratio 2.4 (H) 1.2 - 2.2   Bilirubin Total 0.2 0.0 - 1.2 mg/dL   Alkaline Phosphatase 59 44 - 121 IU/L   AST 22 0 - 40 IU/L   ALT 25 0 - 44 IU/L  CBC with Differential/Platelet  Result Value Ref Range   WBC 5.4 3.4 - 10.8 x10E3/uL   RBC 4.01 (L) 4.14 - 5.80 x10E6/uL   Hemoglobin 13.4 13.0 - 17.7 g/dL   Hematocrit 38.4 37.5 - 51.0 %   MCV 96 79 - 97 fL   MCH 33.4 (H) 26.6 - 33.0 pg   MCHC 34.9 31.5 - 35.7 g/dL   RDW 11.9 11.6 - 15.4 %   Platelets 153 150 - 450 x10E3/uL   Neutrophils 66 Not Estab. %   Lymphs 15 Not Estab. %   Monocytes 15 Not Estab. %   Eos 3 Not Estab. %   Basos 1 Not Estab. %   Neutrophils Absolute 3.6 1.4 - 7.0 x10E3/uL   Lymphocytes Absolute 0.8 0.7 - 3.1 x10E3/uL   Monocytes Absolute 0.8 0.1 - 0.9 x10E3/uL   EOS (ABSOLUTE) 0.2 0.0 - 0.4 x10E3/uL   Basophils Absolute 0.0 0.0 - 0.2 x10E3/uL   Immature Granulocytes 0 Not Estab. %   Immature Grans (Abs) 0.0 0.0 - 0.1 x10E3/uL    Assessment & Plan:   Problem List Items Addressed This Visit      Genitourinary   CKD (chronic kidney disease) stage 3, GFR 30-59 ml/min (HCC)     Other   Hyperlipidemia   Relevant Medications   simvastatin (ZOCOR) 40 MG tablet   Overweight (BMI 25.0-29.9)    Other Visit Diagnoses    Well adult exam    -  Primary      Will decrease simvastatin to every other day and see how he does with that.  No other changes, continues  with chemotherapy and oncology. Follow up plan: Return in about 1 year (around 07/21/2021), or if symptoms worsen or fail to improve, for Well adult exam.  Counseling provided for all of the vaccine components No orders of the defined types were placed in this encounter.   Caryl Pina, MD [facility] 07/21/2020, 9:29 AM

## 2020-07-22 DIAGNOSIS — Z9484 Stem cells transplant status: Secondary | ICD-10-CM | POA: Diagnosis not present

## 2020-07-22 DIAGNOSIS — C8318 Mantle cell lymphoma, lymph nodes of multiple sites: Secondary | ICD-10-CM | POA: Diagnosis not present

## 2020-07-22 DIAGNOSIS — Z5111 Encounter for antineoplastic chemotherapy: Secondary | ICD-10-CM | POA: Diagnosis not present

## 2020-07-23 ENCOUNTER — Other Ambulatory Visit: Payer: Self-pay | Admitting: Family Medicine

## 2020-07-23 DIAGNOSIS — E782 Mixed hyperlipidemia: Secondary | ICD-10-CM

## 2020-08-19 DIAGNOSIS — H6123 Impacted cerumen, bilateral: Secondary | ICD-10-CM | POA: Diagnosis not present

## 2020-08-19 DIAGNOSIS — H900 Conductive hearing loss, bilateral: Secondary | ICD-10-CM | POA: Diagnosis not present

## 2020-09-16 DIAGNOSIS — Z5112 Encounter for antineoplastic immunotherapy: Secondary | ICD-10-CM | POA: Diagnosis not present

## 2020-09-16 DIAGNOSIS — C8310 Mantle cell lymphoma, unspecified site: Secondary | ICD-10-CM | POA: Diagnosis not present

## 2020-10-20 ENCOUNTER — Telehealth: Payer: Self-pay | Admitting: Family Medicine

## 2020-10-20 NOTE — Telephone Encounter (Signed)
  Prescription Request  10/20/2020  What is the name of the medication or equipment? RX for Anti-Malaria Drug  Have you contacted your pharmacy to request a refill? (if applicable) yes  Which pharmacy would you like this sent to? [facility] in [facility]   Patient notified that their request is being sent to the clinical staff for review and that they should receive a response within 2 business days.    Dettinger's pt.  Pt is going to [place] for a wedding & he needs a RX filled before next Wednesday.  Please call wife.

## 2020-10-21 ENCOUNTER — Ambulatory Visit: Payer: Medicare HMO

## 2020-10-25 ENCOUNTER — Ambulatory Visit: Payer: Medicare HMO

## 2020-11-02 ENCOUNTER — Ambulatory Visit: Payer: Medicare HMO

## 2020-11-03 ENCOUNTER — Other Ambulatory Visit: Payer: Self-pay

## 2020-11-03 ENCOUNTER — Ambulatory Visit (INDEPENDENT_AMBULATORY_CARE_PROVIDER_SITE_OTHER): Payer: Medicare HMO | Admitting: *Deleted

## 2020-11-03 DIAGNOSIS — Z23 Encounter for immunization: Secondary | ICD-10-CM

## 2020-11-11 DIAGNOSIS — Z5111 Encounter for antineoplastic chemotherapy: Secondary | ICD-10-CM | POA: Diagnosis not present

## 2020-11-11 DIAGNOSIS — C8310 Mantle cell lymphoma, unspecified site: Secondary | ICD-10-CM | POA: Diagnosis not present

## 2021-01-04 DIAGNOSIS — Z5112 Encounter for antineoplastic immunotherapy: Secondary | ICD-10-CM | POA: Diagnosis not present

## 2021-01-04 DIAGNOSIS — C8310 Mantle cell lymphoma, unspecified site: Secondary | ICD-10-CM | POA: Diagnosis not present

## 2021-01-04 DIAGNOSIS — C8318 Mantle cell lymphoma, lymph nodes of multiple sites: Secondary | ICD-10-CM | POA: Diagnosis not present

## 2021-01-04 DIAGNOSIS — Z8616 Personal history of COVID-19: Secondary | ICD-10-CM | POA: Diagnosis not present

## 2021-01-04 DIAGNOSIS — R059 Cough, unspecified: Secondary | ICD-10-CM | POA: Diagnosis not present

## 2021-01-04 DIAGNOSIS — Z9484 Stem cells transplant status: Secondary | ICD-10-CM | POA: Diagnosis not present

## 2021-01-04 DIAGNOSIS — Z006 Encounter for examination for normal comparison and control in clinical research program: Secondary | ICD-10-CM | POA: Diagnosis not present

## 2021-02-15 ENCOUNTER — Ambulatory Visit (INDEPENDENT_AMBULATORY_CARE_PROVIDER_SITE_OTHER): Payer: Medicare HMO

## 2021-02-15 VITALS — Ht 65.0 in | Wt 178.0 lb

## 2021-02-15 DIAGNOSIS — Z0000 Encounter for general adult medical examination without abnormal findings: Secondary | ICD-10-CM

## 2021-02-15 NOTE — Patient Instructions (Signed)
[name] , Thank you for taking time to come for your Medicare Wellness Visit. I appreciate your ongoing commitment to your health goals. Please review the following plan we discussed and let me know if I can assist you in the future.   Screening recommendations/referrals: Colonoscopy: Done 01/19/2019 - Repeat in 10 years Recommended yearly ophthalmology/optometry visit for glaucoma screening and checkup Recommended yearly dental visit for hygiene and checkup  Vaccinations: Influenza vaccine: Done 01/27/2020 - Repeat annually *due Pneumococcal vaccine: Done 01/27/2020 & 05/04/2020 Tdap vaccine: Done 07/31/2011 - Repeat in 10 years  Shingles vaccine: Done 05/13/2020 & 11/03/2020   Covid-19: Done 05/30/2019, 06/27/2019, 01/14/2020 & 10/04/2020  Advanced directives: Please bring a copy of your health care power of attorney and living will to the office to be added to your chart at your convenience.   Conditions/risks identified: Aim for 30 minutes of exercise or brisk walking each day, drink 6-8 glasses of water and eat lots of fruits and vegetables.   Next appointment: Follow up in one year for your annual wellness visit.   Preventive Care 69 Years and Older, Male  Preventive care refers to lifestyle choices and visits with your health care provider that can promote health and wellness. What does preventive care include? A yearly physical exam. This is also called an annual well check. Dental exams once or twice a year. Routine eye exams. Ask your health care provider how often you should have your eyes checked. Personal lifestyle choices, including: Daily care of your teeth and gums. Regular physical activity. Eating a healthy diet. Avoiding tobacco and drug use. Limiting alcohol use. Practicing safe sex. Taking low doses of aspirin every day. Taking vitamin and mineral supplements as recommended by your health care provider. What happens during an annual well check? The services and  screenings done by your health care provider during your annual well check will depend on your age, overall health, lifestyle risk factors, and family history of disease. Counseling  Your health care provider may ask you questions about your: Alcohol use. Tobacco use. Drug use. Emotional well-being. Home and relationship well-being. Sexual activity. Eating habits. History of falls. Memory and ability to understand (cognition). Work and work Statistician. Screening  You may have the following tests or measurements: Height, weight, and BMI. Blood pressure. Lipid and cholesterol levels. These may be checked every 5 years, or more frequently if you are over 50 years old. Skin check. Lung cancer screening. You may have this screening every year starting at age 64 if you have a 30-pack-year history of smoking and currently smoke or have quit within the past 15 years. Fecal occult blood test (FOBT) of the stool. You may have this test every year starting at age 64. Flexible sigmoidoscopy or colonoscopy. You may have a sigmoidoscopy every 5 years or a colonoscopy every 10 years starting at age 76. Prostate cancer screening. Recommendations will vary depending on your family history and other risks. Hepatitis C blood test. Hepatitis B blood test. Sexually transmitted disease (STD) testing. Diabetes screening. This is done by checking your blood sugar (glucose) after you have not eaten for a while (fasting). You may have this done every 1-3 years. Abdominal aortic aneurysm (AAA) screening. You may need this if you are a current or former smoker. Osteoporosis. You may be screened starting at age 59 if you are at high risk. Talk with your health care provider about your test results, treatment options, and if necessary, the need for more tests. Vaccines  Your health care provider may recommend certain vaccines, such as: Influenza vaccine. This is recommended every year. Tetanus, diphtheria, and  acellular pertussis (Tdap, Td) vaccine. You may need a Td booster every 10 years. Zoster vaccine. You may need this after age 29. Pneumococcal 13-valent conjugate (PCV13) vaccine. One dose is recommended after age 34. Pneumococcal polysaccharide (PPSV23) vaccine. One dose is recommended after age 60. Talk to your health care provider about which screenings and vaccines you need and how often you need them. This information is not intended to replace advice given to you by your health care provider. Make sure you discuss any questions you have with your health care provider. Document Released: 04/29/2015 Document Revised: 12/21/2015 Document Reviewed: 02/01/2015 Elsevier Interactive Patient Education  2017 [place] Prevention in the Home Falls can cause injuries. They can happen to people of all ages. There are many things you can do to make your home safe and to help prevent falls. What can I do on the outside of my home? Regularly fix the edges of walkways and driveways and fix any cracks. Remove anything that might make you trip as you walk through a door, such as a raised step or threshold. Trim any bushes or trees on the path to your home. Use bright outdoor lighting. Clear any walking paths of anything that might make someone trip, such as rocks or tools. Regularly check to see if handrails are loose or broken. Make sure that both sides of any steps have handrails. Any raised decks and porches should have guardrails on the edges. Have any leaves, snow, or ice cleared regularly. Use sand or salt on walking paths during winter. Clean up any spills in your garage right away. This includes oil or grease spills. What can I do in the bathroom? Use night lights. Install grab bars by the toilet and in the tub and shower. Do not use towel bars as grab bars. Use non-skid mats or decals in the tub or shower. If you need to sit down in the shower, use a plastic, non-slip stool. Keep  the floor dry. Clean up any water that spills on the floor as soon as it happens. Remove soap buildup in the tub or shower regularly. Attach bath mats securely with double-sided non-slip rug tape. Do not have throw rugs and other things on the floor that can make you trip. What can I do in the bedroom? Use night lights. Make sure that you have a light by your bed that is easy to reach. Do not use any sheets or blankets that are too big for your bed. They should not hang down onto the floor. Have a firm chair that has side arms. You can use this for support while you get dressed. Do not have throw rugs and other things on the floor that can make you trip. What can I do in the kitchen? Clean up any spills right away. Avoid walking on wet floors. Keep items that you use a lot in easy-to-reach places. If you need to reach something above you, use a strong step stool that has a grab bar. Keep electrical cords out of the way. Do not use floor polish or wax that makes floors slippery. If you must use wax, use non-skid floor wax. Do not have throw rugs and other things on the floor that can make you trip. What can I do with my stairs? Do not leave any items on the stairs. Make sure that there are  handrails on both sides of the stairs and use them. Fix handrails that are broken or loose. Make sure that handrails are as long as the stairways. Check any carpeting to make sure that it is firmly attached to the stairs. Fix any carpet that is loose or worn. Avoid having throw rugs at the top or bottom of the stairs. If you do have throw rugs, attach them to the floor with carpet tape. Make sure that you have a light switch at the top of the stairs and the bottom of the stairs. If you do not have them, ask someone to add them for you. What else can I do to help prevent falls? Wear shoes that: Do not have high heels. Have rubber bottoms. Are comfortable and fit you well. Are closed at the toe. Do not  wear sandals. If you use a stepladder: Make sure that it is fully opened. Do not climb a closed stepladder. Make sure that both sides of the stepladder are locked into place. Ask someone to hold it for you, if possible. Clearly mark and make sure that you can see: Any grab bars or handrails. First and last steps. Where the edge of each step is. Use tools that help you move around (mobility aids) if they are needed. These include: Canes. Walkers. Scooters. Crutches. Turn on the lights when you go into a dark area. Replace any light bulbs as soon as they burn out. Set up your furniture so you have a clear path. Avoid moving your furniture around. If any of your floors are uneven, fix them. If there are any pets around you, be aware of where they are. Review your medicines with your doctor. Some medicines can make you feel dizzy. This can increase your chance of falling. Ask your doctor what other things that you can do to help prevent falls. This information is not intended to replace advice given to you by your health care provider. Make sure you discuss any questions you have with your health care provider. Document Released: 01/27/2009 Document Revised: 09/08/2015 Document Reviewed: 05/07/2014 Elsevier Interactive Patient Education  2017 [employer].

## 2021-02-15 NOTE — Progress Notes (Signed)
Subjective:   Shawn Cline is a 70 y.o. male who presents for Medicare Annual/Subsequent preventive examination.  Virtual Visit via Telephone Note  I connected with  Shawn Cline on 02/15/21 at  8:15 AM EDT by telephone and verified that I am speaking with the correct person using two identifiers.  Location: Patient: Home Provider: [facility] Persons participating in the virtual visit: patient/Nurse Health Advisor   I discussed the limitations, risks, security and privacy concerns of performing an evaluation and management service by telephone and the availability of in person appointments. The patient expressed understanding and agreed to proceed.  Interactive audio and video telecommunications were attempted between this nurse and patient, however failed, due to patient having technical difficulties OR patient did not have access to video capability.  We continued and completed visit with audio only.  Some vital signs may be absent or patient reported.   Shawn Dardis E Goro Wenrick, LPN   Review of Systems     Cardiac Risk Factors include: advanced age (>81men, >33 women);male gender;dyslipidemia;Other (see comment), Risk factor comments: Mantle Cell Lymphoma     Objective:    Today's Vitals   02/15/21 0822  Weight: 178 lb (80.7 kg)  Height: 5\' 5"  (1.651 m)   Body mass index is 29.62 kg/m.  Advanced Directives 02/15/2021 06/13/2018 06/05/2018 05/23/2018  Does Patient Have a Medical Advance Directive? Yes No No No  Type of Paramedic of [place];Living will - - -  Copy of [place] in Chart? No - copy requested - - -  Would patient like information on creating a medical advance directive? - No - Patient declined No - Patient declined No - Patient declined    Current Medications (verified) Outpatient Encounter Medications as of 02/15/2021  Medication Sig   aspirin EC 81 MG tablet Take 81 mg by mouth daily.   cholecalciferol (VITAMIN D) 1000  units tablet Take 1 tablet (1,000 Units total) by mouth daily.   loratadine (CLARITIN REDITABS) 10 MG dissolvable tablet Take by mouth.   Melatonin 5 MG TABS Take by mouth.   omeprazole (PRILOSEC) 20 MG capsule Take 20 mg by mouth daily.   simvastatin (ZOCOR) 40 MG tablet Take 1 tablet (40 mg total) by mouth every other day.   No facility-administered encounter medications on file as of 02/15/2021.    Allergies (verified) Patient has no known allergies.   History: Past Medical History:  Diagnosis Date   Hypercholesteremia    Past Surgical History:  Procedure Laterality Date   HEMORRHOID SURGERY     Family History  Problem Relation Age of Onset   Atrial fibrillation Mother    Dementia Mother    Cancer Father        lung   Social History   Socioeconomic History   Marital status: Married    Spouse name: Not on file   Number of children: 1   Years of education: 16   Highest education level: Some college, no degree  Occupational History   Occupation: retired  Tobacco Use   Smoking status: Former    Types: Cigarettes    Quit date: 12/03/1991    Years since quitting: 29.2   Smokeless tobacco: Never  Vaping Use   Vaping Use: Never used  Substance and Sexual Activity   Alcohol use: No   Drug use: No   Sexual activity: Not Currently  Other Topics Concern   Not on file  Social History Narrative   Not on file  Social Determinants of Health   Financial Resource Strain: Low Risk    Difficulty of Paying Living Expenses: Not hard at all  Food Insecurity: No Food Insecurity   Worried About Charity fundraiser in the Last Year: Never true   [place] in the Last Year: Never true  Transportation Needs: No Transportation Needs   Lack of Transportation (Medical): No   Lack of Transportation (Non-Medical): No  Physical Activity: Sufficiently Active   Days of Exercise per Week: 7 days   Minutes of Exercise per Session: 30 min  Stress: No Stress Concern Present    Feeling of Stress : Not at all  Social Connections: Moderately Integrated   Frequency of Communication with Friends and Family: More than three times a week   Frequency of Social Gatherings with Friends and Family: More than three times a week   Attends Religious Services: More than 4 times per year   Active Member of [employer] or Organizations: No   Attends Music therapist: Never   Marital Status: Married    Tobacco Counseling Counseling given: Not Answered   Clinical Intake:  Pre-visit preparation completed: Yes  Pain : No/denies pain     BMI - recorded: 29.62 Nutritional Status: BMI 25 -29 Overweight Nutritional Risks: None Diabetes: No  How often do you need to have someone help you when you read instructions, pamphlets, or other written materials from your doctor or pharmacy?: 1 - Never  Diabetic? no  Interpreter Needed?: No  Information entered by :: Shawn Wirtanen, LPN   Activities of Daily Living In your present state of health, do you have any difficulty performing the following activities: 02/15/2021  Hearing? N  Vision? N  Difficulty concentrating or making decisions? N  Walking or climbing stairs? N  Dressing or bathing? N  Doing errands, shopping? N  Preparing Food and eating ? N  Using the Toilet? N  In the past six months, have you accidently leaked urine? N  Do you have problems with loss of bowel control? N  Managing your Medications? N  Managing your Finances? N  Housekeeping or managing your Housekeeping? N  Some recent data might be hidden    Patient Care Team: Dettinger, Fransisca Kaufmann, MD as PCP - General (Family Medicine)  Indicate any recent Medical Services you may have received from other than [facility] providers in the past year (date may be approximate).     Assessment:   This is a routine wellness examination for [facility].  Hearing/Vision screen Hearing Screening - Comments:: Denies hearing difficulties  Vision Screening -  Comments:: Wears rx glasses - up to date with annual eye exams with Dr Verl Blalock in [place] issues and exercise activities discussed: Current Exercise Habits: Home exercise routine, Type of exercise: walking;Other - see comments (yard work, helping others, working on his project car, Wellsite geologist, Social research officer, government), Time (Minutes): 30, Frequency (Times/Week): 7, Weekly Exercise (Minutes/Week): 210, Intensity: Mild, Exercise limited by: None identified   Goals Addressed             This Visit's Progress    Exercise 150 min/wk Moderate Activity   On track      Depression Screen PHQ 2/9 Scores 02/15/2021 07/21/2020 06/12/2018 01/27/2018 12/11/2017 06/01/2017 10/29/2016  PHQ - 2 Score 0 0 0 0 0 0 0    Fall Risk Fall Risk  02/15/2021 07/21/2020 03/29/2020 01/27/2018 12/11/2017  Falls in the past year? 0 0 0 No No  Number  falls in past yr: 0 - - - -  Injury with Fall? 0 - - - -  Risk for fall due to : No Fall Risks - - - -  Follow up Falls prevention discussed - - - -    FALL [place]:  Any stairs in or around the home? No  If so, are there any without handrails? No  Home free of loose throw rugs in walkways, pet beds, electrical cords, etc? Yes  Adequate lighting in your home to reduce risk of falls? Yes   ASSISTIVE DEVICES UTILIZED TO PREVENT FALLS:  Life alert? No  Use of a cane, walker or w/c? No  Grab bars in the bathroom? No  Shower chair or bench in shower? Yes  Elevated toilet seat or a handicapped toilet? Yes   TIMED UP AND GO:  Was the test performed? No . Telephonic visit  Cognitive Function: Normal cognitive status assessed by direct observation by this Nurse Health Advisor. No abnormalities found.    MMSE - Mini Mental State Exam 01/27/2018  Orientation to time 5  Orientation to Place 5  Registration 3  Attention/ Calculation 5  Recall 3  Language- name 2 objects 2  Language- repeat 1  Language- follow 3 step command 3  Language- read  & follow direction 1  Write a sentence 1  Copy design 1  Total score 30     6CIT Screen 02/15/2021  What Year? 0 points  What month? 0 points  What time? 0 points  Count back from 20 0 points  Months in reverse 0 points  Repeat phrase 0 points  Total Score 0    Immunizations Immunization History  Administered Date(s) Administered   DTaP / IPV 07/22/2019, 10/21/2019, 01/27/2020   Hepatitis A, Adult 07/21/2020   Hepatitis B, ped/adol 07/22/2019, 10/21/2019, 05/04/2020   HiB (PRP-OMP) 07/22/2019, 10/21/2019, 01/27/2020   HiB (PRP-T) 07/22/2019, 10/21/2019, 01/27/2020   Influenza Split 06/01/2014, 02/11/2015, 02/17/2016   Influenza, High Dose Seasonal PF 01/27/2018   Influenza,inj,Quad PF,6+ Mos 06/01/2014, 02/11/2015, 02/17/2016   Influenza-Unspecified 01/27/2020   Moderna SARS-COV2 Booster Vaccination 10/04/2020   Moderna Sars-Covid-2 Vaccination 05/30/2019, 06/27/2019, 01/14/2020   Pneumococcal Conjugate-13 07/22/2019, 10/21/2019, 01/27/2020   Pneumococcal Polysaccharide-23 05/04/2020   Td 07/31/2011   Zoster Recombinat (Shingrix) 01/27/2018, 05/13/2020, 11/03/2020    TDAP status: Up to date  Flu Vaccine status: Due, Education has been provided regarding the importance of this vaccine. Advised may receive this vaccine at local pharmacy or Health Dept. Aware to provide a copy of the vaccination record if obtained from local pharmacy or Health Dept. Verbalized acceptance and understanding.  Pneumococcal vaccine status: Up to date  Covid-19 vaccine status: Completed vaccines  Qualifies for Shingles Vaccine? Yes   Zostavax completed Yes   Shingrix Completed?: Yes  Screening Tests Health Maintenance  Topic Date Due   INFLUENZA VACCINE  11/14/2020   COVID-19 Vaccine (4 - Booster for Moderna series) 11/29/2020   TETANUS/TDAP  07/30/2021   COLONOSCOPY (Pts 45-36yrs Insurance coverage will need to be confirmed)  01/18/2029   Pneumonia Vaccine 2+ Years old  Completed    Hepatitis C Screening  Completed   Zoster Vaccines- Shingrix  Completed   HPV VACCINES  Aged Out    Health Maintenance  Health Maintenance Due  Topic Date Due   INFLUENZA VACCINE  11/14/2020   COVID-19 Vaccine (4 - Booster for Moderna series) 11/29/2020    Colorectal cancer screening: Type of screening: Colonoscopy. Completed  01/19/2019. Repeat every 10 years  Lung Cancer Screening: (Low Dose CT Chest recommended if Age 73-80 years, 30 pack-year currently smoking OR have quit w/in 15years.) does not qualify.   Additional Screening:  Hepatitis C Screening: does qualify; Completed 05/09/2018  Vision Screening: Recommended annual ophthalmology exams for early detection of glaucoma and other disorders of the eye. Is the patient up to date with their annual eye exam?  Yes  Who is the provider or what is the name of the office in which the patient attends annual eye exams? Dr Verl Blalock in [place] If pt is not established with a provider, would they like to be referred to a provider to establish care? No .   Dental Screening: Recommended annual dental exams for proper oral hygiene  Community Resource Referral / Chronic Care Management: CRR required this visit?  No   CCM required this visit?  No      Plan:     I have personally reviewed and noted the following in the patient's chart:   Medical and social history Use of alcohol, tobacco or illicit drugs  Current medications and supplements including opioid prescriptions. Patient is not currently taking opioid prescriptions. Functional ability and status Nutritional status Physical activity Advanced directives List of other physicians Hospitalizations, surgeries, and ER visits in previous 12 months Vitals Screenings to include cognitive, depression, and falls Referrals and appointments  In addition, I have reviewed and discussed with patient certain preventive protocols, quality metrics, and best practice recommendations. A  written personalized care plan for preventive services as well as general preventive health recommendations were provided to patient.     Shawn Hammond, LPN   [id number]/04/[id number]   Nurse Notes: None

## 2021-02-17 DIAGNOSIS — H6123 Impacted cerumen, bilateral: Secondary | ICD-10-CM | POA: Diagnosis not present

## 2021-02-17 DIAGNOSIS — H900 Conductive hearing loss, bilateral: Secondary | ICD-10-CM | POA: Diagnosis not present

## 2021-03-03 DIAGNOSIS — Z5111 Encounter for antineoplastic chemotherapy: Secondary | ICD-10-CM | POA: Diagnosis not present

## 2021-03-03 DIAGNOSIS — C8310 Mantle cell lymphoma, unspecified site: Secondary | ICD-10-CM | POA: Diagnosis not present

## 2021-04-28 DIAGNOSIS — Z5111 Encounter for antineoplastic chemotherapy: Secondary | ICD-10-CM | POA: Diagnosis not present

## 2021-04-28 DIAGNOSIS — C8310 Mantle cell lymphoma, unspecified site: Secondary | ICD-10-CM | POA: Diagnosis not present

## 2021-04-28 DIAGNOSIS — Z20822 Contact with and (suspected) exposure to covid-19: Secondary | ICD-10-CM | POA: Diagnosis not present

## 2021-05-24 ENCOUNTER — Encounter: Payer: Self-pay | Admitting: *Deleted

## 2021-06-21 DIAGNOSIS — C8310 Mantle cell lymphoma, unspecified site: Secondary | ICD-10-CM | POA: Diagnosis not present

## 2021-06-21 DIAGNOSIS — Z5111 Encounter for antineoplastic chemotherapy: Secondary | ICD-10-CM | POA: Diagnosis not present

## 2021-06-21 DIAGNOSIS — Z95828 Presence of other vascular implants and grafts: Secondary | ICD-10-CM | POA: Diagnosis not present

## 2021-06-21 DIAGNOSIS — Z9484 Stem cells transplant status: Secondary | ICD-10-CM | POA: Diagnosis not present

## 2021-06-21 DIAGNOSIS — Z5112 Encounter for antineoplastic immunotherapy: Secondary | ICD-10-CM | POA: Diagnosis not present

## 2021-06-23 DIAGNOSIS — C8310 Mantle cell lymphoma, unspecified site: Secondary | ICD-10-CM | POA: Diagnosis not present

## 2021-06-23 DIAGNOSIS — Z5111 Encounter for antineoplastic chemotherapy: Secondary | ICD-10-CM | POA: Diagnosis not present

## 2021-06-23 DIAGNOSIS — Z23 Encounter for immunization: Secondary | ICD-10-CM | POA: Diagnosis not present

## 2021-07-08 DIAGNOSIS — N529 Male erectile dysfunction, unspecified: Secondary | ICD-10-CM | POA: Diagnosis not present

## 2021-07-08 DIAGNOSIS — R030 Elevated blood-pressure reading, without diagnosis of hypertension: Secondary | ICD-10-CM | POA: Diagnosis not present

## 2021-07-08 DIAGNOSIS — K219 Gastro-esophageal reflux disease without esophagitis: Secondary | ICD-10-CM | POA: Diagnosis not present

## 2021-07-08 DIAGNOSIS — Z825 Family history of asthma and other chronic lower respiratory diseases: Secondary | ICD-10-CM | POA: Diagnosis not present

## 2021-07-08 DIAGNOSIS — E785 Hyperlipidemia, unspecified: Secondary | ICD-10-CM | POA: Diagnosis not present

## 2021-07-08 DIAGNOSIS — Z7982 Long term (current) use of aspirin: Secondary | ICD-10-CM | POA: Diagnosis not present

## 2021-07-08 DIAGNOSIS — E663 Overweight: Secondary | ICD-10-CM | POA: Diagnosis not present

## 2021-07-08 DIAGNOSIS — C8310 Mantle cell lymphoma, unspecified site: Secondary | ICD-10-CM | POA: Diagnosis not present

## 2021-07-08 DIAGNOSIS — Z6829 Body mass index (BMI) 29.0-29.9, adult: Secondary | ICD-10-CM | POA: Diagnosis not present

## 2021-07-08 DIAGNOSIS — R69 Illness, unspecified: Secondary | ICD-10-CM | POA: Diagnosis not present

## 2021-07-08 DIAGNOSIS — Z008 Encounter for other general examination: Secondary | ICD-10-CM | POA: Diagnosis not present

## 2021-07-08 DIAGNOSIS — Z809 Family history of malignant neoplasm, unspecified: Secondary | ICD-10-CM | POA: Diagnosis not present

## 2021-07-10 ENCOUNTER — Other Ambulatory Visit: Payer: Self-pay | Admitting: Family Medicine

## 2021-07-10 DIAGNOSIS — E782 Mixed hyperlipidemia: Secondary | ICD-10-CM

## 2021-07-12 ENCOUNTER — Telehealth: Payer: Self-pay | Admitting: Family Medicine

## 2021-07-12 DIAGNOSIS — E782 Mixed hyperlipidemia: Secondary | ICD-10-CM

## 2021-07-12 DIAGNOSIS — Z0000 Encounter for general adult medical examination without abnormal findings: Secondary | ICD-10-CM

## 2021-07-12 DIAGNOSIS — N1831 Chronic kidney disease, stage 3a: Secondary | ICD-10-CM

## 2021-07-12 DIAGNOSIS — E663 Overweight: Secondary | ICD-10-CM

## 2021-07-12 DIAGNOSIS — Z125 Encounter for screening for malignant neoplasm of prostate: Secondary | ICD-10-CM

## 2021-07-12 NOTE — Telephone Encounter (Signed)
Future Labs ordered.  ?

## 2021-07-13 ENCOUNTER — Other Ambulatory Visit: Payer: Medicare HMO

## 2021-07-13 DIAGNOSIS — Z0000 Encounter for general adult medical examination without abnormal findings: Secondary | ICD-10-CM

## 2021-07-13 DIAGNOSIS — E782 Mixed hyperlipidemia: Secondary | ICD-10-CM

## 2021-07-13 DIAGNOSIS — N1831 Chronic kidney disease, stage 3a: Secondary | ICD-10-CM

## 2021-07-13 DIAGNOSIS — E663 Overweight: Secondary | ICD-10-CM | POA: Diagnosis not present

## 2021-07-13 DIAGNOSIS — Z125 Encounter for screening for malignant neoplasm of prostate: Secondary | ICD-10-CM | POA: Diagnosis not present

## 2021-07-14 LAB — CMP14+EGFR
ALT: 27 IU/L (ref 0–44)
AST: 22 IU/L (ref 0–40)
Albumin/Globulin Ratio: 2.4 — ABNORMAL HIGH (ref 1.2–2.2)
Albumin: 4.4 g/dL (ref 3.8–4.8)
Alkaline Phosphatase: 63 IU/L (ref 44–121)
BUN/Creatinine Ratio: 12 (ref 10–24)
BUN: 17 mg/dL (ref 8–27)
Bilirubin Total: 0.2 mg/dL (ref 0.0–1.2)
CO2: 27 mmol/L (ref 20–29)
Calcium: 9.3 mg/dL (ref 8.6–10.2)
Chloride: 105 mmol/L (ref 96–106)
Creatinine, Ser: 1.4 mg/dL — ABNORMAL HIGH (ref 0.76–1.27)
Globulin, Total: 1.8 g/dL (ref 1.5–4.5)
Glucose: 114 mg/dL — ABNORMAL HIGH (ref 70–99)
Potassium: 4.3 mmol/L (ref 3.5–5.2)
Sodium: 143 mmol/L (ref 134–144)
Total Protein: 6.2 g/dL (ref 6.0–8.5)
eGFR: 54 mL/min/{1.73_m2} — ABNORMAL LOW (ref >59)

## 2021-07-14 LAB — CBC WITH DIFFERENTIAL/PLATELET
Basophils Absolute: 0 10*3/uL (ref 0.0–0.2)
Basos: 1 %
EOS (ABSOLUTE): 0.2 10*3/uL (ref 0.0–0.4)
Eos: 4 %
Hematocrit: 40.4 % (ref 37.5–51.0)
Hemoglobin: 13.9 g/dL (ref 13.0–17.7)
Immature Grans (Abs): 0 10*3/uL (ref 0.0–0.1)
Immature Granulocytes: 0 %
Lymphocytes Absolute: 0.7 10*3/uL (ref 0.7–3.1)
Lymphs: 14 %
MCH: 32.2 pg (ref 26.6–33.0)
MCHC: 34.4 g/dL (ref 31.5–35.7)
MCV: 94 fL (ref 79–97)
Monocytes Absolute: 0.8 10*3/uL (ref 0.1–0.9)
Monocytes: 15 %
Neutrophils Absolute: 3.4 10*3/uL (ref 1.4–7.0)
Neutrophils: 66 %
Platelets: 167 10*3/uL (ref 150–450)
RBC: 4.32 x10E6/uL (ref 4.14–5.80)
RDW: 11.9 % (ref 11.6–15.4)
WBC: 5.2 10*3/uL (ref 3.4–10.8)

## 2021-07-14 LAB — PSA, TOTAL AND FREE
PSA, Free Pct: 17.5 %
PSA, Free: 0.21 ng/mL
Prostate Specific Ag, Serum: 1.2 ng/mL (ref 0.0–4.0)

## 2021-07-14 LAB — LIPID PANEL
Chol/HDL Ratio: 3 ratio (ref 0.0–5.0)
Cholesterol, Total: 146 mg/dL (ref 100–199)
HDL: 48 mg/dL (ref >39)
LDL Chol Calc (NIH): 84 mg/dL (ref 0–99)
Triglycerides: 72 mg/dL (ref 0–149)
VLDL Cholesterol Cal: 14 mg/dL (ref 5–40)

## 2021-07-14 LAB — THYROID PANEL WITH TSH
Free Thyroxine Index: 1.5 (ref 1.2–4.9)
T3 Uptake Ratio: 23 % — ABNORMAL LOW (ref 24–39)
T4, Total: 6.4 ug/dL (ref 4.5–12.0)
TSH: 3.87 u[IU]/mL (ref 0.450–4.500)

## 2021-07-17 ENCOUNTER — Ambulatory Visit (INDEPENDENT_AMBULATORY_CARE_PROVIDER_SITE_OTHER): Payer: Medicare HMO | Admitting: Family Medicine

## 2021-07-17 ENCOUNTER — Encounter: Payer: Self-pay | Admitting: Family Medicine

## 2021-07-17 VITALS — BP 124/75 | HR 69 | Temp 97.1°F | Ht 65.0 in | Wt 183.8 lb

## 2021-07-17 DIAGNOSIS — Z0001 Encounter for general adult medical examination with abnormal findings: Secondary | ICD-10-CM | POA: Diagnosis not present

## 2021-07-17 DIAGNOSIS — Z0000 Encounter for general adult medical examination without abnormal findings: Secondary | ICD-10-CM

## 2021-07-17 DIAGNOSIS — N1831 Chronic kidney disease, stage 3a: Secondary | ICD-10-CM | POA: Diagnosis not present

## 2021-07-17 DIAGNOSIS — E782 Mixed hyperlipidemia: Secondary | ICD-10-CM

## 2021-07-17 MED ORDER — SIMVASTATIN 40 MG PO TABS: 40.0000 mg | ORAL_TABLET | ORAL | 3 refills | Status: DC

## 2021-07-17 NOTE — Progress Notes (Signed)
? ?BP 124/75   Pulse 69   Temp (!) 97.1 ?F (36.2 ?C) (Temporal)   Ht 5' 5" (1.651 m)   Wt 183 lb 12.8 oz (83.4 kg)   SpO2 97%   BMI 30.59 kg/m?   ? ?Subjective:  ? ?Patient ID: Shawn Cline, male    DOB: 1951-01-08, 71 y.o.   MRN: [id number] ? ?HPI: ?Shawn Cline is a 71 y.o. male presenting on 07/17/2021 for Annual Exam ? ? ?HPI ?Physical exam ?Patient denies any chest pain, shortness of breath, headaches or vision issues, abdominal complaints, diarrhea, nausea, vomiting, or joint issues.  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 ? ?Hyperlipidemia ?Patient is coming in for recheck of his hyperlipidemia. The patient is currently taking simvastatin every other day. They deny any issues with myalgias or history of liver damage from it. They deny any focal numbness or weakness or chest pain.  ? ?GERD ?Patient is currently on omeprazole.  She denies any major symptoms or abdominal pain or belching or burping. She denies any blood in her stool or lightheadedness or dizziness.  ? ?Relevant past medical, surgical, family and social history reviewed and updated as indicated. Interim medical history since our last visit reviewed. ?Allergies and medications reviewed and updated. ? ?Review of Systems  ?Constitutional:  Negative for chills and fever.  ?HENT:  Negative for ear pain and tinnitus.   ?Eyes:  Negative for pain and discharge.  ?Respiratory:  Negative for cough, shortness of breath and wheezing.   ?Cardiovascular:  Negative for chest pain, palpitations and leg swelling.  ?Gastrointestinal:  Negative for abdominal pain, blood in stool, constipation and diarrhea.  ?Genitourinary:  Negative for dysuria and hematuria.  ?Musculoskeletal:  Negative for back pain, gait problem and myalgias.  ?Skin:  Negative for rash.  ?Neurological:  Positive for numbness. Negative for dizziness, weakness and headaches.  ?Psychiatric/Behavioral:  Negative for  suicidal ideas.   ?All other systems reviewed and are negative. ? ?Per HPI unless specifically indicated above ? ? ?Allergies as of 07/17/2021   ?No Known Allergies ?  ? ?  ?Medication List  ?  ? ?  ? Accurate as of July 17, 2021 10:12 AM. If you have any questions, ask your nurse or doctor.  ?  ?  ? ?  ? ?aspirin EC 81 MG tablet ?Take 81 mg by mouth daily. ?  ?cholecalciferol 25 MCG (1000 UNIT) tablet ?Commonly known as: VITAMIN D ?Take 1 tablet (1,000 Units total) by mouth daily. ?  ?loratadine 10 MG dissolvable tablet ?Commonly known as: CLARITIN REDITABS ?Take by mouth. ?  ?melatonin 5 MG Tabs ?Take by mouth. ?  ?omeprazole 20 MG capsule ?Commonly known as: PRILOSEC ?Take 20 mg by mouth daily. ?  ?simvastatin 40 MG tablet ?Commonly known as: ZOCOR ?TAKE 1 TABLET BY MOUTH EVERY OTHER DAY ?  ? ?  ? ? ? ?Objective:  ? ?BP 124/75   Pulse 69   Temp (!) 97.1 ?F (36.2 ?C) (Temporal)   Ht 5' 5" (1.651 m)   Wt 183 lb 12.8 oz (83.4 kg)   SpO2 97%   BMI 30.59 kg/m?   ?Wt Readings from Last 3 Encounters:  ?07/17/21 183 lb 12.8 oz (83.4 kg)  ?02/15/21 178 lb (80.7 kg)  ?07/21/20 182 lb (82.6 kg)  ?  ?Physical Exam ?Vitals reviewed.  ?Constitutional:   ?   General: He is not in acute distress. ?  Appearance: He is well-developed. He is not diaphoretic.  ?HENT:  ?   Right Ear: External ear normal.  ?   Left Ear: External ear normal.  ?   Nose: Nose normal.  ?   Mouth/Throat:  ?   Pharynx: No oropharyngeal exudate.  ?Eyes:  ?   General: No scleral icterus. ?   Conjunctiva/sclera: Conjunctivae normal.  ?Neck:  ?   Thyroid: No thyromegaly.  ?Cardiovascular:  ?   Rate and Rhythm: Normal rate and regular rhythm.  ?   Heart sounds: Normal heart sounds. No murmur heard. ?Pulmonary:  ?   Effort: Pulmonary effort is normal. No respiratory distress.  ?   Breath sounds: Normal breath sounds. No wheezing.  ?Abdominal:  ?   General: Bowel sounds are normal. There is no distension.  ?   Palpations: Abdomen is soft.  ?   Tenderness:  There is no abdominal tenderness. There is no guarding or rebound.  ?Musculoskeletal:     ?   General: Normal range of motion.  ?   Cervical back: Neck supple.  ?Lymphadenopathy:  ?   Cervical: No cervical adenopathy.  ?Skin: ?   General: Skin is warm and dry.  ?   Findings: No rash.  ?Neurological:  ?   Mental Status: He is alert and oriented to person, place, and time.  ?   Coordination: Coordination normal.  ?Psychiatric:     ?   Behavior: Behavior normal.  ? ? ?Results for orders placed or performed in visit on 07/13/21  ?Thyroid Panel With TSH  ?Result Value Ref Range  ? TSH 3.870 0.450 - 4.500 uIU/mL  ? T4, Total 6.4 4.5 - 12.0 ug/dL  ? T3 Uptake Ratio 23 (L) 24 - 39 %  ? Free Thyroxine Index 1.5 1.2 - 4.9  ?PSA, total and free  ?Result Value Ref Range  ? Prostate Specific Ag, Serum 1.2 0.0 - 4.0 ng/mL  ? PSA, Free 0.21 N/A ng/mL  ? PSA, Free Pct 17.5 %  ?Lipid panel  ?Result Value Ref Range  ? Cholesterol, Total 146 100 - 199 mg/dL  ? Triglycerides 72 0 - 149 mg/dL  ? HDL 48 >39 mg/dL  ? VLDL Cholesterol Cal 14 5 - 40 mg/dL  ? LDL Chol Calc (NIH) 84 0 - 99 mg/dL  ? Chol/HDL Ratio 3.0 0.0 - 5.0 ratio  ?CMP14+EGFR  ?Result Value Ref Range  ? Glucose 114 (H) 70 - 99 mg/dL  ? BUN 17 8 - 27 mg/dL  ? Creatinine, Ser 1.40 (H) 0.76 - 1.27 mg/dL  ? eGFR 54 (L) >59 mL/min/1.73  ? BUN/Creatinine Ratio 12 10 - 24  ? Sodium 143 134 - 144 mmol/L  ? Potassium 4.3 3.5 - 5.2 mmol/L  ? Chloride 105 96 - 106 mmol/L  ? CO2 27 20 - 29 mmol/L  ? Calcium 9.3 8.6 - 10.2 mg/dL  ? Total Protein 6.2 6.0 - 8.5 g/dL  ? Albumin 4.4 3.8 - 4.8 g/dL  ? Globulin, Total 1.8 1.5 - 4.5 g/dL  ? Albumin/Globulin Ratio 2.4 (H) 1.2 - 2.2  ? Bilirubin Total 0.2 0.0 - 1.2 mg/dL  ? Alkaline Phosphatase 63 44 - 121 IU/L  ? AST 22 0 - 40 IU/L  ? ALT 27 0 - 44 IU/L  ?CBC with Differential/Platelet  ?Result Value Ref Range  ? WBC 5.2 3.4 - 10.8 x10E3/uL  ? RBC 4.32 4.14 - 5.80 x10E6/uL  ? Hemoglobin 13.9 13.0 - 17.7 g/dL  ? Hematocrit  40.4 37.5 - 51.0 %  ?  MCV 94 79 - 97 fL  ? MCH 32.2 26.6 - 33.0 pg  ? MCHC 34.4 31.5 - 35.7 g/dL  ? RDW 11.9 11.6 - 15.4 %  ? Platelets 167 150 - 450 x10E3/uL  ? Neutrophils 66 Not Estab. %  ? Lymphs 14 Not Estab. %  ? Monocytes 15 Not Estab. %  ? Eos 4 Not Estab. %  ? Basos 1 Not Estab. %  ? Neutrophils Absolute 3.4 1.4 - 7.0 x10E3/uL  ? Lymphocytes Absolute 0.7 0.7 - 3.1 x10E3/uL  ? Monocytes Absolute 0.8 0.1 - 0.9 x10E3/uL  ? EOS (ABSOLUTE) 0.2 0.0 - 0.4 x10E3/uL  ? Basophils Absolute 0.0 0.0 - 0.2 x10E3/uL  ? Immature Granulocytes 0 Not Estab. %  ? Immature Grans (Abs) 0.0 0.0 - 0.1 x10E3/uL  ? ? ?Assessment & Plan:  ? ?Problem List Items Addressed This Visit   ? ?  ? Genitourinary  ? CKD (chronic kidney disease) stage 3, GFR 30-59 ml/min (HCC)  ?  ? Other  ? Hyperlipidemia  ? ?Other Visit Diagnoses   ? ? Physical exam    -  Primary  ? ?  ?Borderline blood sugar, discussed diet.  Otherwise everything looks good. ? ? ? ?Follow up plan: ?Return in about 1 year (around 07/18/2022), or if symptoms worsen or fail to improve, for Physical exam. ? ?Counseling provided for all of the vaccine components ?No orders of the defined types were placed in this encounter. ? ? ?Caryl Pina, MD ?[place] ?07/17/2021, 10:12 AM ? ? ? ? ?

## 2021-07-17 NOTE — Addendum Note (Signed)
Addended by: Caryl Pina on: 07/17/2021 10:16 AM ? ? Modules accepted: Orders ? ?

## 2021-07-24 ENCOUNTER — Encounter: Payer: Medicare HMO | Admitting: Family Medicine

## 2021-08-03 DIAGNOSIS — C8310 Mantle cell lymphoma, unspecified site: Secondary | ICD-10-CM | POA: Diagnosis not present

## 2021-08-03 DIAGNOSIS — Z452 Encounter for adjustment and management of vascular access device: Secondary | ICD-10-CM | POA: Diagnosis not present

## 2021-08-18 DIAGNOSIS — C8310 Mantle cell lymphoma, unspecified site: Secondary | ICD-10-CM | POA: Diagnosis not present

## 2021-08-18 DIAGNOSIS — Z79899 Other long term (current) drug therapy: Secondary | ICD-10-CM | POA: Diagnosis not present

## 2021-08-18 DIAGNOSIS — Z5112 Encounter for antineoplastic immunotherapy: Secondary | ICD-10-CM | POA: Diagnosis not present

## 2021-09-01 DIAGNOSIS — H900 Conductive hearing loss, bilateral: Secondary | ICD-10-CM | POA: Diagnosis not present

## 2021-09-01 DIAGNOSIS — H6123 Impacted cerumen, bilateral: Secondary | ICD-10-CM | POA: Diagnosis not present

## 2021-09-15 DIAGNOSIS — H903 Sensorineural hearing loss, bilateral: Secondary | ICD-10-CM | POA: Diagnosis not present

## 2021-09-15 DIAGNOSIS — H9319 Tinnitus, unspecified ear: Secondary | ICD-10-CM | POA: Diagnosis not present

## 2021-10-13 DIAGNOSIS — C8310 Mantle cell lymphoma, unspecified site: Secondary | ICD-10-CM | POA: Diagnosis not present

## 2021-10-13 DIAGNOSIS — Z5111 Encounter for antineoplastic chemotherapy: Secondary | ICD-10-CM | POA: Diagnosis not present

## 2021-11-14 ENCOUNTER — Telehealth: Payer: Self-pay | Admitting: Family Medicine

## 2021-11-14 DIAGNOSIS — N1831 Chronic kidney disease, stage 3a: Secondary | ICD-10-CM

## 2021-11-14 NOTE — Telephone Encounter (Signed)
Will route to Dettinger for approval.  Last seen Dr. Warrick Parisian on 07/17/21 for CPE. He does not have a follow up scheduled at this time. Looks like pt is seen yearly.

## 2021-11-14 NOTE — Telephone Encounter (Signed)
REFERRAL REQUEST Telephone Note  Have you been seen at our office for this problem? Pt keeps up with apts to [facility] for maintenance. Pt just needs a new referral  (Advise that they may need an appointment with their PCP before a referral can be done)  Reason for Referral: [facility]  Referral discussed with patient: yes  Best contact number of patient for referral team: [phone] Has patient been seen by a specialist for this issue before: yes Murvin Donning  Patient provider preference for referral: [facility] [place]  Patient location preference for referral: [place] aware Dettinger is not here today and will address tomorrow.   Patient notified that referrals can take up to a week or longer to process. If they haven't heard anything within a week they should call back and speak with the referral department.

## 2021-11-15 NOTE — Telephone Encounter (Signed)
Placed referral for the patient. 

## 2021-11-30 DIAGNOSIS — L280 Lichen simplex chronicus: Secondary | ICD-10-CM | POA: Diagnosis not present

## 2021-11-30 DIAGNOSIS — L570 Actinic keratosis: Secondary | ICD-10-CM | POA: Diagnosis not present

## 2021-11-30 DIAGNOSIS — D485 Neoplasm of uncertain behavior of skin: Secondary | ICD-10-CM | POA: Diagnosis not present

## 2021-11-30 DIAGNOSIS — L821 Other seborrheic keratosis: Secondary | ICD-10-CM | POA: Diagnosis not present

## 2021-11-30 DIAGNOSIS — L814 Other melanin hyperpigmentation: Secondary | ICD-10-CM | POA: Diagnosis not present

## 2021-12-04 DIAGNOSIS — C8318 Mantle cell lymphoma, lymph nodes of multiple sites: Secondary | ICD-10-CM | POA: Diagnosis not present

## 2021-12-04 DIAGNOSIS — R590 Localized enlarged lymph nodes: Secondary | ICD-10-CM | POA: Diagnosis not present

## 2021-12-04 DIAGNOSIS — C8310 Mantle cell lymphoma, unspecified site: Secondary | ICD-10-CM | POA: Diagnosis not present

## 2021-12-06 DIAGNOSIS — T82598A Other mechanical complication of other cardiac and vascular devices and implants, initial encounter: Secondary | ICD-10-CM | POA: Diagnosis not present

## 2021-12-06 DIAGNOSIS — Z9484 Stem cells transplant status: Secondary | ICD-10-CM | POA: Diagnosis not present

## 2021-12-06 DIAGNOSIS — Z79620 Long term (current) use of immunosuppressive biologic: Secondary | ICD-10-CM | POA: Diagnosis not present

## 2021-12-06 DIAGNOSIS — C8310 Mantle cell lymphoma, unspecified site: Secondary | ICD-10-CM | POA: Diagnosis not present

## 2021-12-06 DIAGNOSIS — Z9221 Personal history of antineoplastic chemotherapy: Secondary | ICD-10-CM | POA: Diagnosis not present

## 2021-12-06 DIAGNOSIS — X58XXXA Exposure to other specified factors, initial encounter: Secondary | ICD-10-CM | POA: Diagnosis not present

## 2021-12-08 DIAGNOSIS — C8310 Mantle cell lymphoma, unspecified site: Secondary | ICD-10-CM | POA: Diagnosis not present

## 2021-12-08 DIAGNOSIS — Z5111 Encounter for antineoplastic chemotherapy: Secondary | ICD-10-CM | POA: Diagnosis not present

## 2021-12-19 DIAGNOSIS — N2581 Secondary hyperparathyroidism of renal origin: Secondary | ICD-10-CM | POA: Diagnosis not present

## 2021-12-19 DIAGNOSIS — N189 Chronic kidney disease, unspecified: Secondary | ICD-10-CM | POA: Diagnosis not present

## 2021-12-19 DIAGNOSIS — C8310 Mantle cell lymphoma, unspecified site: Secondary | ICD-10-CM | POA: Diagnosis not present

## 2021-12-19 DIAGNOSIS — E785 Hyperlipidemia, unspecified: Secondary | ICD-10-CM | POA: Diagnosis not present

## 2021-12-19 DIAGNOSIS — D631 Anemia in chronic kidney disease: Secondary | ICD-10-CM | POA: Diagnosis not present

## 2021-12-19 DIAGNOSIS — N183 Chronic kidney disease, stage 3 unspecified: Secondary | ICD-10-CM | POA: Diagnosis not present

## 2022-01-26 ENCOUNTER — Ambulatory Visit (INDEPENDENT_AMBULATORY_CARE_PROVIDER_SITE_OTHER): Payer: Medicare HMO

## 2022-01-26 DIAGNOSIS — Z23 Encounter for immunization: Secondary | ICD-10-CM | POA: Diagnosis not present

## 2022-02-16 ENCOUNTER — Ambulatory Visit (INDEPENDENT_AMBULATORY_CARE_PROVIDER_SITE_OTHER): Payer: Medicare HMO

## 2022-02-16 VITALS — Ht 66.0 in | Wt 174.0 lb

## 2022-02-16 DIAGNOSIS — Z0000 Encounter for general adult medical examination without abnormal findings: Secondary | ICD-10-CM | POA: Diagnosis not present

## 2022-02-16 NOTE — Progress Notes (Signed)
Subjective:   Shawn Cline is a 71 y.o. male who presents for Medicare Annual/Subsequent preventive examination.   I connected with  [name] on 02/16/22 by a audio enabled telemedicine application and verified that I am speaking with the correct person using two identifiers.  Patient Location: Home  Provider Location: Home Office  I discussed the limitations of evaluation and management by telemedicine. The patient expressed understanding and agreed to proceed.  Review of Systems     Cardiac Risk Factors include: advanced age (>65mn, >>24women);dyslipidemia;male gender     Objective:    Today's Vitals   02/16/22 0817  Weight: 174 lb (78.9 kg)  Height: '5\' 6"'$  (1.676 m)   Body mass index is 28.08 kg/m.     02/16/2022    8:20 AM 02/15/2021    8:27 AM 06/13/2018    1:54 PM 06/05/2018    7:42 AM 05/23/2018    9:48 AM  Advanced Directives  Does Patient Have a Medical Advance Directive? Yes Yes No No No  Type of [place] will     [place] of [place]? No - copy requested No - copy requested     Would patient like information on creating a medical advance directive?   No - Patient declined No - Patient declined No - Patient declined    Current Medications (verified) Outpatient Encounter Medications as of 02/16/2022  Medication Sig   aspirin EC 81 MG tablet Take 81 mg by mouth daily.   cholecalciferol (VITAMIN D) 1000 units tablet Take 1 tablet (1,000 Units total) by mouth daily.   loratadine (CLARITIN REDITABS) 10 MG dissolvable tablet Take by mouth.   omeprazole (PRILOSEC) 20 MG capsule Take 20 mg by mouth daily.   simvastatin (ZOCOR) 40 MG tablet Take 1 tablet (40 mg total) by mouth every other day.   Melatonin 5 MG TABS Take by mouth.   No facility-administered encounter medications on file as of 02/16/2022.    Allergies (verified) Patient has no known  allergies.   History: Past Medical History:  Diagnosis Date   Hypercholesteremia    Past Surgical History:  Procedure Laterality Date   HEMORRHOID SURGERY     Family History  Problem Relation Age of Onset   Atrial fibrillation Mother    Dementia Mother    Cancer Father        lung   Social History   Socioeconomic History   Marital status: Married    Spouse name: Not on file   Number of children: 1   Years of education: 142  Highest education level: Some college, no degree  Occupational History   Occupation: retired  Tobacco Use   Smoking status: Former    Types: Cigarettes    Quit date: 12/03/1991    Years since quitting: 30.2   Smokeless tobacco: Never  Vaping Use   Vaping Use: Never used  Substance and Sexual Activity   Alcohol use: No   Drug use: No   Sexual activity: Not Currently  Other Topics Concern   Not on file  Social History Narrative   Not on file   Social Determinants of Health   Financial Resource Strain: Low Risk  (02/16/2022)   Overall Financial Resource Strain (CARDIA)    Difficulty of Paying Living Expenses: Not hard at all  Food Insecurity: No Food Insecurity (02/16/2022)   Hunger Vital Sign    Worried About Running  Out of Food in the Last Year: Never true    Ran Out of Food in the Last Year: Never true  Transportation Needs: No Transportation Needs (02/16/2022)   PRAPARE - Hydrologist (Medical): No    Lack of Transportation (Non-Medical): No  Physical Activity: Insufficiently Active (02/16/2022)   Exercise Vital Sign    Days of Exercise per Week: 3 days    Minutes of Exercise per Session: 30 min  Stress: No Stress Concern Present (02/16/2022)   [place]    Feeling of Stress : Not at all  Social Connections: Moderately Integrated (02/16/2022)   Social Connection and Isolation Panel [NHANES]    Frequency of Communication with Friends and  Family: More than three times a week    Frequency of Social Gatherings with Friends and Family: More than three times a week    Attends Religious Services: More than 4 times per year    Active Member of [employer] or Organizations: No    Attends Music therapist: Never    Marital Status: Married    Tobacco Counseling Counseling given: Not Answered   Clinical Intake:  Pre-visit preparation completed: Yes  Pain : No/denies pain     Nutritional Risks: None Diabetes: No  How often do you need to have someone help you when you read instructions, pamphlets, or other written materials from your doctor or pharmacy?: 1 - Never  Diabetic?no   Interpreter Needed?: No  Information entered by :: Jadene Pierini, LPN   Activities of Daily Living    02/16/2022    8:20 AM 02/14/2022   10:06 AM  In your present state of health, do you have any difficulty performing the following activities:  Hearing? 0 0  Vision? 0 0  Difficulty concentrating or making decisions? 0 0  Walking or climbing stairs? 0 0  Dressing or bathing? 0 0  Doing errands, shopping? 0 0  Preparing Food and eating ? N N  Using the Toilet? N N  In the past six months, have you accidently leaked urine? N N  Do you have problems with loss of bowel control? N N  Managing your Medications? N N  Managing your Finances? N N  Housekeeping or managing your Housekeeping? N N    Patient Care Team: Dettinger, Fransisca Kaufmann, MD as PCP - General (Family Medicine)  Indicate any recent Medical Services you may have received from other than [facility] providers in the past year (date may be approximate).     Assessment:   This is a routine wellness examination for [facility].  Hearing/Vision screen Vision Screening - Comments:: Annual eye exams wears glasses   Dietary issues and exercise activities discussed: Current Exercise Habits: Home exercise routine, Type of exercise: walking, Time (Minutes): 30, Frequency (Times/Week): 3,  Weekly Exercise (Minutes/Week): 90, Intensity: Mild, Exercise limited by: None identified   Goals Addressed             This Visit's Progress    Exercise 150 min/wk Moderate Activity   On track      Depression Screen    02/16/2022    8:19 AM 07/17/2021    9:50 AM 02/15/2021    8:26 AM 07/21/2020    8:50 AM 06/12/2018   12:52 PM 01/27/2018    3:41 PM 12/11/2017    8:23 AM  PHQ 2/9 Scores  PHQ - 2 Score 0 0 0 0 0 0 0  PHQ- 9 Score  0         Fall Risk    02/16/2022    8:18 AM 02/14/2022   10:06 AM 07/17/2021    9:50 AM 02/15/2021    8:28 AM 07/21/2020    8:50 AM  Fall Risk   Falls in the past year? 0 0 0 0 0  Number falls in past yr: 0   0   Injury with Fall? 0   0   Risk for fall due to : No Fall Risks   No Fall Risks   Follow up Falls prevention discussed   Falls prevention discussed     FALL RISK PREVENTION PERTAINING TO THE HOME:  Any stairs in or around the home? No  If so, are there any without handrails? No  Home free of loose throw rugs in walkways, pet beds, electrical cords, etc? Yes  Adequate lighting in your home to reduce risk of falls? Yes   ASSISTIVE DEVICES UTILIZED TO PREVENT FALLS:  Life alert? No  Use of a cane, walker or w/c? No  Grab bars in the bathroom? No  Shower chair or bench in shower? Yes  Elevated toilet seat or a handicapped toilet? No       01/27/2018    4:00 PM  MMSE - Mini Mental State Exam  Orientation to time 5  Orientation to Place 5  Registration 3  Attention/ Calculation 5  Recall 3  Language- name 2 objects 2  Language- repeat 1  Language- follow 3 step command 3  Language- read & follow direction 1  Write a sentence 1  Copy design 1  Total score 30        02/16/2022    8:20 AM 02/15/2021    8:40 AM  6CIT Screen  What Year? 0 points 0 points  What month? 0 points 0 points  What time? 0 points 0 points  Count back from 20 0 points 0 points  Months in reverse 0 points 0 points  Repeat phrase 0 points 0 points   Total Score 0 points 0 points    Immunizations Immunization History  Administered Date(s) Administered   DTaP / IPV 07/22/2019, 10/21/2019, 01/27/2020   Fluad Quad(high Dose 65+) 01/26/2022   HIB (PRP-OMP) 07/22/2019, 10/21/2019, 01/27/2020   HIB (PRP-T) 07/22/2019, 10/21/2019, 01/27/2020   Hepatitis A, Adult 07/21/2020   Hepatitis B, PED/ADOLESCENT 07/22/2019, 10/21/2019, 05/04/2020   Influenza Split 06/01/2014, 02/11/2015, 02/17/2016   Influenza, High Dose Seasonal PF 01/27/2018, 06/23/2021   Influenza,inj,Quad PF,6+ Mos 06/01/2014, 02/11/2015, 02/17/2016   Influenza-Unspecified 01/27/2020   Moderna SARS-COV2 Booster Vaccination 10/04/2020   Moderna Sars-Covid-2 Vaccination 05/30/2019, 06/27/2019, 01/14/2020   Pneumococcal Conjugate-13 07/22/2019, 10/21/2019, 01/27/2020   Pneumococcal Polysaccharide-23 05/04/2020   Td 07/31/2011   Zoster Recombinat (Shingrix) 01/27/2018, 05/13/2020, 11/03/2020   Zoster, Unspecified 01/27/2018    TDAP status: Up to date  Flu Vaccine status: Up to date  Pneumococcal vaccine status: Up to date  Covid-19 vaccine status: Completed vaccines  Qualifies for Shingles Vaccine? Yes   Zostavax completed Yes   Shingrix Completed?: Yes  Screening Tests Health Maintenance  Topic Date Due   COVID-19 Vaccine (4 - Moderna risk series) 11/29/2020   TETANUS/TDAP  07/30/2021   Medicare Annual Wellness (AWV)  02/17/2023   COLONOSCOPY (Pts 45-46yr Insurance coverage will need to be confirmed)  01/18/2029   Pneumonia Vaccine 70 Years old  Completed   INFLUENZA VACCINE  Completed   Hepatitis C Screening  Completed  Zoster Vaccines- Shingrix  Completed   HPV VACCINES  Aged Out    Health Maintenance  Health Maintenance Due  Topic Date Due   COVID-19 Vaccine (4 - Moderna risk series) 11/29/2020   TETANUS/TDAP  07/30/2021    Colorectal cancer screening: Type of screening: Colonoscopy. Completed 01/19/2019. Repeat every 10 years  Lung Cancer  Screening: (Low Dose CT Chest recommended if Age 54-80 years, 30 pack-year currently smoking OR have quit w/in 15years.) does not qualify.   Lung Cancer Screening Referral: n/a  Additional Screening:  Hepatitis C Screening: does not qualify;   Vision Screening: Recommended annual ophthalmology exams for early detection of glaucoma and other disorders of the eye. Is the patient up to date with their annual eye exam?  Yes  Who is the provider or what is the name of the office in which the patient attends annual eye exams? Dr.Wall  If pt is not established with a provider, would they like to be referred to a provider to establish care? Yes .   Dental Screening: Recommended annual dental exams for proper oral hygiene  Community Resource Referral / Chronic Care Management: CRR required this visit?  No   CCM required this visit?  No      Plan:     I have personally reviewed and noted the following in the patient's chart:   Medical and social history Use of alcohol, tobacco or illicit drugs  Current medications and supplements including opioid prescriptions. Patient is not currently taking opioid prescriptions. Functional ability and status Nutritional status Physical activity Advanced directives List of other physicians Hospitalizations, surgeries, and ER visits in previous 12 months Vitals Screenings to include cognitive, depression, and falls Referrals and appointments  In addition, I have reviewed and discussed with patient certain preventive protocols, quality metrics, and best practice recommendations. A written personalized care plan for preventive services as well as general preventive health recommendations were provided to patient.     Daphane Shepherd, LPN   [id number]   Nurse Notes: none

## 2022-02-16 NOTE — Patient Instructions (Signed)
[name] , Thank you for taking time to come for your Medicare Wellness Visit. I appreciate your ongoing commitment to your health goals. Please review the following plan we discussed and let me know if I can assist you in the future.   These are the goals we discussed:  Goals      Exercise 150 min/wk Moderate Activity        This is a list of the screening recommended for you and due dates:  Health Maintenance  Topic Date Due   COVID-19 Vaccine (4 - Moderna risk series) 11/29/2020   Tetanus Vaccine  07/30/2021   Medicare Annual Wellness Visit  02/17/2023   Colon Cancer Screening  01/18/2029   Pneumonia Vaccine  Completed   Flu Shot  Completed   Hepatitis C Screening: USPSTF Recommendation to screen - Ages 18-79 yo.  Completed   Zoster (Shingles) Vaccine  Completed   HPV Vaccine  Aged Out    Advanced directives: Please bring a copy of your health care power of attorney and living will to the office to be added to your chart at your convenience.   Conditions/risks identified: Aim for 30 minutes of exercise or brisk walking, 6-8 glasses of water, and 5 servings of fruits and vegetables each day.   Next appointment: Follow up in one year for your annual wellness visit.   Preventive Care 70 Years and Older, Male  Preventive care refers to lifestyle choices and visits with your health care provider that can promote health and wellness. What does preventive care include? A yearly physical exam. This is also called an annual well check. Dental exams once or twice a year. Routine eye exams. Ask your health care provider how often you should have your eyes checked. Personal lifestyle choices, including: Daily care of your teeth and gums. Regular physical activity. Eating a healthy diet. Avoiding tobacco and drug use. Limiting alcohol use. Practicing safe sex. Taking low doses of aspirin every day. Taking vitamin and mineral supplements as recommended by your health care  provider. What happens during an annual well check? The services and screenings done by your health care provider during your annual well check will depend on your age, overall health, lifestyle risk factors, and family history of disease. Counseling  Your health care provider may ask you questions about your: Alcohol use. Tobacco use. Drug use. Emotional well-being. Home and relationship well-being. Sexual activity. Eating habits. History of falls. Memory and ability to understand (cognition). Work and work Statistician. Screening  You may have the following tests or measurements: Height, weight, and BMI. Blood pressure. Lipid and cholesterol levels. These may be checked every 5 years, or more frequently if you are over 46 years old. Skin check. Lung cancer screening. You may have this screening every year starting at age 28 if you have a 30-pack-year history of smoking and currently smoke or have quit within the past 15 years. Fecal occult blood test (FOBT) of the stool. You may have this test every year starting at age 58. Flexible sigmoidoscopy or colonoscopy. You may have a sigmoidoscopy every 5 years or a colonoscopy every 10 years starting at age 76. Prostate cancer screening. Recommendations will vary depending on your family history and other risks. Hepatitis C blood test. Hepatitis B blood test. Sexually transmitted disease (STD) testing. Diabetes screening. This is done by checking your blood sugar (glucose) after you have not eaten for a while (fasting). You may have this done every 1-3 years. Abdominal aortic aneurysm (  AAA) screening. You may need this if you are a current or former smoker. Osteoporosis. You may be screened starting at age 39 if you are at high risk. Talk with your health care provider about your test results, treatment options, and if necessary, the need for more tests. Vaccines  Your health care provider may recommend certain vaccines, such  as: Influenza vaccine. This is recommended every year. Tetanus, diphtheria, and acellular pertussis (Tdap, Td) vaccine. You may need a Td booster every 10 years. Zoster vaccine. You may need this after age 5. Pneumococcal 13-valent conjugate (PCV13) vaccine. One dose is recommended after age 4. Pneumococcal polysaccharide (PPSV23) vaccine. One dose is recommended after age 19. Talk to your health care provider about which screenings and vaccines you need and how often you need them. This information is not intended to replace advice given to you by your health care provider. Make sure you discuss any questions you have with your health care provider. Document Released: 04/29/2015 Document Revised: 12/21/2015 Document Reviewed: 02/01/2015 Elsevier Interactive Patient Education  2017 [place] Prevention in the Home Falls can cause injuries. They can happen to people of all ages. There are many things you can do to make your home safe and to help prevent falls. What can I do on the outside of my home? Regularly fix the edges of walkways and driveways and fix any cracks. Remove anything that might make you trip as you walk through a door, such as a raised step or threshold. Trim any bushes or trees on the path to your home. Use bright outdoor lighting. Clear any walking paths of anything that might make someone trip, such as rocks or tools. Regularly check to see if handrails are loose or broken. Make sure that both sides of any steps have handrails. Any raised decks and porches should have guardrails on the edges. Have any leaves, snow, or ice cleared regularly. Use sand or salt on walking paths during winter. Clean up any spills in your garage right away. This includes oil or grease spills. What can I do in the bathroom? Use night lights. Install grab bars by the toilet and in the tub and shower. Do not use towel bars as grab bars. Use non-skid mats or decals in the tub or  shower. If you need to sit down in the shower, use a plastic, non-slip stool. Keep the floor dry. Clean up any water that spills on the floor as soon as it happens. Remove soap buildup in the tub or shower regularly. Attach bath mats securely with double-sided non-slip rug tape. Do not have throw rugs and other things on the floor that can make you trip. What can I do in the bedroom? Use night lights. Make sure that you have a light by your bed that is easy to reach. Do not use any sheets or blankets that are too big for your bed. They should not hang down onto the floor. Have a firm chair that has side arms. You can use this for support while you get dressed. Do not have throw rugs and other things on the floor that can make you trip. What can I do in the kitchen? Clean up any spills right away. Avoid walking on wet floors. Keep items that you use a lot in easy-to-reach places. If you need to reach something above you, use a strong step stool that has a grab bar. Keep electrical cords out of the way. Do not use floor polish  or wax that makes floors slippery. If you must use wax, use non-skid floor wax. Do not have throw rugs and other things on the floor that can make you trip. What can I do with my stairs? Do not leave any items on the stairs. Make sure that there are handrails on both sides of the stairs and use them. Fix handrails that are broken or loose. Make sure that handrails are as long as the stairways. Check any carpeting to make sure that it is firmly attached to the stairs. Fix any carpet that is loose or worn. Avoid having throw rugs at the top or bottom of the stairs. If you do have throw rugs, attach them to the floor with carpet tape. Make sure that you have a light switch at the top of the stairs and the bottom of the stairs. If you do not have them, ask someone to add them for you. What else can I do to help prevent falls? Wear shoes that: Do not have high heels. Have  rubber bottoms. Are comfortable and fit you well. Are closed at the toe. Do not wear sandals. If you use a stepladder: Make sure that it is fully opened. Do not climb a closed stepladder. Make sure that both sides of the stepladder are locked into place. Ask someone to hold it for you, if possible. Clearly mark and make sure that you can see: Any grab bars or handrails. First and last steps. Where the edge of each step is. Use tools that help you move around (mobility aids) if they are needed. These include: Canes. Walkers. Scooters. Crutches. Turn on the lights when you go into a dark area. Replace any light bulbs as soon as they burn out. Set up your furniture so you have a clear path. Avoid moving your furniture around. If any of your floors are uneven, fix them. If there are any pets around you, be aware of where they are. Review your medicines with your doctor. Some medicines can make you feel dizzy. This can increase your chance of falling. Ask your doctor what other things that you can do to help prevent falls. This information is not intended to replace advice given to you by your health care provider. Make sure you discuss any questions you have with your health care provider. Document Released: 01/27/2009 Document Revised: 09/08/2015 Document Reviewed: 05/07/2014 Elsevier Interactive Patient Education  2017 [employer].

## 2022-02-23 ENCOUNTER — Ambulatory Visit (INDEPENDENT_AMBULATORY_CARE_PROVIDER_SITE_OTHER): Payer: Medicare HMO | Admitting: Family Medicine

## 2022-02-23 ENCOUNTER — Encounter: Payer: Self-pay | Admitting: Family Medicine

## 2022-02-23 VITALS — BP 131/81 | HR 61 | Temp 97.8°F | Ht 66.0 in | Wt 173.0 lb

## 2022-02-23 DIAGNOSIS — R5381 Other malaise: Secondary | ICD-10-CM | POA: Diagnosis not present

## 2022-02-23 DIAGNOSIS — R5383 Other fatigue: Secondary | ICD-10-CM

## 2022-02-23 DIAGNOSIS — U099 Post covid-19 condition, unspecified: Secondary | ICD-10-CM

## 2022-02-23 DIAGNOSIS — H903 Sensorineural hearing loss, bilateral: Secondary | ICD-10-CM | POA: Insufficient documentation

## 2022-02-23 DIAGNOSIS — R059 Cough, unspecified: Secondary | ICD-10-CM

## 2022-02-23 MED ORDER — PREDNISONE 20 MG PO TABS: 40.0000 mg | ORAL_TABLET | Freq: Every day | ORAL | 0 refills | Status: AC

## 2022-02-23 MED ORDER — HYDROCODONE BIT-HOMATROP MBR 5-1.5 MG/5ML PO SOLN: 5.0000 mL | Freq: Four times a day (QID) | ORAL | 0 refills | Status: DC | PRN

## 2022-02-23 NOTE — Progress Notes (Signed)
Subjective:  Patient ID: Shawn Cline, male    DOB: February 10, 1951, 71 y.o.   MRN: [id number]  Patient Care Team: Dettinger, Fransisca Kaufmann, MD as PCP - General (Family Medicine)   Chief Complaint:  Cough (X 3 weeks. Tested positive for COVID x 8 days ago ) and Weakness (X 8 days )   HPI: Shawn Cline is a 71 y.o. male presenting on 02/23/2022 for Cough (X 3 weeks. Tested positive for COVID x 8 days ago ) and Weakness (X 8 days )   Cough This is a new problem. Episode onset: 3 weeks ago after having COVID. Was treated wtih antivirals and completed those. Still has cough wtih general malaise and fatigue. The problem has been waxing and waning. The problem occurs every few minutes. The cough is Non-productive. Pertinent negatives include no chest pain, chills, ear congestion, ear pain, fever, headaches, heartburn, hemoptysis, myalgias, nasal congestion, postnasal drip, rash, rhinorrhea, sore throat, shortness of breath, sweats, weight loss or wheezing. Nothing aggravates the symptoms. He has tried OTC cough suppressant for the symptoms. The treatment provided no relief.      Relevant past medical, surgical, family, and social history reviewed and updated as indicated.  Allergies and medications reviewed and updated. Data reviewed: Chart in Epic.   Past Medical History:  Diagnosis Date   Hypercholesteremia     Past Surgical History:  Procedure Laterality Date   HEMORRHOID SURGERY      Social History   Socioeconomic History   Marital status: Married    Spouse name: Not on file   Number of children: 1   Years of education: 45   Highest education level: Some college, no degree  Occupational History   Occupation: retired  Tobacco Use   Smoking status: Former    Types: Cigarettes    Quit date: 12/03/1991    Years since quitting: 30.2   Smokeless tobacco: Never  Vaping Use   Vaping Use: Never used  Substance and Sexual Activity   Alcohol use: No   Drug use: No   Sexual  activity: Not Currently  Other Topics Concern   Not on file  Social History Narrative   Not on file   Social Determinants of Health   Financial Resource Strain: Low Risk  (02/16/2022)   Overall Financial Resource Strain (CARDIA)    Difficulty of Paying Living Expenses: Not hard at all  Food Insecurity: No Food Insecurity (02/16/2022)   Hunger Vital Sign    Worried About Running Out of Food in the Last Year: Never true    [place] in the Last Year: Never true  Transportation Needs: No Transportation Needs (02/16/2022)   PRAPARE - Hydrologist (Medical): No    Lack of Transportation (Non-Medical): No  Physical Activity: Insufficiently Active (02/16/2022)   Exercise Vital Sign    Days of Exercise per Week: 3 days    Minutes of Exercise per Session: 30 min  Stress: No Stress Concern Present (02/16/2022)   [place]    Feeling of Stress : Not at all  Social Connections: Moderately Integrated (02/16/2022)   Social Connection and Isolation Panel [NHANES]    Frequency of Communication with Friends and Family: More than three times a week    Frequency of Social Gatherings with Friends and Family: More than three times a week    Attends Religious Services: More than 4 times per year  Active Member of Clubs or Organizations: No    Attends Archivist Meetings: Never    Marital Status: Married  Human resources officer Violence: Not At Risk (02/16/2022)   Humiliation, Afraid, Rape, and Kick questionnaire    Fear of Current or Ex-Partner: No    Emotionally Abused: No    Physically Abused: No    Sexually Abused: No    Outpatient Encounter Medications as of 02/23/2022  Medication Sig   aspirin EC 81 MG tablet Take 81 mg by mouth daily.   cholecalciferol (VITAMIN D) 1000 units tablet Take 1 tablet (1,000 Units total) by mouth daily.   HYDROcodone bit-homatropine (HYCODAN) 5-1.5 MG/5ML syrup  Take 5 mLs by mouth every 6 (six) hours as needed for cough.   loratadine (CLARITIN REDITABS) 10 MG dissolvable tablet Take by mouth.   omeprazole (PRILOSEC) 20 MG capsule Take 20 mg by mouth daily.   predniSONE (DELTASONE) 20 MG tablet Take 2 tablets (40 mg total) by mouth daily with breakfast for 5 days.   simvastatin (ZOCOR) 40 MG tablet Take 1 tablet (40 mg total) by mouth every other day.   Melatonin 5 MG TABS Take by mouth. (Patient not taking: Reported on 02/23/2022)   No facility-administered encounter medications on file as of 02/23/2022.    No Known Allergies  Review of Systems  Constitutional:  Positive for activity change, appetite change and fatigue. Negative for chills, diaphoresis, fever, unexpected weight change and weight loss.  HENT:  Negative for congestion, dental problem, drooling, ear discharge, ear pain, facial swelling, hearing loss, mouth sores, nosebleeds, postnasal drip, rhinorrhea, sinus pressure, sinus pain, sneezing, sore throat, tinnitus, trouble swallowing and voice change.   Respiratory:  Positive for cough. Negative for apnea, hemoptysis, choking, chest tightness, shortness of breath, wheezing and stridor.   Cardiovascular:  Negative for chest pain, palpitations and leg swelling.  Gastrointestinal:  Negative for abdominal pain, constipation, diarrhea, heartburn, nausea and vomiting.  Genitourinary:  Negative for decreased urine volume and difficulty urinating.  Musculoskeletal:  Negative for arthralgias and myalgias.  Skin:  Negative for rash.  Neurological:  Negative for weakness and headaches.  Psychiatric/Behavioral:  Negative for confusion.   All other systems reviewed and are negative.       Objective:  BP 131/81   Pulse 61   Temp 97.8 F (36.6 C) (Temporal)   Ht _0  (1.676 m)   Wt 173 lb (78.5 kg)   SpO2 95%   BMI 27.92 kg/m    Wt Readings from Last 3 Encounters:  02/23/22 173 lb (78.5 kg)  02/16/22 174 lb (78.9 kg)  07/17/21 183 lb  12.8 oz (83.4 kg)    Physical Exam Vitals and nursing note reviewed.  Constitutional:      General: He is not in acute distress.    Appearance: Normal appearance. He is not ill-appearing, toxic-appearing or diaphoretic.  HENT:     Head: Normocephalic and atraumatic.     Nose: Nose normal.     Mouth/Throat:     Mouth: Mucous membranes are moist.     Pharynx: Oropharynx is clear.  Eyes:     Conjunctiva/sclera: Conjunctivae normal.     Pupils: Pupils are equal, round, and reactive to light.  Cardiovascular:     Rate and Rhythm: Normal rate and regular rhythm.     Heart sounds: Normal heart sounds.  Pulmonary:     Effort: Pulmonary effort is normal.     Breath sounds: Normal breath sounds. No wheezing, rhonchi or  rales.  Skin:    General: Skin is warm and dry.     Capillary Refill: Capillary refill takes less than 2 seconds.  Neurological:     General: No focal deficit present.     Mental Status: He is alert and oriented to person, place, and time.  Psychiatric:        Mood and Affect: Mood normal.        Behavior: Behavior normal.        Thought Content: Thought content normal.        Judgment: Judgment normal.     Results for orders placed or performed in visit on 07/13/21  Thyroid Panel With TSH  Result Value Ref Range   TSH 3.870 0.450 - 4.500 uIU/mL   T4, Total 6.4 4.5 - 12.0 ug/dL   T3 Uptake Ratio 23 (L) 24 - 39 %   Free Thyroxine Index 1.5 1.2 - 4.9  PSA, total and free  Result Value Ref Range   Prostate Specific Ag, Serum 1.2 0.0 - 4.0 ng/mL   PSA, Free 0.21 N/A ng/mL   PSA, Free Pct 17.5 %  Lipid panel  Result Value Ref Range   Cholesterol, Total 146 100 - 199 mg/dL   Triglycerides 72 0 - 149 mg/dL   HDL 48 >39 mg/dL   VLDL Cholesterol Cal 14 5 - 40 mg/dL   LDL Chol Calc (NIH) 84 0 - 99 mg/dL   Chol/HDL Ratio 3.0 0.0 - 5.0 ratio  CMP14+EGFR  Result Value Ref Range   Glucose 114 (H) 70 - 99 mg/dL   BUN 17 8 - 27 mg/dL   Creatinine, Ser 1.40 (H) 0.76 -  1.27 mg/dL   eGFR 54 (L) >59 mL/min/1.73   BUN/Creatinine Ratio 12 10 - 24   Sodium 143 134 - 144 mmol/L   Potassium 4.3 3.5 - 5.2 mmol/L   Chloride 105 96 - 106 mmol/L   CO2 27 20 - 29 mmol/L   Calcium 9.3 8.6 - 10.2 mg/dL   Total Protein 6.2 6.0 - 8.5 g/dL   Albumin 4.4 3.8 - 4.8 g/dL   Globulin, Total 1.8 1.5 - 4.5 g/dL   Albumin/Globulin Ratio 2.4 (H) 1.2 - 2.2   Bilirubin Total 0.2 0.0 - 1.2 mg/dL   Alkaline Phosphatase 63 44 - 121 IU/L   AST 22 0 - 40 IU/L   ALT 27 0 - 44 IU/L  CBC with Differential/Platelet  Result Value Ref Range   WBC 5.2 3.4 - 10.8 x10E3/uL   RBC 4.32 4.14 - 5.80 x10E6/uL   Hemoglobin 13.9 13.0 - 17.7 g/dL   Hematocrit 40.4 37.5 - 51.0 %   MCV 94 79 - 97 fL   MCH 32.2 26.6 - 33.0 pg   MCHC 34.4 31.5 - 35.7 g/dL   RDW 11.9 11.6 - 15.4 %   Platelets 167 150 - 450 x10E3/uL   Neutrophils 66 Not Estab. %   Lymphs 14 Not Estab. %   Monocytes 15 Not Estab. %   Eos 4 Not Estab. %   Basos 1 Not Estab. %   Neutrophils Absolute 3.4 1.4 - 7.0 x10E3/uL   Lymphocytes Absolute 0.7 0.7 - 3.1 x10E3/uL   Monocytes Absolute 0.8 0.1 - 0.9 x10E3/uL   EOS (ABSOLUTE) 0.2 0.0 - 0.4 x10E3/uL   Basophils Absolute 0.0 0.0 - 0.2 x10E3/uL   Immature Granulocytes 0 Not Estab. %   Immature Grans (Abs) 0.0 0.0 - 0.1 x10E3/uL       Pertinent  labs & imaging results that were available during my care of the patient were reviewed by me and considered in my medical decision making.  Assessment & Plan:  K[place] was seen today for cough and weakness.  Diagnoses and all orders for this visit:  Multiple persistent symptoms after COVID-19 Cough in adult Malaise and fatigue Long COVID symptoms. No indications of acute bacterial illness. Will burst with steroids and provide with short course of hycodan for cough relief as over the counter medications have not been successful. Pt aware to report new, worsening, or persistent symptoms.  -     predniSONE (DELTASONE) 20 MG tablet; Take  2 tablets (40 mg total) by mouth daily with breakfast for 5 days. -     HYDROcodone bit-homatropine (HYCODAN) 5-1.5 MG/5ML syrup; Take 5 mLs by mouth every 6 (six) hours as needed for cough.     Continue all other maintenance medications.  Follow up plan: Return if symptoms worsen or fail to improve.   Continue healthy lifestyle choices, including diet (rich in fruits, vegetables, and lean proteins, and low in salt and simple carbohydrates) and exercise (at least 30 minutes of moderate physical activity daily).  Educational handout given for cough   The above assessment and management plan was discussed with the patient. The patient verbalized understanding of and has agreed to the management plan. Patient is aware to call the clinic if they develop any new symptoms or if symptoms persist or worsen. Patient is aware when to return to the clinic for a follow-up visit. Patient educated on when it is appropriate to go to the emergency department.   Monia Pouch, FNP-C [facility] [phone]

## 2022-02-26 NOTE — Progress Notes (Signed)
Subjective:   Shawn Cline is a 71 y.o. male who presents for Medicare Annual/Subsequent preventive examination.   I connected with  Shawn Cline on 02/26/22 by a audio enabled telemedicine application and verified that I am speaking with the correct person using two identifiers.  Patient Location: Home  Provider Location: Home Office  I discussed the limitations of evaluation and management by telemedicine. The patient expressed understanding and agreed to proceed.  Review of Systems     Cardiac Risk Factors include: advanced age (>7mn, >>56women);dyslipidemia;male gender     Objective:    Today's Vitals   02/16/22 0817  Weight: 174 lb (78.9 kg)  Height: '5\' 6"'$  (1.676 m)   Body mass index is 28.08 kg/m.     02/16/2022    8:20 AM 02/15/2021    8:27 AM 06/13/2018    1:54 PM 06/05/2018    7:42 AM 05/23/2018    9:48 AM  Advanced Directives  Does Patient Have a Medical Advance Directive? Yes Yes No No No  Type of AParamedicof AG[place]wayLiving will [place] will     Copy of [place] Chart? No - copy requested No - copy requested     Would patient like information on creating a medical advance directive?   No - Patient declined No - Patient declined No - Patient declined    Current Medications (verified) Outpatient Encounter Medications as of 02/16/2022  Medication Sig   aspirin EC 81 MG tablet Take 81 mg by mouth daily.   cholecalciferol (VITAMIN D) 1000 units tablet Take 1 tablet (1,000 Units total) by mouth daily.   loratadine (CLARITIN REDITABS) 10 MG dissolvable tablet Take by mouth.   omeprazole (PRILOSEC) 20 MG capsule Take 20 mg by mouth daily.   simvastatin (ZOCOR) 40 MG tablet Take 1 tablet (40 mg total) by mouth every other day.   Melatonin 5 MG TABS Take by mouth. (Patient not taking: Reported on 02/23/2022)   No facility-administered encounter medications on file as of 02/16/2022.     Allergies (verified) Patient has no known allergies.   History: Past Medical History:  Diagnosis Date   Hypercholesteremia    Past Surgical History:  Procedure Laterality Date   HEMORRHOID SURGERY     Family History  Problem Relation Age of Onset   Atrial fibrillation Mother    Dementia Mother    Cancer Father        lung   Social History   Socioeconomic History   Marital status: Married    Spouse name: Not on file   Number of children: 1   Years of education: 161  Highest education level: Some college, no degree  Occupational History   Occupation: retired  Tobacco Use   Smoking status: Former    Types: Cigarettes    Quit date: 12/03/1991    Years since quitting: 30.2   Smokeless tobacco: Never  Vaping Use   Vaping Use: Never used  Substance and Sexual Activity   Alcohol use: No   Drug use: No   Sexual activity: Not Currently  Other Topics Concern   Not on file  Social History Narrative   Not on file   Social Determinants of Health   Financial Resource Strain: Low Risk  (02/16/2022)   Overall Financial Resource Strain (CARDIA)    Difficulty of Paying Living Expenses: Not hard at all  Food Insecurity: No Food Insecurity (02/16/2022)   Hunger Vital Sign  Worried About Charity fundraiser in the Last Year: Never true    [place] in the Last Year: Never true  Transportation Needs: No Transportation Needs (02/16/2022)   PRAPARE - Hydrologist (Medical): No    Lack of Transportation (Non-Medical): No  Physical Activity: Insufficiently Active (02/16/2022)   Exercise Vital Sign    Days of Exercise per Week: 3 days    Minutes of Exercise per Session: 30 min  Stress: No Stress Concern Present (02/16/2022)   [place]    Feeling of Stress : Not at all  Social Connections: Moderately Integrated (02/16/2022)   Social Connection and Isolation Panel [NHANES]     Frequency of Communication with Friends and Family: More than three times a week    Frequency of Social Gatherings with Friends and Family: More than three times a week    Attends Religious Services: More than 4 times per year    Active Member of [employer] or Organizations: No    Attends Music therapist: Never    Marital Status: Married    Tobacco Counseling Counseling given: Not Answered   Clinical Intake:  Pre-visit preparation completed: Yes  Pain : No/denies pain     Nutritional Risks: None Diabetes: No  How often do you need to have someone help you when you read instructions, pamphlets, or other written materials from your doctor or pharmacy?: 1 - Never  Diabetic?no   Interpreter Needed?: No  Information entered by :: Jadene Pierini, LPN   Activities of Daily Living    02/16/2022    8:20 AM 02/14/2022   10:06 AM  In your present state of health, do you have any difficulty performing the following activities:  Hearing? 0 0  Vision? 0 0  Difficulty concentrating or making decisions? 0 0  Walking or climbing stairs? 0 0  Dressing or bathing? 0 0  Doing errands, shopping? 0 0  Preparing Food and eating ? N N  Using the Toilet? N N  In the past six months, have you accidently leaked urine? N N  Do you have problems with loss of bowel control? N N  Managing your Medications? N N  Managing your Finances? N N  Housekeeping or managing your Housekeeping? N N    Patient Care Team: Dettinger, Fransisca Kaufmann, MD as PCP - General (Family Medicine)  Indicate any recent Medical Services you may have received from other than [facility] providers in the past year (date may be approximate).     Assessment:   This is a routine wellness examination for [facility].  Hearing/Vision screen Vision Screening - Comments:: Annual eye exams wears glasses   Dietary issues and exercise activities discussed: Current Exercise Habits: Home exercise routine, Type of exercise: walking,  Time (Minutes): 30, Frequency (Times/Week): 3, Weekly Exercise (Minutes/Week): 90, Intensity: Mild, Exercise limited by: None identified   Goals Addressed             This Visit's Progress    Exercise 150 min/wk Moderate Activity   On track     Depression Screen    02/23/2022    7:58 AM 02/16/2022    8:19 AM 07/17/2021    9:50 AM 02/15/2021    8:26 AM 07/21/2020    8:50 AM 06/12/2018   12:52 PM 01/27/2018    3:41 PM  PHQ 2/9 Scores  PHQ - 2 Score 0 0 0 0 0 0  0  PHQ- 9 Score 2  0        Fall Risk    02/23/2022    7:58 AM 02/16/2022    8:18 AM 02/14/2022   10:06 AM 07/17/2021    9:50 AM 02/15/2021    8:28 AM  Fall Risk   Falls in the past year? 0 0 0 0 0  Number falls in past yr:  0   0  Injury with Fall?  0   0  Risk for fall due to :  No Fall Risks   No Fall Risks  Follow up  Falls prevention discussed   Falls prevention discussed    FALL RISK PREVENTION PERTAINING TO THE HOME:  Any stairs in or around the home? No  If so, are there any without handrails? No  Home free of loose throw rugs in walkways, pet beds, electrical cords, etc? Yes  Adequate lighting in your home to reduce risk of falls? Yes   ASSISTIVE DEVICES UTILIZED TO PREVENT FALLS:  Life alert? No  Use of a cane, walker or w/c? No  Grab bars in the bathroom? No  Shower chair or bench in shower? Yes  Elevated toilet seat or a handicapped toilet? No       01/27/2018    4:00 PM  MMSE - Mini Mental State Exam  Orientation to time 5  Orientation to Place 5  Registration 3  Attention/ Calculation 5  Recall 3  Language- name 2 objects 2  Language- repeat 1  Language- follow 3 step command 3  Language- read & follow direction 1  Write a sentence 1  Copy design 1  Total score 30        02/16/2022    8:20 AM 02/15/2021    8:40 AM  6CIT Screen  What Year? 0 points 0 points  What month? 0 points 0 points  What time? 0 points 0 points  Count back from 20 0 points 0 points  Months in reverse 0  points 0 points  Repeat phrase 0 points 0 points  Total Score 0 points 0 points    Immunizations Immunization History  Administered Date(s) Administered   DTaP / IPV 07/22/2019, 10/21/2019, 01/27/2020   Fluad Quad(high Dose 65+) 01/26/2022   HIB (PRP-OMP) 07/22/2019, 10/21/2019, 01/27/2020   HIB (PRP-T) 07/22/2019, 10/21/2019, 01/27/2020   Hepatitis A, Adult 07/21/2020   Hepatitis B, PED/ADOLESCENT 07/22/2019, 10/21/2019, 05/04/2020   Influenza Split 06/01/2014, 02/11/2015, 02/17/2016   Influenza, High Dose Seasonal PF 01/27/2018, 06/23/2021   Influenza,inj,Quad PF,6+ Mos 06/01/2014, 02/11/2015, 02/17/2016   Influenza-Unspecified 06/01/2014, 02/11/2015, 02/17/2016, 01/27/2020, 06/23/2021   Moderna SARS-COV2 Booster Vaccination 10/04/2020   Moderna Sars-Covid-2 Vaccination 05/30/2019, 06/27/2019, 01/14/2020   Pneumococcal Conjugate-13 07/22/2019, 10/21/2019, 01/27/2020   Pneumococcal Polysaccharide-23 05/04/2020   Td 07/31/2011   Zoster Recombinat (Shingrix) 01/27/2018, 05/13/2020, 11/03/2020   Zoster, Unspecified 01/27/2018    TDAP status: Up to date  Flu Vaccine status: Up to date  Pneumococcal vaccine status: Up to date  Covid-19 vaccine status: Completed vaccines  Qualifies for Shingles Vaccine? Yes   Zostavax completed Yes   Shingrix Completed?: Yes  Screening Tests Health Maintenance  Topic Date Due   COVID-19 Vaccine (4 - Moderna risk series) 11/29/2020   TETANUS/TDAP  07/30/2021   Medicare Annual Wellness (AWV)  02/17/2023   COLONOSCOPY (Pts 45-24yr Insurance coverage will need to be confirmed)  01/18/2029   Pneumonia Vaccine 70 Years old  Completed   INFLUENZA VACCINE  Completed   Hepatitis  C Screening  Completed   Zoster Vaccines- Shingrix  Completed   HPV VACCINES  Aged Out    Health Maintenance  Health Maintenance Due  Topic Date Due   COVID-19 Vaccine (4 - Moderna risk series) 11/29/2020   TETANUS/TDAP  07/30/2021    Colorectal cancer  screening: Type of screening: Colonoscopy. Completed 01/19/2019. Repeat every 10 years  Lung Cancer Screening: (Low Dose CT Chest recommended if Age 24-80 years, 30 pack-year currently smoking OR have quit w/in 15years.) does not qualify.   Lung Cancer Screening Referral: n/a  Additional Screening:  Hepatitis C Screening: does not qualify;   Vision Screening: Recommended annual ophthalmology exams for early detection of glaucoma and other disorders of the eye. Is the patient up to date with their annual eye exam?  Yes  Who is the provider or what is the name of the office in which the patient attends annual eye exams? Dr.Wall  If pt is not established with a provider, would they like to be referred to a provider to establish care? Yes .   Dental Screening: Recommended annual dental exams for proper oral hygiene  Community Resource Referral / Chronic Care Management: CRR required this visit?  No   CCM required this visit?  No      Plan:     I have personally reviewed and noted the following in the patient's chart:   Medical and social history Use of alcohol, tobacco or illicit drugs  Current medications and supplements including opioid prescriptions. Patient is not currently taking opioid prescriptions. Functional ability and status Nutritional status Physical activity Advanced directives List of other physicians Hospitalizations, surgeries, and ER visits in previous 12 months Vitals Screenings to include cognitive, depression, and falls Referrals and appointments  In addition, I have reviewed and discussed with patient certain preventive protocols, quality metrics, and best practice recommendations. A written personalized care plan for preventive services as well as general preventive health recommendations were provided to patient.     Daphane Shepherd, LPN   [id number]   Nurse Notes: none

## 2022-05-25 ENCOUNTER — Encounter: Payer: Self-pay | Admitting: Family Medicine

## 2022-05-25 ENCOUNTER — Ambulatory Visit (INDEPENDENT_AMBULATORY_CARE_PROVIDER_SITE_OTHER): Payer: Medicare HMO | Admitting: Family Medicine

## 2022-05-25 VITALS — BP 134/79 | HR 87 | Ht 66.0 in | Wt 175.0 lb

## 2022-05-25 DIAGNOSIS — R058 Other specified cough: Secondary | ICD-10-CM

## 2022-05-25 MED ORDER — HYDROCODONE BIT-HOMATROP MBR 5-1.5 MG/5ML PO SOLN: 5.0000 mL | Freq: Four times a day (QID) | ORAL | 0 refills | Status: DC | PRN

## 2022-05-25 MED ORDER — PREDNISONE 20 MG PO TABS: ORAL_TABLET | ORAL | 0 refills | Status: DC

## 2022-05-25 MED ORDER — FLUTICASONE PROPIONATE 50 MCG/ACT NA SUSP: 1.0000 | Freq: Two times a day (BID) | NASAL | 6 refills | Status: DC | PRN

## 2022-05-25 NOTE — Progress Notes (Signed)
BP 134/79   Pulse 87   Ht 5' 6"$  (1.676 m)   Wt 175 lb (79.4 kg)   SpO2 98%   BMI 28.25 kg/m    Subjective:   Patient ID: Shawn Cline, male    DOB: 03/02/1951, 72 y.o.   MRN: [id number]  HPI: Shawn Cline is a 72 y.o. male presenting on 05/25/2022 for URI (Present for one month. Not improving with OTC meds)   HPI Patient comes in complaining of cough and congestion and nasal drainage and postnasal changes has been going on over the past month.  He says he had viral-like illness initially and then he just has not been able to clear it completely.  Says is been going on for about a month now.  He says he did use some Robitussin but was sick and asked allergy pill and had some help but not much.  He did have a little bit of hydrocodone cough syrup initially but then ran out of that.  He is just been more annoyed because it just is not clearing.  Relevant past medical, surgical, family and social history reviewed and updated as indicated. Interim medical history since our last visit reviewed. Allergies and medications reviewed and updated.  Review of Systems  Constitutional:  Negative for chills and fever.  HENT:  Positive for congestion, postnasal drip, rhinorrhea and sinus pressure. Negative for ear discharge, ear pain, sneezing, sore throat and voice change.   Eyes:  Negative for pain, discharge, redness and visual disturbance.  Respiratory:  Positive for cough. Negative for shortness of breath and wheezing.   Cardiovascular:  Negative for chest pain and leg swelling.  Musculoskeletal:  Negative for gait problem.  Skin:  Negative for rash.  All other systems reviewed and are negative.   Per HPI unless specifically indicated above   Allergies as of 05/25/2022   No Known Allergies      Medication List        Accurate as of May 25, 2022  3:45 PM. If you have any questions, ask your nurse or doctor.          aspirin EC 81 MG tablet Take 81 mg by mouth daily.    cholecalciferol 25 MCG (1000 UNIT) tablet Commonly known as: VITAMIN D3 Take 1 tablet (1,000 Units total) by mouth daily.   fluticasone 50 MCG/ACT nasal spray Commonly known as: FLONASE Place 1 spray into both nostrils 2 (two) times daily as needed for allergies or rhinitis. Started by: Fransisca Kaufmann Loistine Eberlin, MD   HYDROcodone bit-homatropine 5-1.5 MG/5ML syrup Commonly known as: HYCODAN Take 5 mLs by mouth every 6 (six) hours as needed for cough.   loratadine 10 MG dissolvable tablet Commonly known as: CLARITIN REDITABS Take by mouth.   melatonin 5 MG Tabs Take by mouth.   omeprazole 20 MG capsule Commonly known as: PRILOSEC Take 20 mg by mouth daily.   predniSONE 20 MG tablet Commonly known as: DELTASONE 2 po at same time daily for 5 days Started by: Fransisca Kaufmann Wanell Lorenzi, MD   simvastatin 40 MG tablet Commonly known as: ZOCOR Take 1 tablet (40 mg total) by mouth every other day.         Objective:   BP 134/79   Pulse 87   Ht 5' 6"$  (1.676 m)   Wt 175 lb (79.4 kg)   SpO2 98%   BMI 28.25 kg/m   Wt Readings from Last 3 Encounters:  05/25/22 175 lb (79.4 kg)  02/23/22 173  lb (78.5 kg)  02/16/22 174 lb (78.9 kg)    Physical Exam Vitals and nursing note reviewed.  Constitutional:      General: He is not in acute distress.    Appearance: He is well-developed. He is not diaphoretic.  HENT:     Right Ear: Tympanic membrane, ear canal and external ear normal.     Left Ear: Tympanic membrane, ear canal and external ear normal.     Nose: Mucosal edema and congestion present. No rhinorrhea.     Right Sinus: No maxillary sinus tenderness or frontal sinus tenderness.     Left Sinus: No maxillary sinus tenderness or frontal sinus tenderness.     Mouth/Throat:     Pharynx: Uvula midline. No oropharyngeal exudate or posterior oropharyngeal erythema.     Tonsils: No tonsillar abscesses.  Eyes:     General: No scleral icterus.    Conjunctiva/sclera: Conjunctivae normal.   Neck:     Thyroid: No thyromegaly.  Cardiovascular:     Rate and Rhythm: Normal rate and regular rhythm.     Heart sounds: Normal heart sounds. No murmur heard. Pulmonary:     Effort: Pulmonary effort is normal. No respiratory distress.     Breath sounds: Normal breath sounds. No wheezing, rhonchi or rales.  Musculoskeletal:        General: Normal range of motion.     Cervical back: Neck supple.  Lymphadenopathy:     Cervical: No cervical adenopathy.  Skin:    General: Skin is warm and dry.     Findings: No rash.  Neurological:     Mental Status: He is alert and oriented to person, place, and time.     Coordination: Coordination normal.  Psychiatric:        Behavior: Behavior normal.       Assessment & Plan:   Problem List Items Addressed This Visit   None Visit Diagnoses     Post-viral cough syndrome    -  Primary   Relevant Medications   predniSONE (DELTASONE) 20 MG tablet   fluticasone (FLONASE) 50 MCG/ACT nasal spray   HYDROcodone bit-homatropine (HYCODAN) 5-1.5 MG/5ML syrup     Will give the hydrocodone cough syrup and Flonase and Prednisone  Follow up plan: Return if symptoms worsen or fail to improve.  Counseling provided for all of the vaccine components No orders of the defined types were placed in this encounter.   Caryl Pina, MD [facility] 05/25/2022, 3:45 PM

## 2022-06-01 ENCOUNTER — Telehealth: Payer: Self-pay | Admitting: Family Medicine

## 2022-06-01 DIAGNOSIS — R058 Other specified cough: Secondary | ICD-10-CM

## 2022-06-01 MED ORDER — PREDNISONE 20 MG PO TABS: ORAL_TABLET | ORAL | 0 refills | Status: DC

## 2022-06-01 MED ORDER — AMOXICILLIN-POT CLAVULANATE 875-125 MG PO TABS: 1.0000 | ORAL_TABLET | Freq: Two times a day (BID) | ORAL | 0 refills | Status: DC

## 2022-06-01 MED ORDER — BENZONATATE 200 MG PO CAPS: 200.0000 mg | ORAL_CAPSULE | Freq: Two times a day (BID) | ORAL | 0 refills | Status: DC | PRN

## 2022-06-01 NOTE — Telephone Encounter (Signed)
I sent in some different medicines to his pharmacy that he can try.

## 2022-06-01 NOTE — Telephone Encounter (Signed)
Pt said the medication helped only some while taking. He coughed the whole phone conversation. It is a dry, hacking cough, non productive.

## 2022-06-01 NOTE — Telephone Encounter (Signed)
Did it help while he was on it? Has any of it helped?

## 2022-06-01 NOTE — Telephone Encounter (Signed)
Patient had appt on 2/9 for a cough and said that the medication he was given has not helped. He is not feeling any better and is out of medication. Would like to know what else he can do to treat. Please call back.

## 2022-06-04 ENCOUNTER — Encounter: Payer: Self-pay | Admitting: Family Medicine

## 2022-06-04 NOTE — Telephone Encounter (Signed)
Pt has been made aware. He is feeling better today.

## 2022-06-05 ENCOUNTER — Ambulatory Visit (INDEPENDENT_AMBULATORY_CARE_PROVIDER_SITE_OTHER): Payer: Medicare HMO | Admitting: Family Medicine

## 2022-06-05 ENCOUNTER — Ambulatory Visit (INDEPENDENT_AMBULATORY_CARE_PROVIDER_SITE_OTHER): Payer: Medicare HMO

## 2022-06-05 ENCOUNTER — Encounter: Payer: Self-pay | Admitting: Family Medicine

## 2022-06-05 VITALS — BP 115/72 | HR 74 | Temp 97.7°F | Ht 66.0 in | Wt 166.8 lb

## 2022-06-05 DIAGNOSIS — K529 Noninfective gastroenteritis and colitis, unspecified: Secondary | ICD-10-CM

## 2022-06-05 DIAGNOSIS — R058 Other specified cough: Secondary | ICD-10-CM

## 2022-06-05 DIAGNOSIS — R059 Cough, unspecified: Secondary | ICD-10-CM

## 2022-06-05 DIAGNOSIS — C8310 Mantle cell lymphoma, unspecified site: Secondary | ICD-10-CM | POA: Diagnosis not present

## 2022-06-05 DIAGNOSIS — J449 Chronic obstructive pulmonary disease, unspecified: Secondary | ICD-10-CM | POA: Diagnosis not present

## 2022-06-05 DIAGNOSIS — R0600 Dyspnea, unspecified: Secondary | ICD-10-CM | POA: Diagnosis not present

## 2022-06-05 MED ORDER — PREDNISONE 20 MG PO TABS: ORAL_TABLET | ORAL | 0 refills | Status: DC

## 2022-06-05 MED ORDER — ALBUTEROL SULFATE HFA 108 (90 BASE) MCG/ACT IN AERS: 2.0000 | INHALATION_SPRAY | Freq: Four times a day (QID) | RESPIRATORY_TRACT | 11 refills | Status: DC | PRN

## 2022-06-05 MED ORDER — HYDROCODONE BIT-HOMATROP MBR 5-1.5 MG/5ML PO SOLN: 5.0000 mL | Freq: Four times a day (QID) | ORAL | 0 refills | Status: AC | PRN

## 2022-06-05 MED ORDER — DIPHENOXYLATE-ATROPINE 2.5-0.025 MG PO TABS: 2.0000 | ORAL_TABLET | Freq: Four times a day (QID) | ORAL | 0 refills | Status: DC | PRN

## 2022-06-05 NOTE — Progress Notes (Signed)
Subjective:  Patient ID: Shawn Cline, male    DOB: [birth year]  Age: 72 y.o. MRN: [id number]  CC: Cough   HPI Shawn Cline presents for cough persisting for 2 months. Treated twice recently. Still not better. Had profuse diarrhea last 3 days after starting augmentin. Some DOE. Not at rest. Cough mostly dry. No fever. Minimal improvement with prednisone.      06/05/2022   11:17 AM 05/25/2022    3:22 PM 05/25/2022    3:21 PM  Depression screen PHQ 2/9  Decreased Interest 0 0 0  Down, Depressed, Hopeless 0 0 0  PHQ - 2 Score 0 0 0  Altered sleeping  0   Tired, decreased energy  0   Change in appetite  0   Feeling bad or failure about yourself   0   Trouble concentrating  0   Moving slowly or fidgety/restless  0   Suicidal thoughts  0   PHQ-9 Score  0   Difficult doing work/chores  Not difficult at all     History Shawn Cline has a past medical history of Hypercholesteremia.   He has a past surgical history that includes Hemorrhoid surgery.   His family history includes Atrial fibrillation in his mother; Cancer in his father; Dementia in his mother.He reports that he quit smoking about 30 years ago. His smoking use included cigarettes. He has never used smokeless tobacco. He reports that he does not drink alcohol and does not use drugs.    ROS Review of Systems  Constitutional:  Positive for appetite change. Negative for fever.  Respiratory:  Positive for cough.   Cardiovascular:  Negative for chest pain.  Gastrointestinal:  Positive for abdominal pain and diarrhea. Negative for rectal pain and vomiting.  Musculoskeletal:  Negative for arthralgias.  Skin:  Negative for rash.  CXR - no infiltrate. No COPD Preliminary reading done by Randell Loop  Objective:  BP 115/72   Pulse 74   Temp 97.7 F (36.5 C)   Ht 5' 6"$  (1.676 m)   Wt 166 lb 12.8 oz (75.7 kg)   SpO2 97%   BMI 26.92 kg/m   BP Readings from Last 3 Encounters:  06/05/22 115/72  05/25/22 134/79   02/23/22 131/81    Wt Readings from Last 3 Encounters:  06/05/22 166 lb 12.8 oz (75.7 kg)  05/25/22 175 lb (79.4 kg)  02/23/22 173 lb (78.5 kg)     Physical Exam Constitutional:      General: He is not in acute distress.    Appearance: He is well-developed.  HENT:     Head: Normocephalic and atraumatic.     Right Ear: External ear normal.     Left Ear: External ear normal.     Nose: Nose normal.  Eyes:     Conjunctiva/sclera: Conjunctivae normal.     Pupils: Pupils are equal, round, and reactive to light.  Cardiovascular:     Rate and Rhythm: Normal rate and regular rhythm.     Heart sounds: Normal heart sounds. No murmur heard. Pulmonary:     Effort: Pulmonary effort is normal. No respiratory distress.     Breath sounds: Normal breath sounds. No wheezing or rales.  Abdominal:     Palpations: Abdomen is soft.     Tenderness: There is no abdominal tenderness.  Musculoskeletal:        General: Normal range of motion.     Cervical back: Normal range of motion and neck supple.  Skin:  General: Skin is warm and dry.  Neurological:     Mental Status: He is alert and oriented to person, place, and time.     Deep Tendon Reflexes: Reflexes are normal and symmetric.  Psychiatric:        Behavior: Behavior normal.        Thought Content: Thought content normal.        Judgment: Judgment normal.       Assessment & Plan:   Shawn Cline was seen today for cough.  Diagnoses and all orders for this visit:  Cough in adult -     DG Chest 2 View; Future -     COVID-19, Flu A+B and RSV  Mantle cell lymphoma, unspecified body region [facility]) -     DG Chest 2 View; Future  Post-viral cough syndrome -     predniSONE (DELTASONE) 20 MG tablet; 2 po at same time daily for 5 days  Diarrhea, secretory  Other orders -     diphenoxylate-atropine (LOMOTIL) 2.5-0.025 MG tablet; Take 2 tablets by mouth 4 (four) times daily as needed for diarrhea or loose stools. -     HYDROcodone  bit-homatropine (HYCODAN) 5-1.5 MG/5ML syrup; Take 5 mLs by mouth every 6 (six) hours as needed for up to 5 days for cough. -     albuterol (VENTOLIN HFA) 108 (90 Base) MCG/ACT inhaler; Inhale 2 puffs into the lungs every 6 (six) hours as needed for wheezing or shortness of breath.       I have discontinued Shawn Aldo "Doug"'s HYDROcodone bit-homatropine. I am also having him start on diphenoxylate-atropine, HYDROcodone bit-homatropine, and albuterol. Additionally, I am having him maintain his aspirin EC, cholecalciferol, omeprazole, melatonin, loratadine, simvastatin, fluticasone, amoxicillin-clavulanate, benzonatate, and predniSONE.  Allergies as of 06/05/2022   No Known Allergies      Medication List        Accurate as of June 05, 2022  9:54 PM. If you have any questions, ask your nurse or doctor.          albuterol 108 (90 Base) MCG/ACT inhaler Commonly known as: VENTOLIN HFA Inhale 2 puffs into the lungs every 6 (six) hours as needed for wheezing or shortness of breath. Started by: Claretta Fraise, MD   amoxicillin-clavulanate [id number] MG tablet Commonly known as: AUGMENTIN Take 1 tablet by mouth 2 (two) times daily.   aspirin EC 81 MG tablet Take 81 mg by mouth daily.   benzonatate 200 MG capsule Commonly known as: TESSALON Take 1 capsule (200 mg total) by mouth 2 (two) times daily as needed for cough.   cholecalciferol 25 MCG (1000 UT) tablet Generic drug: Cholecalciferol Take 1 tablet (1,000 Units total) by mouth daily.   diphenoxylate-atropine 2.5-0.025 MG tablet Commonly known as: Lomotil Take 2 tablets by mouth 4 (four) times daily as needed for diarrhea or loose stools. Started by: Claretta Fraise, MD   fluticasone 50 MCG/ACT nasal spray Commonly known as: FLONASE Place 1 spray into both nostrils 2 (two) times daily as needed for allergies or rhinitis.   HYDROcodone bit-homatropine 5-1.5 MG/5ML syrup Commonly known as: HYCODAN Take 5 mLs by mouth  every 6 (six) hours as needed for up to 5 days for cough.   loratadine 10 MG dissolvable tablet Commonly known as: CLARITIN REDITABS Take by mouth.   melatonin 5 MG Tabs Take by mouth.   omeprazole 20 MG capsule Commonly known as: PRILOSEC Take 20 mg by mouth daily.   predniSONE 20 MG tablet Commonly known as: DELTASONE  2 po at same time daily for 5 days   simvastatin 40 MG tablet Commonly known as: ZOCOR Take 1 tablet (40 mg total) by mouth every other day.         Follow-up: Return if symptoms worsen or fail to improve.  Claretta Fraise, M.D.

## 2022-06-06 DIAGNOSIS — H6123 Impacted cerumen, bilateral: Secondary | ICD-10-CM | POA: Diagnosis not present

## 2022-06-06 DIAGNOSIS — J069 Acute upper respiratory infection, unspecified: Secondary | ICD-10-CM | POA: Diagnosis not present

## 2022-06-06 DIAGNOSIS — R053 Chronic cough: Secondary | ICD-10-CM | POA: Diagnosis not present

## 2022-06-06 DIAGNOSIS — D61818 Other pancytopenia: Secondary | ICD-10-CM | POA: Diagnosis not present

## 2022-06-06 DIAGNOSIS — C8310 Mantle cell lymphoma, unspecified site: Secondary | ICD-10-CM | POA: Diagnosis not present

## 2022-06-06 LAB — COVID-19, FLU A+B AND RSV
Influenza A, NAA: NOT DETECTED
Influenza B, NAA: NOT DETECTED
RSV, NAA: NOT DETECTED
SARS-CoV-2, NAA: NOT DETECTED

## 2022-06-08 DIAGNOSIS — R846 Abnormal cytological findings in specimens from respiratory organs and thorax: Secondary | ICD-10-CM | POA: Diagnosis not present

## 2022-06-08 DIAGNOSIS — J189 Pneumonia, unspecified organism: Secondary | ICD-10-CM | POA: Diagnosis not present

## 2022-06-08 DIAGNOSIS — Z9484 Stem cells transplant status: Secondary | ICD-10-CM | POA: Diagnosis not present

## 2022-06-08 DIAGNOSIS — D72810 Lymphocytopenia: Secondary | ICD-10-CM | POA: Diagnosis not present

## 2022-06-08 DIAGNOSIS — R0602 Shortness of breath: Secondary | ICD-10-CM | POA: Diagnosis not present

## 2022-06-08 DIAGNOSIS — R051 Acute cough: Secondary | ICD-10-CM | POA: Diagnosis not present

## 2022-06-08 DIAGNOSIS — C8310 Mantle cell lymphoma, unspecified site: Secondary | ICD-10-CM | POA: Diagnosis not present

## 2022-06-08 DIAGNOSIS — C8313 Mantle cell lymphoma, intra-abdominal lymph nodes: Secondary | ICD-10-CM | POA: Diagnosis not present

## 2022-06-08 DIAGNOSIS — E785 Hyperlipidemia, unspecified: Secondary | ICD-10-CM | POA: Diagnosis not present

## 2022-06-08 DIAGNOSIS — Z9981 Dependence on supplemental oxygen: Secondary | ICD-10-CM | POA: Diagnosis not present

## 2022-06-08 DIAGNOSIS — Z87891 Personal history of nicotine dependence: Secondary | ICD-10-CM | POA: Diagnosis not present

## 2022-06-08 DIAGNOSIS — D801 Nonfamilial hypogammaglobulinemia: Secondary | ICD-10-CM | POA: Diagnosis not present

## 2022-06-08 DIAGNOSIS — R140 Abdominal distension (gaseous): Secondary | ICD-10-CM | POA: Diagnosis not present

## 2022-06-08 DIAGNOSIS — R058 Other specified cough: Secondary | ICD-10-CM | POA: Diagnosis not present

## 2022-06-08 DIAGNOSIS — I129 Hypertensive chronic kidney disease with stage 1 through stage 4 chronic kidney disease, or unspecified chronic kidney disease: Secondary | ICD-10-CM | POA: Diagnosis not present

## 2022-06-08 DIAGNOSIS — J810 Acute pulmonary edema: Secondary | ICD-10-CM | POA: Diagnosis not present

## 2022-06-08 DIAGNOSIS — R059 Cough, unspecified: Secondary | ICD-10-CM | POA: Diagnosis not present

## 2022-06-08 DIAGNOSIS — N189 Chronic kidney disease, unspecified: Secondary | ICD-10-CM | POA: Diagnosis not present

## 2022-06-08 DIAGNOSIS — R918 Other nonspecific abnormal finding of lung field: Secondary | ICD-10-CM | POA: Diagnosis not present

## 2022-06-08 DIAGNOSIS — D802 Selective deficiency of immunoglobulin A [IgA]: Secondary | ICD-10-CM | POA: Diagnosis not present

## 2022-06-08 DIAGNOSIS — D849 Immunodeficiency, unspecified: Secondary | ICD-10-CM | POA: Diagnosis not present

## 2022-06-08 DIAGNOSIS — R509 Fever, unspecified: Secondary | ICD-10-CM | POA: Diagnosis not present

## 2022-06-08 DIAGNOSIS — E8770 Fluid overload, unspecified: Secondary | ICD-10-CM | POA: Diagnosis not present

## 2022-06-08 DIAGNOSIS — I4510 Unspecified right bundle-branch block: Secondary | ICD-10-CM | POA: Diagnosis not present

## 2022-06-08 DIAGNOSIS — K219 Gastro-esophageal reflux disease without esophagitis: Secondary | ICD-10-CM | POA: Diagnosis not present

## 2022-06-08 DIAGNOSIS — Z8616 Personal history of COVID-19: Secondary | ICD-10-CM | POA: Diagnosis not present

## 2022-06-08 DIAGNOSIS — J9601 Acute respiratory failure with hypoxia: Secondary | ICD-10-CM | POA: Diagnosis not present

## 2022-06-20 DIAGNOSIS — R0602 Shortness of breath: Secondary | ICD-10-CM | POA: Diagnosis not present

## 2022-06-20 DIAGNOSIS — E8770 Fluid overload, unspecified: Secondary | ICD-10-CM | POA: Diagnosis not present

## 2022-06-25 ENCOUNTER — Ambulatory Visit (INDEPENDENT_AMBULATORY_CARE_PROVIDER_SITE_OTHER): Payer: Medicare HMO | Admitting: Family Medicine

## 2022-06-25 ENCOUNTER — Encounter: Payer: Self-pay | Admitting: Family Medicine

## 2022-06-25 VITALS — BP 118/79 | HR 74 | Ht 66.0 in | Wt 156.0 lb

## 2022-06-25 DIAGNOSIS — N1831 Chronic kidney disease, stage 3a: Secondary | ICD-10-CM

## 2022-06-25 DIAGNOSIS — R739 Hyperglycemia, unspecified: Secondary | ICD-10-CM | POA: Diagnosis not present

## 2022-06-25 DIAGNOSIS — J189 Pneumonia, unspecified organism: Secondary | ICD-10-CM | POA: Diagnosis not present

## 2022-06-25 LAB — BAYER DCA HB A1C WAIVED: HB A1C (BAYER DCA - WAIVED): 6.4 % — ABNORMAL HIGH (ref 4.8–5.6)

## 2022-06-25 MED ORDER — HYDROCODONE BIT-HOMATROP MBR 5-1.5 MG/5ML PO SOLN: 5.0000 mL | Freq: Three times a day (TID) | ORAL | 0 refills | Status: DC | PRN

## 2022-06-25 NOTE — Progress Notes (Signed)
BP 118/79   Pulse 74   Ht '5\' 6"'$  (1.676 m)   Wt 156 lb (70.8 kg)   BMI 25.18 kg/m    Subjective:   Patient ID: Shawn Cline, male    DOB: December 08, 1950, 72 y.o.   MRN: [id number]  HPI: Shawn Cline is a 73 y.o. male presenting on 06/25/2022 for Hospitalization Follow-up (S/P Pneumonia) and Cough (persistent)   HPI Pneumonia and hospital follow-up Patient is coming in today for pneumonia follow-up from the hospital.  Patient was admitted on 06/08/2022 and discharged on 06/20/2022 for acute hypoxic respiratory failure and community-acquired pneumonia and new type 2 diabetes.  He was started on metformin 500 twice a day.  He is still on sulfa antibiotic and finishing that.  He is also still on prednisone to help with his breathing.  He is still having coughing but he is doing better.  He is not having machine to check blood sugars but is trying to watch diet.  He has been on prednisone for more than 4 weeks now.  He does have an albuterol inhaler.  He also has follow-up with pulmonology and a CT chest ordered for follow-up.  He is better since leaving the hospital but still not completely over the illness.  Relevant past medical, surgical, family and social history reviewed and updated as indicated. Interim medical history since our last visit reviewed. Allergies and medications reviewed and updated.  Review of Systems  Constitutional:  Positive for fatigue. Negative for chills and fever.  HENT:  Positive for congestion.   Respiratory:  Positive for cough and shortness of breath. Negative for wheezing.   Cardiovascular:  Negative for chest pain and leg swelling.  Musculoskeletal:  Negative for back pain and gait problem.  Skin:  Negative for rash.  Neurological:  Positive for weakness.  All other systems reviewed and are negative.   Per HPI unless specifically indicated above   Allergies as of 06/25/2022   No Known Allergies      Medication List        Accurate as of June 25, 2022  9:37 AM. If you have any questions, ask your nurse or doctor.          STOP taking these medications    amoxicillin-clavulanate 875-125 MG tablet Commonly known as: AUGMENTIN Stopped by: Worthy Rancher, MD   diphenoxylate-atropine 2.5-0.025 MG tablet Commonly known as: Lomotil Stopped by: Fransisca Kaufmann Almon Whitford, MD   melatonin 5 MG Tabs Stopped by: Fransisca Kaufmann Lashane Whelpley, MD       TAKE these medications    albuterol 108 (90 Base) MCG/ACT inhaler Commonly known as: VENTOLIN HFA Inhale 2 puffs into the lungs every 6 (six) hours as needed for wheezing or shortness of breath.   aspirin EC 81 MG tablet Take 81 mg by mouth daily.   benzonatate 200 MG capsule Commonly known as: TESSALON Take 1 capsule (200 mg total) by mouth 2 (two) times daily as needed for cough.   cholecalciferol 25 MCG (1000 UT) tablet Generic drug: Cholecalciferol Take 1 tablet (1,000 Units total) by mouth daily.   fluticasone 50 MCG/ACT nasal spray Commonly known as: FLONASE Place 1 spray into both nostrils 2 (two) times daily as needed for allergies or rhinitis.   furosemide 20 MG tablet Commonly known as: LASIX Take 20 mg by mouth daily.   HYDROcodone bit-homatropine 5-1.5 MG/5ML syrup Commonly known as: HYCODAN Take 5 mLs by mouth every 8 (eight) hours as needed for cough. Started by:  Fransisca Kaufmann Bethania Schlotzhauer, MD   loratadine 10 MG dissolvable tablet Commonly known as: CLARITIN REDITABS Take by mouth.   metFORMIN 500 MG 24 hr tablet Commonly known as: GLUCOPHAGE-XR Take 500 mg by mouth 2 (two) times daily with a meal.   omeprazole 20 MG capsule Commonly known as: PRILOSEC Take 20 mg by mouth daily.   predniSONE 20 MG tablet Commonly known as: DELTASONE 2 po at same time daily for 5 days   simvastatin 40 MG tablet Commonly known as: ZOCOR Take 1 tablet (40 mg total) by mouth every other day.   sulfamethoxazole-trimethoprim 800-160 MG tablet Commonly known as: BACTRIM DS Take  1 tablet by mouth 3 (three) times a week. MWF         Objective:   BP 118/79   Pulse 74   Ht '5\' 6"'$  (1.676 m)   Wt 156 lb (70.8 kg)   BMI 25.18 kg/m   Wt Readings from Last 3 Encounters:  06/25/22 156 lb (70.8 kg)  06/05/22 166 lb 12.8 oz (75.7 kg)  05/25/22 175 lb (79.4 kg)    Physical Exam Vitals and nursing note reviewed.  Constitutional:      General: He is not in acute distress.    Appearance: He is well-developed. He is not diaphoretic.  Eyes:     General: No scleral icterus.    Conjunctiva/sclera: Conjunctivae normal.  Neck:     Thyroid: No thyromegaly.  Cardiovascular:     Rate and Rhythm: Normal rate and regular rhythm.     Heart sounds: Normal heart sounds. No murmur heard. Pulmonary:     Effort: Pulmonary effort is normal. No respiratory distress.     Breath sounds: Normal breath sounds. No wheezing.  Musculoskeletal:        General: Normal range of motion.     Cervical back: Neck supple.  Lymphadenopathy:     Cervical: No cervical adenopathy.  Skin:    General: Skin is warm and dry.     Findings: No rash.  Neurological:     Mental Status: He is alert and oriented to person, place, and time.     Coordination: Coordination normal.  Psychiatric:        Behavior: Behavior normal.       Assessment & Plan:   Problem List Items Addressed This Visit       Genitourinary   CKD (chronic kidney disease) stage 3, GFR 30-59 ml/min (HCC)   Relevant Orders   CBC with Differential/Platelet   CMP14+EGFR   Other Visit Diagnoses     Community acquired pneumonia, unspecified laterality    -  Primary   Relevant Medications   sulfamethoxazole-trimethoprim (BACTRIM DS) 800-160 MG tablet   HYDROcodone bit-homatropine (HYCODAN) 5-1.5 MG/5ML syrup   Other Relevant Orders   CBC with Differential/Platelet   CMP14+EGFR   Elevated blood sugar       Relevant Orders   Bayer DCA Hb A1c Waived       Continue current medicine, will watch his A1c closely.   Refilled some cough syrup for him. Follow up plan: Return if symptoms worsen or fail to improve, for Already has an appointment in April.  Counseling provided for all of the vaccine components Orders Placed This Encounter  Procedures   CBC with Differential/Platelet   CMP14+EGFR   Bayer DCA Hb A1c Waived    Caryl Pina, MD [facility] 06/25/2022, 9:37 AM

## 2022-06-26 LAB — CMP14+EGFR
ALT: 74 IU/L — ABNORMAL HIGH (ref 0–44)
AST: 22 IU/L (ref 0–40)
Albumin/Globulin Ratio: 1.6 (ref 1.2–2.2)
Albumin: 3.8 g/dL (ref 3.8–4.8)
Alkaline Phosphatase: 63 IU/L (ref 44–121)
BUN/Creatinine Ratio: 28 — ABNORMAL HIGH (ref 10–24)
BUN: 41 mg/dL — ABNORMAL HIGH (ref 8–27)
Bilirubin Total: 0.7 mg/dL (ref 0.0–1.2)
CO2: 24 mmol/L (ref 20–29)
Calcium: 10.2 mg/dL (ref 8.6–10.2)
Chloride: 96 mmol/L (ref 96–106)
Creatinine, Ser: 1.45 mg/dL — ABNORMAL HIGH (ref 0.76–1.27)
Globulin, Total: 2.4 g/dL (ref 1.5–4.5)
Glucose: 111 mg/dL — ABNORMAL HIGH (ref 70–99)
Potassium: 4.5 mmol/L (ref 3.5–5.2)
Sodium: 139 mmol/L (ref 134–144)
Total Protein: 6.2 g/dL (ref 6.0–8.5)
eGFR: 52 mL/min/{1.73_m2} — ABNORMAL LOW (ref >59)

## 2022-06-26 LAB — CBC WITH DIFFERENTIAL/PLATELET
Basophils Absolute: 0 10*3/uL (ref 0.0–0.2)
Basos: 0 %
EOS (ABSOLUTE): 0 10*3/uL (ref 0.0–0.4)
Eos: 0 %
Hematocrit: 42.7 % (ref 37.5–51.0)
Hemoglobin: 14.5 g/dL (ref 13.0–17.7)
Immature Grans (Abs): 0.1 10*3/uL (ref 0.0–0.1)
Immature Granulocytes: 2 %
Lymphocytes Absolute: 0.6 10*3/uL — ABNORMAL LOW (ref 0.7–3.1)
Lymphs: 6 %
MCH: 31.9 pg (ref 26.6–33.0)
MCHC: 34 g/dL (ref 31.5–35.7)
MCV: 94 fL (ref 79–97)
Monocytes Absolute: 1.1 10*3/uL — ABNORMAL HIGH (ref 0.1–0.9)
Monocytes: 11 %
Neutrophils Absolute: 7.5 10*3/uL — ABNORMAL HIGH (ref 1.4–7.0)
Neutrophils: 81 %
Platelets: 291 10*3/uL (ref 150–450)
RBC: 4.55 x10E6/uL (ref 4.14–5.80)
RDW: 12.7 % (ref 11.6–15.4)
WBC: 9.4 10*3/uL (ref 3.4–10.8)

## 2022-07-10 ENCOUNTER — Encounter: Payer: Self-pay | Admitting: Family Medicine

## 2022-07-21 DIAGNOSIS — R0602 Shortness of breath: Secondary | ICD-10-CM | POA: Diagnosis not present

## 2022-07-21 DIAGNOSIS — R0902 Hypoxemia: Secondary | ICD-10-CM | POA: Diagnosis not present

## 2022-07-25 ENCOUNTER — Other Ambulatory Visit: Payer: Medicare HMO

## 2022-07-25 ENCOUNTER — Other Ambulatory Visit: Payer: Self-pay

## 2022-07-25 DIAGNOSIS — N1831 Chronic kidney disease, stage 3a: Secondary | ICD-10-CM | POA: Diagnosis not present

## 2022-07-26 LAB — LIPID PANEL
Chol/HDL Ratio: 2.9 ratio (ref 0.0–5.0)
Cholesterol, Total: 216 mg/dL — ABNORMAL HIGH (ref 100–199)
HDL: 74 mg/dL (ref >39)
LDL Chol Calc (NIH): 117 mg/dL — ABNORMAL HIGH (ref 0–99)
Triglycerides: 147 mg/dL (ref 0–149)
VLDL Cholesterol Cal: 25 mg/dL (ref 5–40)

## 2022-07-26 LAB — CMP14+EGFR
ALT: 31 IU/L (ref 0–44)
AST: 16 IU/L (ref 0–40)
Albumin/Globulin Ratio: 2.4 — ABNORMAL HIGH (ref 1.2–2.2)
Albumin: 4.3 g/dL (ref 3.8–4.8)
Alkaline Phosphatase: 36 IU/L — ABNORMAL LOW (ref 44–121)
BUN/Creatinine Ratio: 23 (ref 10–24)
BUN: 32 mg/dL — ABNORMAL HIGH (ref 8–27)
Bilirubin Total: 0.5 mg/dL (ref 0.0–1.2)
CO2: 26 mmol/L (ref 20–29)
Calcium: 9.6 mg/dL (ref 8.6–10.2)
Chloride: 99 mmol/L (ref 96–106)
Creatinine, Ser: 1.39 mg/dL — ABNORMAL HIGH (ref 0.76–1.27)
Globulin, Total: 1.8 g/dL (ref 1.5–4.5)
Glucose: 90 mg/dL (ref 70–99)
Potassium: 4.4 mmol/L (ref 3.5–5.2)
Sodium: 141 mmol/L (ref 134–144)
Total Protein: 6.1 g/dL (ref 6.0–8.5)
eGFR: 54 mL/min/{1.73_m2} — ABNORMAL LOW (ref >59)

## 2022-07-27 ENCOUNTER — Ambulatory Visit (INDEPENDENT_AMBULATORY_CARE_PROVIDER_SITE_OTHER): Payer: Medicare HMO | Admitting: Family Medicine

## 2022-07-27 ENCOUNTER — Encounter: Payer: Self-pay | Admitting: Family Medicine

## 2022-07-27 VITALS — BP 130/81 | HR 86 | Ht 66.0 in | Wt 163.0 lb

## 2022-07-27 DIAGNOSIS — Z0001 Encounter for general adult medical examination with abnormal findings: Secondary | ICD-10-CM

## 2022-07-27 DIAGNOSIS — N183 Chronic kidney disease, stage 3 unspecified: Secondary | ICD-10-CM | POA: Diagnosis not present

## 2022-07-27 DIAGNOSIS — E782 Mixed hyperlipidemia: Secondary | ICD-10-CM

## 2022-07-27 DIAGNOSIS — N1831 Chronic kidney disease, stage 3a: Secondary | ICD-10-CM

## 2022-07-27 DIAGNOSIS — Z0000 Encounter for general adult medical examination without abnormal findings: Secondary | ICD-10-CM

## 2022-07-27 NOTE — Progress Notes (Signed)
BP 130/81   Pulse 86   Ht  (1.676 m)   Wt 163 lb (73.9 kg)   SpO2 96%   BMI 26.31 kg/m    Subjective:   Patient ID: Shawn Cline, male    DOB: 1950-12-27, 72 y.o.   MRN: [id number]  HPI: Shawn Cline is a 72 y.o. male presenting on 07/27/2022 for Medical Management of Chronic Issues and Hyperlipidemia   HPI Physical exam Patient denies any chest pain, shortness of breath, headaches or vision issues, abdominal complaints, diarrhea, nausea, vomiting, or joint issues.  Patient was reached in the hospital for community-acquired pneumonia and respiratory distress, was given steroids to help calm it down and seemed to improve.  He says his cough is gone for the first time in a while.  It sounds like they were concerned that there was chronic inflammation and want to do a longer course of the steroids  Hyperlipidemia Patient is coming in for recheck of his hyperlipidemia. The patient is currently taking simvastatin every other day. They deny any issues with myalgias or history of liver damage from it. They deny any focal numbness or weakness or chest pain.   CKD recheck Patient is coming in today for CKD 3 recheck.  Relevant past medical, surgical, family and social history reviewed and updated as indicated. Interim medical history since our last visit reviewed. Allergies and medications reviewed and updated.  Review of Systems  Constitutional:  Positive for appetite change. Negative for chills and fever.  HENT:  Negative for ear pain and tinnitus.   Eyes:  Negative for pain and visual disturbance.  Respiratory:  Negative for cough, shortness of breath and wheezing.   Cardiovascular:  Negative for chest pain, palpitations and leg swelling.  Gastrointestinal:  Negative for abdominal pain, blood in stool, constipation and diarrhea.  Genitourinary:  Negative for dysuria and hematuria.  Musculoskeletal:  Negative for back pain, gait problem and myalgias.  Skin:  Negative for  rash.  Neurological:  Negative for dizziness, weakness, light-headedness and headaches.  Psychiatric/Behavioral:  Negative for suicidal ideas.   All other systems reviewed and are negative.   Per HPI unless specifically indicated above   Allergies as of 07/27/2022   No Known Allergies      Medication List        Accurate as of July 27, 2022  9:56 AM. If you have any questions, ask your nurse or doctor.          STOP taking these medications    benzonatate 200 MG capsule Commonly known as: TESSALON Stopped by: Nils Pyle, MD   furosemide 20 MG tablet Commonly known as: LASIX Stopped by: Elige Radon Morena Mckissack, MD   HYDROcodone bit-homatropine 5-1.5 MG/5ML syrup Commonly known as: HYCODAN Stopped by: Elige Radon Jaking Thayer, MD       TAKE these medications    albuterol 108 (90 Base) MCG/ACT inhaler Commonly known as: VENTOLIN HFA Inhale 2 puffs into the lungs every 6 (six) hours as needed for wheezing or shortness of breath.   aspirin EC 81 MG tablet Take 81 mg by mouth daily.   cholecalciferol 25 MCG (1000 UT) tablet Generic drug: Cholecalciferol Take 1 tablet (1,000 Units total) by mouth daily.   fluticasone 50 MCG/ACT nasal spray Commonly known as: FLONASE Place 1 spray into both nostrils 2 (two) times daily as needed for allergies or rhinitis.   loratadine 10 MG dissolvable tablet Commonly known as: CLARITIN REDITABS Take by mouth.   metFORMIN 500  MG 24 hr tablet Commonly known as: GLUCOPHAGE-XR Take 500 mg by mouth 2 (two) times daily with a meal.   omeprazole 20 MG capsule Commonly known as: PRILOSEC Take 20 mg by mouth daily.   predniSONE 20 MG tablet Commonly known as: DELTASONE 2 po at same time daily for 5 days   simvastatin 40 MG tablet Commonly known as: ZOCOR Take 1 tablet (40 mg total) by mouth every other day.   sulfamethoxazole-trimethoprim 800-160 MG tablet Commonly known as: BACTRIM DS Take 1 tablet by mouth 3 (three)  times a week. MWF         Objective:   BP 130/81   Pulse 86   Ht 5\' 6"  (1.676 m)   Wt 163 lb (73.9 kg)   SpO2 96%   BMI 26.31 kg/m   Wt Readings from Last 3 Encounters:  07/27/22 163 lb (73.9 kg)  06/25/22 156 lb (70.8 kg)  06/05/22 166 lb 12.8 oz (75.7 kg)    Physical Exam Vitals reviewed.  Constitutional:      General: He is not in acute distress.    Appearance: He is well-developed. He is not diaphoretic.  HENT:     Right Ear: External ear normal.     Left Ear: External ear normal.     Nose: Nose normal.     Mouth/Throat:     Pharynx: No oropharyngeal exudate.  Eyes:     General: No scleral icterus.    Conjunctiva/sclera: Conjunctivae normal.  Neck:     Thyroid: No thyromegaly.  Cardiovascular:     Rate and Rhythm: Normal rate and regular rhythm.     Heart sounds: Normal heart sounds. No murmur heard. Pulmonary:     Effort: Pulmonary effort is normal. No respiratory distress.     Breath sounds: Normal breath sounds. No wheezing.  Abdominal:     General: Bowel sounds are normal. There is no distension.     Palpations: Abdomen is soft.     Tenderness: There is no abdominal tenderness. There is no guarding or rebound.  Musculoskeletal:        General: No swelling. Normal range of motion.     Cervical back: Neck supple.  Lymphadenopathy:     Cervical: No cervical adenopathy.  Skin:    General: Skin is warm and dry.     Findings: No rash.  Neurological:     Mental Status: He is alert and oriented to person, place, and time.     Coordination: Coordination normal.  Psychiatric:        Behavior: Behavior normal.     Results for orders placed or performed in visit on 07/25/22  Lipid panel  Result Value Ref Range   Cholesterol, Total 216 (H) 100 - 199 mg/dL   Triglycerides 292 0 - 149 mg/dL   HDL 74 >44 mg/dL   VLDL Cholesterol Cal 25 5 - 40 mg/dL   LDL Chol Calc (NIH) 628 (H) 0 - 99 mg/dL   Chol/HDL Ratio 2.9 0.0 - 5.0 ratio  CMP14+EGFR  Result Value  Ref Range   Glucose 90 70 - 99 mg/dL   BUN 32 (H) 8 - 27 mg/dL   Creatinine, Ser 6.38 (H) 0.76 - 1.27 mg/dL   eGFR 54 (L) >17 RN/HAF/7.90   BUN/Creatinine Ratio 23 10 - 24   Sodium 141 134 - 144 mmol/L   Potassium 4.4 3.5 - 5.2 mmol/L   Chloride 99 96 - 106 mmol/L   CO2 26 20 - 29  mmol/L   Calcium 9.6 8.6 - 10.2 mg/dL   Total Protein 6.1 6.0 - 8.5 g/dL   Albumin 4.3 3.8 - 4.8 g/dL   Globulin, Total 1.8 1.5 - 4.5 g/dL   Albumin/Globulin Ratio 2.4 (H) 1.2 - 2.2   Bilirubin Total 0.5 0.0 - 1.2 mg/dL   Alkaline Phosphatase 36 (L) 44 - 121 IU/L   AST 16 0 - 40 IU/L   ALT 31 0 - 44 IU/L    Assessment & Plan:   Problem List Items Addressed This Visit       Genitourinary   CKD (chronic kidney disease) stage 3, GFR 30-59 ml/min     Other   Hyperlipidemia   Other Visit Diagnoses     Physical exam    -  Primary       Blood work looks good except for mildly elevated LDL at 117.  His kidney function looks stable.  No changes. Follow up plan: Return in about 6 months (around 01/26/2023), or if symptoms worsen or fail to improve, for Hyperlipidemia recheck.  Counseling provided for all of the vaccine components No orders of the defined types were placed in this encounter.   Arville Care, MD [facility] 07/27/2022, 9:56 AM

## 2022-08-07 DIAGNOSIS — R42 Dizziness and giddiness: Secondary | ICD-10-CM | POA: Diagnosis not present

## 2022-08-07 DIAGNOSIS — C8310 Mantle cell lymphoma, unspecified site: Secondary | ICD-10-CM | POA: Diagnosis not present

## 2022-08-07 DIAGNOSIS — N2581 Secondary hyperparathyroidism of renal origin: Secondary | ICD-10-CM | POA: Diagnosis not present

## 2022-08-07 DIAGNOSIS — N183 Chronic kidney disease, stage 3 unspecified: Secondary | ICD-10-CM | POA: Diagnosis not present

## 2022-08-07 DIAGNOSIS — D631 Anemia in chronic kidney disease: Secondary | ICD-10-CM | POA: Diagnosis not present

## 2022-08-07 DIAGNOSIS — E785 Hyperlipidemia, unspecified: Secondary | ICD-10-CM | POA: Diagnosis not present

## 2022-08-29 ENCOUNTER — Telehealth: Payer: Self-pay | Admitting: Family Medicine

## 2022-08-29 DIAGNOSIS — Z9484 Stem cells transplant status: Secondary | ICD-10-CM | POA: Diagnosis not present

## 2022-08-29 DIAGNOSIS — J8489 Other specified interstitial pulmonary diseases: Secondary | ICD-10-CM | POA: Diagnosis not present

## 2022-08-29 DIAGNOSIS — R0602 Shortness of breath: Secondary | ICD-10-CM | POA: Diagnosis not present

## 2022-08-29 NOTE — Telephone Encounter (Signed)
Contacted Randall Weigold to schedule their annual wellness visit. Appointment made for 02/18/2023.  Thank you,  Judeth Cornfield,  AMB Clinical Support [facility] Direct Dial ??[phone]

## 2022-09-24 ENCOUNTER — Ambulatory Visit (INDEPENDENT_AMBULATORY_CARE_PROVIDER_SITE_OTHER): Payer: Medicare HMO | Admitting: Family Medicine

## 2022-09-24 ENCOUNTER — Other Ambulatory Visit: Payer: Self-pay | Admitting: Family Medicine

## 2022-09-24 ENCOUNTER — Encounter: Payer: Self-pay | Admitting: Family Medicine

## 2022-09-24 ENCOUNTER — Ambulatory Visit: Payer: Medicare HMO

## 2022-09-24 VITALS — BP 125/80 | HR 77 | Ht 66.0 in | Wt 169.0 lb

## 2022-09-24 DIAGNOSIS — R739 Hyperglycemia, unspecified: Secondary | ICD-10-CM | POA: Diagnosis not present

## 2022-09-24 LAB — CMP14+EGFR
ALT: 26 IU/L (ref 0–44)
AST: 16 IU/L (ref 0–40)
Albumin/Globulin Ratio: 2.6
Albumin: 4.2 g/dL (ref 3.8–4.8)
Alkaline Phosphatase: 35 IU/L — ABNORMAL LOW (ref 44–121)
BUN/Creatinine Ratio: 29 — ABNORMAL HIGH (ref 10–24)
BUN: 37 mg/dL — ABNORMAL HIGH (ref 8–27)
Bilirubin Total: 0.4 mg/dL (ref 0.0–1.2)
CO2: 24 mmol/L (ref 20–29)
Calcium: 9.4 mg/dL (ref 8.6–10.2)
Chloride: 100 mmol/L (ref 96–106)
Creatinine, Ser: 1.28 mg/dL — ABNORMAL HIGH (ref 0.76–1.27)
Globulin, Total: 1.6 g/dL (ref 1.5–4.5)
Glucose: 116 mg/dL — ABNORMAL HIGH (ref 70–99)
Potassium: 4 mmol/L (ref 3.5–5.2)
Sodium: 139 mmol/L (ref 134–144)
Total Protein: 5.8 g/dL — ABNORMAL LOW (ref 6.0–8.5)
eGFR: 60 mL/min/{1.73_m2} (ref >59)

## 2022-09-24 LAB — BAYER DCA HB A1C WAIVED: HB A1C (BAYER DCA - WAIVED): 8.3 % — ABNORMAL HIGH (ref 4.8–5.6)

## 2022-09-24 MED ORDER — ACCU-CHEK AVIVA PLUS VI STRP: 1.0000 | ORAL_STRIP | Freq: Three times a day (TID) | 3 refills | Status: DC

## 2022-09-24 MED ORDER — ACCU-CHEK FASTCLIX LANCET KIT: 1.0000 | PACK | Freq: Three times a day (TID) | 1 refills | Status: DC

## 2022-09-24 MED ORDER — ACCU-CHEK AVIVA PLUS W/DEVICE KIT: 1.0000 | PACK | Freq: Three times a day (TID) | 0 refills | Status: DC

## 2022-09-24 NOTE — Progress Notes (Signed)
BP 125/80   Pulse 77   Ht 5\' 6"  (1.676 m)   Wt 169 lb (76.7 kg)   SpO2 97%   BMI 27.28 kg/m    Subjective:   Patient ID: Shawn Cline, male    DOB: [birth year], 72 y.o.   MRN: [id number]  HPI: Shawn Cline is a 72 y.o. male presenting on 09/24/2022 for Hyperglycemia   HPI Elevated blood sugar Patient has been on prednisone for his mantle cell disease and his blood sugars have been running very high since has been on it.  It has been in the 100s in the mornings within normal go up over 3 or 400.  He is on the taper for couple months on the prednisone.  He does say he has been more hungry since he has been on it.  His blood sugar which will creep up throughout the days.  He is just been checking it over the past week or 2.  Relevant past medical, surgical, family and social history reviewed and updated as indicated. Interim medical history since our last visit reviewed. Allergies and medications reviewed and updated.  Review of Systems  Constitutional:  Positive for fatigue. Negative for chills and fever.  Eyes:  Negative for visual disturbance.  Respiratory:  Negative for shortness of breath and wheezing.   Cardiovascular:  Negative for chest pain and leg swelling.  Skin:  Negative for rash.  Neurological:  Negative for dizziness, weakness and light-headedness.  All other systems reviewed and are negative.   Per HPI unless specifically indicated above   Allergies as of 09/24/2022   No Known Allergies      Medication List        Accurate as of September 24, 2022  8:23 AM. If you have any questions, ask your nurse or doctor.          Accu-Chek Aviva Plus test strip Generic drug: glucose blood 1 each by Other route 3 (three) times daily. Use as instructed Started by: Elige Radon Munachimso Palin, MD   Accu-Chek Aviva Plus w/Device Kit 1 each by Does not apply route 3 (three) times daily. Started by: Nils Pyle, MD   Accu-Chek FastClix Lancet Kit 1 each by Does  not apply route 3 (three) times daily. Started by: Nils Pyle, MD   albuterol 108 (90 Base) MCG/ACT inhaler Commonly known as: VENTOLIN HFA Inhale 2 puffs into the lungs every 6 (six) hours as needed for wheezing or shortness of breath.   aspirin EC 81 MG tablet Take 81 mg by mouth daily.   cholecalciferol 25 MCG (1000 UNIT) tablet Commonly known as: VITAMIN D3 Take 1 tablet (1,000 Units total) by mouth daily.   fluticasone 50 MCG/ACT nasal spray Commonly known as: FLONASE Place 1 spray into both nostrils 2 (two) times daily as needed for allergies or rhinitis.   loratadine 10 MG dissolvable tablet Commonly known as: CLARITIN REDITABS Take by mouth.   metFORMIN 500 MG 24 hr tablet Commonly known as: GLUCOPHAGE-XR Take 500 mg by mouth 2 (two) times daily with a meal.   omeprazole 20 MG capsule Commonly known as: PRILOSEC Take 20 mg by mouth daily.   predniSONE 20 MG tablet Commonly known as: DELTASONE 2 po at same time daily for 5 days   simvastatin 40 MG tablet Commonly known as: ZOCOR Take 1 tablet (40 mg total) by mouth every other day.   sulfamethoxazole-trimethoprim 800-160 MG tablet Commonly known as: BACTRIM DS Take 1 tablet by mouth 3 (  three) times a week. MWF         Objective:   BP 125/80   Pulse 77   Ht 5\' 6"  (1.676 m)   Wt 169 lb (76.7 kg)   SpO2 97%   BMI 27.28 kg/m   Wt Readings from Last 3 Encounters:  09/24/22 169 lb (76.7 kg)  07/27/22 163 lb (73.9 kg)  06/25/22 156 lb (70.8 kg)    Physical Exam Vitals and nursing note reviewed.  Constitutional:      General: He is not in acute distress.    Appearance: He is well-developed. He is not diaphoretic.  Eyes:     General: No scleral icterus.    Conjunctiva/sclera: Conjunctivae normal.  Neck:     Thyroid: No thyromegaly.  Musculoskeletal:        General: Normal range of motion.     Cervical back: Neck supple.  Lymphadenopathy:     Cervical: No cervical adenopathy.  Skin:     General: Skin is warm and dry.  Neurological:     Mental Status: He is alert and oriented to person, place, and time.       Assessment & Plan:   Problem List Items Addressed This Visit   None Visit Diagnoses     Elevated blood sugar    -  Primary   Relevant Medications   Blood Glucose Monitoring Suppl (ACCU-CHEK AVIVA PLUS) w/Device KIT   glucose blood (ACCU-CHEK AVIVA PLUS) test strip   Lancets Misc. (ACCU-CHEK FASTCLIX LANCET) KIT   Other Relevant Orders   Bayer DCA Hb A1c Waived   CMP14+EGFR       Gave sample for Ozempic at 0.25 weekly and sent prescription for glucometer. Follow up plan: Return if symptoms worsen or fail to improve.  Counseling provided for all of the vaccine components Orders Placed This Encounter  Procedures   Bayer [employer] Hb A1c Waived   CMP14+EGFR    Arville Care, MD [facility] 09/24/2022, 8:23 AM

## 2022-09-25 ENCOUNTER — Other Ambulatory Visit: Payer: Self-pay | Admitting: Family Medicine

## 2022-09-25 ENCOUNTER — Encounter: Payer: Self-pay | Admitting: Family Medicine

## 2022-09-25 DIAGNOSIS — R739 Hyperglycemia, unspecified: Secondary | ICD-10-CM

## 2022-09-26 DIAGNOSIS — R942 Abnormal results of pulmonary function studies: Secondary | ICD-10-CM | POA: Diagnosis not present

## 2022-09-26 DIAGNOSIS — R0602 Shortness of breath: Secondary | ICD-10-CM | POA: Diagnosis not present

## 2022-09-30 DIAGNOSIS — Z87891 Personal history of nicotine dependence: Secondary | ICD-10-CM | POA: Diagnosis not present

## 2022-09-30 DIAGNOSIS — J984 Other disorders of lung: Secondary | ICD-10-CM | POA: Diagnosis not present

## 2022-10-03 ENCOUNTER — Other Ambulatory Visit: Payer: Self-pay | Admitting: Family Medicine

## 2022-10-03 DIAGNOSIS — E782 Mixed hyperlipidemia: Secondary | ICD-10-CM

## 2022-10-19 ENCOUNTER — Telehealth: Payer: Self-pay

## 2022-10-19 NOTE — Telephone Encounter (Signed)
Pt dropped off letter with blood sugar readings along with his Prednisone regimen.  Pt wanted to know if Dr. Louanne Skye could manage the Prednisone or should the provider in [facility].  Per Dr. Louanne Skye pt should keep the physician in [facility] involved and that they should manage. Dettinger also advised that blood sugars should also improve with a lower dose of the steroids.  Pt made aware of all and letter sent to be scanned.

## 2022-11-28 ENCOUNTER — Encounter: Payer: Self-pay | Admitting: Family Medicine

## 2022-12-05 DIAGNOSIS — J9811 Atelectasis: Secondary | ICD-10-CM | POA: Diagnosis not present

## 2022-12-05 DIAGNOSIS — J438 Other emphysema: Secondary | ICD-10-CM | POA: Diagnosis not present

## 2022-12-05 DIAGNOSIS — R918 Other nonspecific abnormal finding of lung field: Secondary | ICD-10-CM | POA: Diagnosis not present

## 2022-12-05 DIAGNOSIS — C8310 Mantle cell lymphoma, unspecified site: Secondary | ICD-10-CM | POA: Diagnosis not present

## 2022-12-18 ENCOUNTER — Other Ambulatory Visit: Payer: Self-pay | Admitting: Family Medicine

## 2022-12-25 DIAGNOSIS — Z8616 Personal history of COVID-19: Secondary | ICD-10-CM | POA: Diagnosis not present

## 2022-12-25 DIAGNOSIS — Z87891 Personal history of nicotine dependence: Secondary | ICD-10-CM | POA: Diagnosis not present

## 2022-12-25 DIAGNOSIS — R030 Elevated blood-pressure reading, without diagnosis of hypertension: Secondary | ICD-10-CM | POA: Diagnosis not present

## 2022-12-25 DIAGNOSIS — E1122 Type 2 diabetes mellitus with diabetic chronic kidney disease: Secondary | ICD-10-CM | POA: Diagnosis not present

## 2022-12-25 DIAGNOSIS — Z7982 Long term (current) use of aspirin: Secondary | ICD-10-CM | POA: Diagnosis not present

## 2022-12-25 DIAGNOSIS — J449 Chronic obstructive pulmonary disease, unspecified: Secondary | ICD-10-CM | POA: Diagnosis not present

## 2022-12-25 DIAGNOSIS — E785 Hyperlipidemia, unspecified: Secondary | ICD-10-CM | POA: Diagnosis not present

## 2022-12-25 DIAGNOSIS — Z809 Family history of malignant neoplasm, unspecified: Secondary | ICD-10-CM | POA: Diagnosis not present

## 2022-12-25 DIAGNOSIS — D849 Immunodeficiency, unspecified: Secondary | ICD-10-CM | POA: Diagnosis not present

## 2022-12-25 DIAGNOSIS — K219 Gastro-esophageal reflux disease without esophagitis: Secondary | ICD-10-CM | POA: Diagnosis not present

## 2022-12-25 DIAGNOSIS — Z7984 Long term (current) use of oral hypoglycemic drugs: Secondary | ICD-10-CM | POA: Diagnosis not present

## 2022-12-25 DIAGNOSIS — N529 Male erectile dysfunction, unspecified: Secondary | ICD-10-CM | POA: Diagnosis not present

## 2022-12-27 ENCOUNTER — Ambulatory Visit: Payer: Medicare HMO | Admitting: Podiatry

## 2022-12-27 ENCOUNTER — Encounter: Payer: Self-pay | Admitting: Podiatry

## 2022-12-27 DIAGNOSIS — M79674 Pain in right toe(s): Secondary | ICD-10-CM

## 2022-12-27 DIAGNOSIS — L600 Ingrowing nail: Secondary | ICD-10-CM

## 2022-12-27 DIAGNOSIS — M79675 Pain in left toe(s): Secondary | ICD-10-CM

## 2022-12-27 NOTE — Patient Instructions (Signed)

## 2022-12-27 NOTE — Progress Notes (Signed)
Subjective:   Patient ID: Shawn Cline, male   DOB: 72 y.o.   MRN: [id number]   HPI Chief Complaint  Patient presents with   Ingrown Toenail    Rm 11: patient is here for ingrown toe nails   The patient, with a history of possible diabetes and neuropathy secondary to chemotherapy, presents with bilateral ingrown toenails on the medial aspect of both big toes. He has been self-managing this issue for approximately 20 years by trimming the nails themselves. However, due to increasing age and decreased flexibility, he is no longer able to do so. The ingrown toenails cause discomfort when wearing shoes and can be painful enough to wake the patient from sleep if they are touched. There is no reported discharge or signs of infection. The patient also has a history of pneumonia, for which he was hospitalized and treated with prednisone. This treatment may have affected his blood sugar levels, as his most recent A1c was 8.3, up from 6.4 prior to the prednisone treatment.   Review of Systems  All other systems reviewed and are negative.  Past Medical History:  Diagnosis Date   Hypercholesteremia     Past Surgical History:  Procedure Laterality Date   HEMORRHOID SURGERY       Current Outpatient Medications:    albuterol (VENTOLIN HFA) 108 (90 Base) MCG/ACT inhaler, Inhale 2 puffs into the lungs every 6 (six) hours as needed for wheezing or shortness of breath., Disp: 1 each, Rfl: 11   aspirin EC 81 MG tablet, Take 81 mg by mouth daily., Disp: , Rfl:    Blood Glucose Monitoring Suppl (ONETOUCH VERIO FLEX SYSTEM) w/Device KIT, Test BS TID R73.9, Disp: 1 kit, Rfl: 0   cholecalciferol (VITAMIN D) 1000 units tablet, Take 1 tablet (1,000 Units total) by mouth daily., Disp: 90 tablet, Rfl: 3   fluticasone (FLONASE) 50 MCG/ACT nasal spray, Place 1 spray into both nostrils 2 (two) times daily as needed for allergies or rhinitis., Disp: 16 g, Rfl: 6   glucose blood (ONETOUCH VERIO) test strip, Test BS  TID R73.9, Disp: 300 each, Rfl: 3   Lancets (ONETOUCH DELICA PLUS LANCET33G) MISC, Test BS TID R73.9, Disp: 300 each, Rfl: 3   loratadine (CLARITIN REDITABS) 10 MG dissolvable tablet, Take by mouth., Disp: , Rfl:    metFORMIN (GLUCOPHAGE-XR) 500 MG 24 hr tablet, Take 1 tablet (500 mg total) by mouth daily with breakfast., Disp: 90 tablet, Rfl: 1   omeprazole (PRILOSEC) 20 MG capsule, Take 20 mg by mouth daily., Disp: , Rfl:    predniSONE (DELTASONE) 20 MG tablet, 2 po at same time daily for 5 days, Disp: 10 tablet, Rfl: 0   simvastatin (ZOCOR) 40 MG tablet, TAKE 1 TABLET BY MOUTH EVERY OTHER DAY, Disp: 45 tablet, Rfl: 1   sulfamethoxazole-trimethoprim (BACTRIM DS) 800-160 MG tablet, Take 1 tablet by mouth 3 (three) times a week. MWF, Disp: , Rfl:   No Known Allergies         Objective:  Physical Exam  General: AAO x3, NAD  Dermatological: Incurvation present medial aspect bilateral hallux toenails without any drainage or pus.  Localized edema there is no erythema or warmth.  No open lesions.  Vascular: Dorsalis Pedis artery and Posterior Tibial artery pedal pulses are 2/4 bilateral with immedate capillary fill time There is no pain with calf compression, swelling, warmth, erythema.   Neruologic: Grossly intact via light touch bilateral  Musculoskeletal: Tenderness on the medial nail borders of the hallux but no  other areas of discomfort.      Assessment:   Chronic ingrown toenails     Plan:  Ingrown Toenails Chronic issue with both big toes, causing discomfort especially when wearing shoes. Patient has been self-managing with trimming. No signs of infection. Discussed options of continued trimming vs surgical removal. Patient opted for continued trimming. -Trimmed both toenails in office. -Advised to soak feet in Epsom salts for the next few days. -Advised to monitor for signs of infection and to return if symptoms worsen or do not improve.  Diabetes Recent elevated A1c  (8.3) likely due to prednisone use for pneumonia. Prior A1c was 6.4. 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 -Continue monitoring blood glucose levels and managing diabetes as previously instructed.  General Health Maintenance -Continue wearing wide shoes to avoid pressure on toes. -Return for regular toenail trimming as needed, approximately every 3-4 months.

## 2023-01-02 DIAGNOSIS — R0982 Postnasal drip: Secondary | ICD-10-CM | POA: Diagnosis not present

## 2023-01-02 DIAGNOSIS — R0602 Shortness of breath: Secondary | ICD-10-CM | POA: Diagnosis not present

## 2023-01-02 DIAGNOSIS — J8489 Other specified interstitial pulmonary diseases: Secondary | ICD-10-CM | POA: Diagnosis not present

## 2023-01-02 DIAGNOSIS — Z9484 Stem cells transplant status: Secondary | ICD-10-CM | POA: Diagnosis not present

## 2023-01-02 DIAGNOSIS — J309 Allergic rhinitis, unspecified: Secondary | ICD-10-CM | POA: Diagnosis not present

## 2023-01-02 DIAGNOSIS — J302 Other seasonal allergic rhinitis: Secondary | ICD-10-CM | POA: Diagnosis not present

## 2023-01-22 ENCOUNTER — Telehealth: Payer: Self-pay | Admitting: Family Medicine

## 2023-01-22 DIAGNOSIS — R739 Hyperglycemia, unspecified: Secondary | ICD-10-CM

## 2023-01-22 DIAGNOSIS — N1831 Chronic kidney disease, stage 3a: Secondary | ICD-10-CM

## 2023-01-22 DIAGNOSIS — E782 Mixed hyperlipidemia: Secondary | ICD-10-CM

## 2023-01-22 NOTE — Telephone Encounter (Signed)
Future lab orders placed

## 2023-01-24 ENCOUNTER — Other Ambulatory Visit: Payer: Medicare HMO

## 2023-01-24 DIAGNOSIS — E782 Mixed hyperlipidemia: Secondary | ICD-10-CM

## 2023-01-24 DIAGNOSIS — N1831 Chronic kidney disease, stage 3a: Secondary | ICD-10-CM

## 2023-01-24 DIAGNOSIS — R739 Hyperglycemia, unspecified: Secondary | ICD-10-CM

## 2023-01-24 LAB — BAYER DCA HB A1C WAIVED: HB A1C (BAYER DCA - WAIVED): 5.7 % — ABNORMAL HIGH (ref 4.8–5.6)

## 2023-01-25 LAB — LIPID PANEL
Chol/HDL Ratio: 3.4 {ratio} (ref 0.0–5.0)
Cholesterol, Total: 165 mg/dL (ref 100–199)
HDL: 49 mg/dL (ref >39)
LDL Chol Calc (NIH): 97 mg/dL (ref 0–99)
Triglycerides: 106 mg/dL (ref 0–149)
VLDL Cholesterol Cal: 19 mg/dL (ref 5–40)

## 2023-01-25 LAB — CBC WITH DIFFERENTIAL/PLATELET
Basophils Absolute: 0 10*3/uL (ref 0.0–0.2)
Basos: 1 %
EOS (ABSOLUTE): 0.1 10*3/uL (ref 0.0–0.4)
Eos: 3 %
Hematocrit: 38.4 % (ref 37.5–51.0)
Hemoglobin: 12.5 g/dL — ABNORMAL LOW (ref 13.0–17.7)
Immature Grans (Abs): 0 10*3/uL (ref 0.0–0.1)
Immature Granulocytes: 0 %
Lymphocytes Absolute: 0.7 10*3/uL (ref 0.7–3.1)
Lymphs: 20 %
MCH: 30.9 pg (ref 26.6–33.0)
MCHC: 32.6 g/dL (ref 31.5–35.7)
MCV: 95 fL (ref 79–97)
Monocytes Absolute: 0.6 10*3/uL (ref 0.1–0.9)
Monocytes: 17 %
Neutrophils Absolute: 2.1 10*3/uL (ref 1.4–7.0)
Neutrophils: 59 %
Platelets: 178 10*3/uL (ref 150–450)
RBC: 4.04 x10E6/uL — ABNORMAL LOW (ref 4.14–5.80)
RDW: 12.2 % (ref 11.6–15.4)
WBC: 3.5 10*3/uL (ref 3.4–10.8)

## 2023-01-25 LAB — CMP14+EGFR
ALT: 13 [IU]/L (ref 0–44)
AST: 17 [IU]/L (ref 0–40)
Albumin: 4.2 g/dL (ref 3.8–4.8)
Alkaline Phosphatase: 46 [IU]/L (ref 44–121)
BUN/Creatinine Ratio: 12 (ref 10–24)
BUN: 18 mg/dL (ref 8–27)
Bilirubin Total: 0.4 mg/dL (ref 0.0–1.2)
CO2: 23 mmol/L (ref 20–29)
Calcium: 9.4 mg/dL (ref 8.6–10.2)
Chloride: 104 mmol/L (ref 96–106)
Creatinine, Ser: 1.49 mg/dL — ABNORMAL HIGH (ref 0.76–1.27)
Globulin, Total: 1.7 g/dL (ref 1.5–4.5)
Glucose: 103 mg/dL — ABNORMAL HIGH (ref 70–99)
Potassium: 4.3 mmol/L (ref 3.5–5.2)
Sodium: 143 mmol/L (ref 134–144)
Total Protein: 5.9 g/dL — ABNORMAL LOW (ref 6.0–8.5)
eGFR: 50 mL/min/{1.73_m2} — ABNORMAL LOW (ref >59)

## 2023-01-28 ENCOUNTER — Encounter: Payer: Self-pay | Admitting: Family Medicine

## 2023-01-28 ENCOUNTER — Ambulatory Visit (INDEPENDENT_AMBULATORY_CARE_PROVIDER_SITE_OTHER): Payer: Medicare HMO | Admitting: Family Medicine

## 2023-01-28 VITALS — BP 125/77 | HR 69 | Ht 66.0 in | Wt 178.0 lb

## 2023-01-28 DIAGNOSIS — E1169 Type 2 diabetes mellitus with other specified complication: Secondary | ICD-10-CM | POA: Insufficient documentation

## 2023-01-28 DIAGNOSIS — N1831 Chronic kidney disease, stage 3a: Secondary | ICD-10-CM

## 2023-01-28 DIAGNOSIS — Z23 Encounter for immunization: Secondary | ICD-10-CM

## 2023-01-28 DIAGNOSIS — E1122 Type 2 diabetes mellitus with diabetic chronic kidney disease: Secondary | ICD-10-CM

## 2023-01-28 DIAGNOSIS — E782 Mixed hyperlipidemia: Secondary | ICD-10-CM

## 2023-01-28 NOTE — Progress Notes (Signed)
BP 125/77   Pulse 69   Ht 5\' 6"  (1.676 m)   Wt 178 lb (80.7 kg)   SpO2 99%   BMI 28.73 kg/m    Subjective:   Patient ID: Shawn Cline, male    DOB: 1950-05-10, 72 y.o.   MRN: [id number]  HPI: Shawn Cline is a 72 y.o. male presenting on 01/28/2023 for Medical Management of Chronic Issues and Hyperlipidemia   HPI Hyperlipidemia Patient is coming in for recheck of his hyperlipidemia. The patient is currently taking simvastatin. They deny any issues with myalgias or history of liver damage from it. They deny any focal numbness or weakness or chest pain.   CKD recheck Patient is coming in today for CKD recheck.  He denies any urinary issues.  He currently does not take any medicine for it.  Type 2 diabetes mellitus Patient comes in today for recheck of his diabetes. Patient has been currently taking Metformin. Patient is not currently on an ACE inhibitor/ARB. Patient has not seen an ophthalmologist this year. Patient denies any new issues with their feet. The symptom started onset as an adult hyperlipidemia and CKD ARE RELATED TO DM   Relevant past medical, surgical, family and social history reviewed and updated as indicated. Interim medical history since our last visit reviewed. Allergies and medications reviewed and updated.  Review of Systems  Constitutional:  Negative for chills and fever.  Eyes:  Negative for visual disturbance.  Respiratory:  Negative for shortness of breath and wheezing.   Cardiovascular:  Negative for chest pain and leg swelling.  Musculoskeletal:  Positive for back pain (He gets the occasional back pain sometimes, he is still working at a job where he lifts and pushes and pulls a lot of things). Negative for arthralgias and gait problem.  Skin:  Negative for rash.  All other systems reviewed and are negative.   Per HPI unless specifically indicated above   Allergies as of 01/28/2023   No Known Allergies      Medication List         Accurate as of January 28, 2023  8:23 AM. If you have any questions, ask your nurse or doctor.          STOP taking these medications    metFORMIN 500 MG 24 hr tablet Commonly known as: GLUCOPHAGE-XR Stopped by: Elige Radon Gared Gillie   predniSONE 20 MG tablet Commonly known as: DELTASONE Stopped by: Elige Radon Jahnavi Muratore   sulfamethoxazole-trimethoprim 800-160 MG tablet Commonly known as: BACTRIM DS Stopped by: Elige Radon Myrtle Barnhard       TAKE these medications    albuterol 108 (90 Base) MCG/ACT inhaler Commonly known as: VENTOLIN HFA Inhale 2 puffs into the lungs every 6 (six) hours as needed for wheezing or shortness of breath.   aspirin EC 81 MG tablet Take 81 mg by mouth daily.   cholecalciferol 25 MCG (1000 UNIT) tablet Commonly known as: VITAMIN D3 Take 1 tablet (1,000 Units total) by mouth daily.   fluticasone 50 MCG/ACT nasal spray Commonly known as: FLONASE Place 1 spray into both nostrils 2 (two) times daily as needed for allergies or rhinitis.   loratadine 10 MG dissolvable tablet Commonly known as: CLARITIN REDITABS Take by mouth.   omeprazole 20 MG capsule Commonly known as: PRILOSEC Take 20 mg by mouth daily.   OneTouch Delica Plus Lancet33G Misc Test BS TID R73.9   OneTouch Verio Flex System w/Device Kit Test BS TID R73.9   OneTouch Verio test strip Generic  drug: glucose blood Test BS TID R73.9   simvastatin 40 MG tablet Commonly known as: ZOCOR TAKE 1 TABLET BY MOUTH EVERY OTHER DAY         Objective:   BP 125/77   Pulse 69   Ht 5\' 6"  (1.676 m)   Wt 178 lb (80.7 kg)   SpO2 99%   BMI 28.73 kg/m   Wt Readings from Last 3 Encounters:  01/28/23 178 lb (80.7 kg)  09/24/22 169 lb (76.7 kg)  07/27/22 163 lb (73.9 kg)    Physical Exam Vitals and nursing note reviewed.  Constitutional:      General: He is not in acute distress.    Appearance: He is well-developed. He is not diaphoretic.  Eyes:     General: No scleral icterus.     Conjunctiva/sclera: Conjunctivae normal.  Neck:     Thyroid: No thyromegaly.  Cardiovascular:     Rate and Rhythm: Normal rate and regular rhythm.     Heart sounds: Normal heart sounds. No murmur heard. Pulmonary:     Effort: Pulmonary effort is normal. No respiratory distress.     Breath sounds: Normal breath sounds. No wheezing.  Musculoskeletal:        General: No swelling. Normal range of motion.     Cervical back: Neck supple.  Lymphadenopathy:     Cervical: No cervical adenopathy.  Skin:    General: Skin is warm and dry.     Findings: No rash.  Neurological:     Mental Status: He is alert and oriented to person, place, and time.     Coordination: Coordination normal.  Psychiatric:        Behavior: Behavior normal.     Results for orders placed or performed in visit on 01/24/23  CBC with Differential/Platelet  Result Value Ref Range   WBC 3.5 3.4 - 10.8 x10E3/uL   RBC 4.04 (L) 4.14 - 5.80 x10E6/uL   Hemoglobin 12.5 (L) 13.0 - 17.7 g/dL   Hematocrit 40.1 02.7 - 51.0 %   MCV 95 79 - 97 fL   MCH 30.9 26.6 - 33.0 pg   MCHC 32.6 31.5 - 35.7 g/dL   RDW 25.3 66.4 - 40.3 %   Platelets 178 150 - 450 x10E3/uL   Neutrophils 59 Not Estab. %   Lymphs 20 Not Estab. %   Monocytes 17 Not Estab. %   Eos 3 Not Estab. %   Basos 1 Not Estab. %   Neutrophils Absolute 2.1 1.4 - 7.0 x10E3/uL   Lymphocytes Absolute 0.7 0.7 - 3.1 x10E3/uL   Monocytes Absolute 0.6 0.1 - 0.9 x10E3/uL   EOS (ABSOLUTE) 0.1 0.0 - 0.4 x10E3/uL   Basophils Absolute 0.0 0.0 - 0.2 x10E3/uL   Immature Granulocytes 0 Not Estab. %   Immature Grans (Abs) 0.0 0.0 - 0.1 x10E3/uL  Bayer DCA Hb A1c Waived  Result Value Ref Range   HB A1C (BAYER DCA - WAIVED) 5.7 (H) 4.8 - 5.6 %  Lipid panel  Result Value Ref Range   Cholesterol, Total 165 100 - 199 mg/dL   Triglycerides 474 0 - 149 mg/dL   HDL 49 >25 mg/dL   VLDL Cholesterol Cal 19 5 - 40 mg/dL   LDL Chol Calc (NIH) 97 0 - 99 mg/dL   Chol/HDL Ratio 3.4 0.0 -  5.0 ratio  CMP14+EGFR  Result Value Ref Range   Glucose 103 (H) 70 - 99 mg/dL   BUN 18 8 - 27 mg/dL   Creatinine,  Ser 1.49 (H) 0.76 - 1.27 mg/dL   eGFR 50 (L) >16 XW/RUE/4.54   BUN/Creatinine Ratio 12 10 - 24   Sodium 143 134 - 144 mmol/L   Potassium 4.3 3.5 - 5.2 mmol/L   Chloride 104 96 - 106 mmol/L   CO2 23 20 - 29 mmol/L   Calcium 9.4 8.6 - 10.2 mg/dL   Total Protein 5.9 (L) 6.0 - 8.5 g/dL   Albumin 4.2 3.8 - 4.8 g/dL   Globulin, Total 1.7 1.5 - 4.5 g/dL   Bilirubin Total 0.4 0.0 - 1.2 mg/dL   Alkaline Phosphatase 46 44 - 121 IU/L   AST 17 0 - 40 IU/L   ALT 13 0 - 44 IU/L    Assessment & Plan:   Problem List Items Addressed This Visit       Endocrine   Type 2 diabetes mellitus with other specified complication (HCC)     Genitourinary   CKD (chronic kidney disease) stage 3, GFR 30-59 ml/min (HCC)     Other   Hyperlipidemia - Primary    For his back uses Tylenol and the occasional Voltaren gel and that is working fine to control it and recommended that he stretch prior to lifting pushing or pulling things.  A1c looks a lot better, go ahead and stop the metformin Follow up plan: Return in about 3 months (around 04/30/2023), or if symptoms worsen or fail to improve, for Diabetes recheck an A1c only.  Counseling provided for all of the vaccine components No orders of the defined types were placed in this encounter.   Arville Care, MD [facility] 01/28/2023, 8:23 AM

## 2023-02-06 DIAGNOSIS — N2581 Secondary hyperparathyroidism of renal origin: Secondary | ICD-10-CM | POA: Diagnosis not present

## 2023-02-06 DIAGNOSIS — D631 Anemia in chronic kidney disease: Secondary | ICD-10-CM | POA: Diagnosis not present

## 2023-02-06 DIAGNOSIS — E785 Hyperlipidemia, unspecified: Secondary | ICD-10-CM | POA: Diagnosis not present

## 2023-02-06 DIAGNOSIS — C8310 Mantle cell lymphoma, unspecified site: Secondary | ICD-10-CM | POA: Diagnosis not present

## 2023-02-06 DIAGNOSIS — N183 Chronic kidney disease, stage 3 unspecified: Secondary | ICD-10-CM | POA: Diagnosis not present

## 2023-02-06 DIAGNOSIS — R42 Dizziness and giddiness: Secondary | ICD-10-CM | POA: Diagnosis not present

## 2023-02-18 ENCOUNTER — Ambulatory Visit: Payer: Medicare HMO

## 2023-02-18 VITALS — Ht 66.0 in | Wt 174.0 lb

## 2023-02-18 DIAGNOSIS — Z0000 Encounter for general adult medical examination without abnormal findings: Secondary | ICD-10-CM | POA: Diagnosis not present

## 2023-02-18 NOTE — Progress Notes (Signed)
Subjective:   Shawn Cline is a 72 y.o. male who presents for Medicare Annual/Subsequent preventive examination.  Visit Complete: Virtual I connected with  Shawn Cline on 02/18/23 by a audio enabled telemedicine application and verified that I am speaking with the correct person using two identifiers.  Patient Location: Home  Provider Location: Home Office  I discussed the limitations of evaluation and management by telemedicine. The patient expressed understanding and agreed to proceed.  Vital Signs: Because this visit was a virtual/telehealth visit, some criteria may be missing or patient reported. Any vitals not documented were not able to be obtained and vitals that have been documented are patient reported.  Patient Medicare AWV questionnaire was completed by the patient on 02/18/2023; I have confirmed that all information answered by patient is correct and no changes since this date.  Cardiac Risk Factors include: advanced age (>71men, >53 women);male gender;hypertension;dyslipidemia     Objective:    Today's Vitals   02/18/23 0803  Weight: 174 lb (78.9 kg)  Height: 5\' 6"  (1.676 m)   Body mass index is 28.08 kg/m.     02/18/2023    8:07 AM 02/16/2022    8:20 AM 02/15/2021    8:27 AM 06/13/2018    1:54 PM 06/05/2018    7:42 AM 05/23/2018    9:48 AM  Advanced Directives  Does Patient Have a Medical Advance Directive? Yes Yes Yes No No No  Type of Estate agent of [place];Living will Healthcare Power of [place];Living will Healthcare Power of [place];Living will     Copy of Healthcare Power of Attorney in Chart? No - copy requested No - copy requested No - copy requested     Would patient like information on creating a medical advance directive?    No - Patient declined No - Patient declined No - Patient declined    Current Medications (verified) Outpatient Encounter Medications as of 02/18/2023  Medication Sig   aspirin EC 81 MG tablet Take  81 mg by mouth daily.   Blood Glucose Monitoring Suppl (ONETOUCH VERIO FLEX SYSTEM) w/Device KIT Test BS TID R73.9   cholecalciferol (VITAMIN D) 1000 units tablet Take 1 tablet (1,000 Units total) by mouth daily.   fluticasone (FLONASE) 50 MCG/ACT nasal spray Place 1 spray into both nostrils 2 (two) times daily as needed for allergies or rhinitis.   glucose blood (ONETOUCH VERIO) test strip Test BS TID R73.9   Lancets (ONETOUCH DELICA PLUS LANCET33G) MISC Test BS TID R73.9   loratadine (CLARITIN REDITABS) 10 MG dissolvable tablet Take by mouth.   omeprazole (PRILOSEC) 20 MG capsule Take 20 mg by mouth daily.   simvastatin (ZOCOR) 40 MG tablet TAKE 1 TABLET BY MOUTH EVERY OTHER DAY   albuterol (VENTOLIN HFA) 108 (90 Base) MCG/ACT inhaler Inhale 2 puffs into the lungs every 6 (six) hours as needed for wheezing or shortness of breath.   No facility-administered encounter medications on file as of 02/18/2023.    Allergies (verified) Patient has no known allergies.   History: Past Medical History:  Diagnosis Date   Cancer [facility]) March 2020   Autologous Stem cell transplant - 10/20   Chronic kidney disease November 2019   Hypercholesteremia    Past Surgical History:  Procedure Laterality Date   HEMORRHOID SURGERY     Family History  Problem Relation Age of Onset   Atrial fibrillation Mother    Dementia Mother    Hearing loss Mother    Cancer Father  lung   COPD Father    Social History   Socioeconomic History   Marital status: Married    Spouse name: Not on file   Number of children: 1   Years of education: 13   Highest education level: Associate degree: occupational, Scientist, product/process development, or vocational program  Occupational History   Occupation: retired  Tobacco Use   Smoking status: Former    Current packs/day: 0.00    Average packs/day: 1 pack/day for 24.0 years (24.0 ttl pk-yrs)    Types: Cigarettes    Quit date: 12/03/1991    Years since quitting: 31.2   Smokeless  tobacco: Never  Vaping Use   Vaping status: Never Used  Substance and Sexual Activity   Alcohol use: No   Drug use: No   Sexual activity: Not Currently  Other Topics Concern   Not on file  Social History Narrative   Not on file   Social Determinants of Health   Financial Resource Strain: Low Risk  (02/18/2023)   Overall Financial Resource Strain (CARDIA)    Difficulty of Paying Living Expenses: Not hard at all  Food Insecurity: No Food Insecurity (02/18/2023)   Hunger Vital Sign    Worried About Running Out of Food in the Last Year: Never true    Ran Out of Food in the Last Year: Never true  Transportation Needs: No Transportation Needs (02/18/2023)   PRAPARE - Administrator, Civil Service (Medical): No    Lack of Transportation (Non-Medical): No  Physical Activity: Insufficiently Active (02/18/2023)   Exercise Vital Sign    Days of Exercise per Week: 3 days    Minutes of Exercise per Session: 30 min  Stress: No Stress Concern Present (02/18/2023)   [employer] of Occupational Health - Occupational Stress Questionnaire    Feeling of Stress : Not at all  Social Connections: Socially Integrated (02/18/2023)   Social Connection and Isolation Panel [NHANES]    Frequency of Communication with Friends and Family: More than three times a week    Frequency of Social Gatherings with Friends and Family: More than three times a week    Attends Religious Services: More than 4 times per year    Active Member of [employer] or Organizations: Yes    Attends Engineer, structural: More than 4 times per year    Marital Status: Married    Tobacco Counseling Counseling given: Not Answered   Clinical Intake:  Pre-visit preparation completed: Yes  Pain : No/denies pain     Nutritional Risks: None Diabetes: Yes CBG done?: No Did pt. bring in CBG monitor from home?: No  How often do you need to have someone help you when you read instructions, pamphlets, or other  written materials from your doctor or pharmacy?: 1 - Never  Interpreter Needed?: No  Information entered by :: Renie Ora, LPN   Activities of Daily Living    02/18/2023    8:08 AM 02/14/2023   11:22 PM  In your present state of health, do you have any difficulty performing the following activities:  Hearing? 0 0  Vision? 0 0  Difficulty concentrating or making decisions? 0 0  Walking or climbing stairs? 0 0  Dressing or bathing? 0 0  Doing errands, shopping? 0 0  Preparing Food and eating ? N N  Using the Toilet? N N  In the past six months, have you accidently leaked urine? N N  Do you have problems with loss of bowel control?  N N  Managing your Medications? N N  Managing your Finances? N N  Housekeeping or managing your Housekeeping? N N    Patient Care Team: Dettinger, Elige Radon, MD as PCP - General (Family Medicine)  Indicate any recent Medical Services you may have received from other than [facility] providers in the past year (date may be approximate).     Assessment:   This is a routine wellness examination for [facility].  Hearing/Vision screen Vision Screening - Comments:: Wears rx glasses - up to date with routine eye exams with  Dr.Wall   Goals Addressed             This Visit's Progress    Exercise 150 min/wk Moderate Activity   On track      Depression Screen    02/18/2023    8:06 AM 01/28/2023    8:04 AM 09/24/2022    8:05 AM 07/27/2022    9:26 AM 06/25/2022    9:16 AM 06/05/2022   11:17 AM 05/25/2022    3:22 PM  PHQ 2/9 Scores  PHQ - 2 Score 0 0 0 0 0 0 0  PHQ- 9 Score 0  3 3 3   0    Fall Risk    02/18/2023    8:04 AM 02/14/2023   11:22 PM 01/28/2023    8:04 AM 09/24/2022    8:05 AM 07/27/2022    9:26 AM  Fall Risk   Falls in the past year? 0 1 0 1 1  Number falls in past yr: 0 1  1 1   Injury with Fall? 0 0  0 0  Risk for fall due to : No Fall Risks   Impaired balance/gait Impaired balance/gait  Follow up Falls prevention discussed   Falls  evaluation completed Falls evaluation completed    MEDICARE RISK AT HOME: Medicare Risk at Home Any stairs in or around the home?: Yes If so, are there any without handrails?: No Home free of loose throw rugs in walkways, pet beds, electrical cords, etc?: Yes Adequate lighting in your home to reduce risk of falls?: Yes Life alert?: No Use of a cane, walker or w/c?: No Grab bars in the bathroom?: Yes Shower chair or bench in shower?: Yes Elevated toilet seat or a handicapped toilet?: Yes  TIMED UP AND GO:  Was the test performed?  No    Cognitive Function:    01/27/2018    4:00 PM  MMSE - Mini Mental State Exam  Orientation to time 5  Orientation to Place 5  Registration 3  Attention/ Calculation 5  Recall 3  Language- name 2 objects 2  Language- repeat 1  Language- follow 3 step command 3  Language- read & follow direction 1  Write a sentence 1  Copy design 1  Total score 30        02/18/2023    8:08 AM 02/16/2022    8:20 AM 02/15/2021    8:40 AM  6CIT Screen  What Year? 0 points 0 points 0 points  What month? 0 points 0 points 0 points  What time? 0 points 0 points 0 points  Count back from 20 0 points 0 points 0 points  Months in reverse 0 points 0 points 0 points  Repeat phrase 0 points 0 points 0 points  Total Score 0 points 0 points 0 points    Immunizations Immunization History  Administered Date(s) Administered   DTaP / IPV 07/22/2019, 10/21/2019, 01/27/2020   Fluad Quad(high Dose  65+) 01/26/2022   Fluad Trivalent(High Dose 65+) 01/28/2023   HIB (PRP-OMP) 07/22/2019, 10/21/2019, 01/27/2020   HIB (PRP-T) 07/22/2019, 10/21/2019, 01/27/2020   Hepatitis A, Adult 07/21/2020   Hepatitis B, PED/ADOLESCENT 07/22/2019, 10/21/2019, 05/04/2020   Influenza Split 06/01/2014, 02/11/2015, 02/17/2016   Influenza, High Dose Seasonal PF 01/27/2018, 06/23/2021   Influenza,inj,Quad PF,6+ Mos 06/01/2014, 02/11/2015, 02/17/2016   Influenza-Unspecified 06/01/2014,  02/11/2015, 02/17/2016, 01/27/2020, 06/23/2021   Moderna SARS-COV2 Booster Vaccination 10/04/2020   Moderna Sars-Covid-2 Vaccination 05/30/2019, 06/27/2019, 01/14/2020   Pneumococcal Conjugate-13 07/22/2019, 10/21/2019, 01/27/2020   Pneumococcal Polysaccharide-23 05/04/2020   Td 07/31/2011   Td (Adult),5 Lf Tetanus Toxid, Preservative Free 07/31/2011   Zoster Recombinant(Shingrix) 01/27/2018, 05/13/2020, 11/03/2020   Zoster, Unspecified 01/27/2018    TDAP status: Up to date  Flu Vaccine status: Up to date  Pneumococcal vaccine status: Up to date  Covid-19 vaccine status: Completed vaccines  Qualifies for Shingles Vaccine? Yes   Zostavax completed Yes   Shingrix Completed?: Yes  Screening Tests Health Maintenance  Topic Date Due   FOOT EXAM  Never done   OPHTHALMOLOGY EXAM  Never done   Diabetic kidney evaluation - Urine ACR  Never done   COVID-19 Vaccine (4 - 2023-24 season) 12/16/2022   HEMOGLOBIN A1C  07/25/2023   Diabetic kidney evaluation - eGFR measurement  01/24/2024   Medicare Annual Wellness (AWV)  02/18/2024   Colonoscopy  01/18/2029   DTaP/Tdap/Td (6 - Tdap) 01/26/2030   Pneumonia Vaccine 37+ Years old  Completed   INFLUENZA VACCINE  Completed   Hepatitis C Screening  Completed   Zoster Vaccines- Shingrix  Completed   HPV VACCINES  Aged Out    Health Maintenance  Health Maintenance Due  Topic Date Due   FOOT EXAM  Never done   OPHTHALMOLOGY EXAM  Never done   Diabetic kidney evaluation - Urine ACR  Never done   COVID-19 Vaccine (4 - 2023-24 season) 12/16/2022    Colorectal cancer screening: Type of screening: Colonoscopy. Completed 01/19/2019. Repeat every 10 years  Lung Cancer Screening: (Low Dose CT Chest recommended if Age 23-80 years, 20 pack-year currently smoking OR have quit w/in 15years.) does not qualify.   Lung Cancer Screening Referral: n/a  Additional Screening:  Hepatitis C Screening: does not qualify; Completed 05/09/2018  Vision  Screening: Recommended annual ophthalmology exams for early detection of glaucoma and other disorders of the eye. Is the patient up to date with their annual eye exam?  Yes  Who is the provider or what is the name of the office in which the patient attends annual eye exams? Dr.Wall  If pt is not established with a provider, would they like to be referred to a provider to establish care? No .   Dental Screening: Recommended annual dental exams for proper oral hygiene  Diabetic Foot Exam: Diabetic Foot Exam: Overdue, Pt has been advised about the importance in completing this exam. Pt is scheduled for diabetic foot exam on next office visit .  Community Resource Referral / Chronic Care Management: CRR required this visit?  No   CCM required this visit?  No     Plan:     I have personally reviewed and noted the following in the patient's chart:   Medical and social history Use of alcohol, tobacco or illicit drugs  Current medications and supplements including opioid prescriptions. Patient is not currently taking opioid prescriptions. Functional ability and status Nutritional status Physical activity Advanced directives List of other physicians Hospitalizations, surgeries, and ER visits in  previous 12 months Vitals Screenings to include cognitive, depression, and falls Referrals and appointments  In addition, I have reviewed and discussed with patient certain preventive protocols, quality metrics, and best practice recommendations. A written personalized care plan for preventive services as well as general preventive health recommendations were provided to patient.     Lorrene Reid, LPN   [id number]   After Visit Summary: (MyChart) Due to this being a telephonic visit, the after visit summary with patients personalized plan was offered to patient via MyChart   Nurse Notes: none

## 2023-02-18 NOTE — Patient Instructions (Signed)
Mr. Stanislaw , Thank you for taking time to come for your Medicare Wellness Visit. I appreciate your ongoing commitment to your health goals. Please review the following plan we discussed and let me know if I can assist you in the future.   Referrals/Orders/Follow-Ups/Clinician Recommendations: Aim for 30 minutes of exercise or brisk walking, 6-8 glasses of water, and 5 servings of fruits and vegetables each day.   This is a list of the screening recommended for you and due dates:  Health Maintenance  Topic Date Due   Complete foot exam   Never done   Eye exam for diabetics  Never done   Yearly kidney health urinalysis for diabetes  Never done   COVID-19 Vaccine (4 - 2023-24 season) 12/16/2022   Hemoglobin A1C  07/25/2023   Yearly kidney function blood test for diabetes  01/24/2024   Medicare Annual Wellness Visit  02/18/2024   Colon Cancer Screening  01/18/2029   DTaP/Tdap/Td vaccine (6 - Tdap) 01/26/2030   Pneumonia Vaccine  Completed   Flu Shot  Completed   Hepatitis C Screening  Completed   Zoster (Shingles) Vaccine  Completed   HPV Vaccine  Aged Out    Advanced directives: (Copy Requested) Please bring a copy of your health care power of attorney and living will to the office to be added to your chart at your convenience.  Next Medicare Annual Wellness Visit scheduled for next year: Yes  insert Preventive Care attachment Insert FALL PREVENTION attachment if needed

## 2023-03-04 DIAGNOSIS — J8489 Other specified interstitial pulmonary diseases: Secondary | ICD-10-CM | POA: Diagnosis not present

## 2023-03-22 ENCOUNTER — Other Ambulatory Visit: Payer: Self-pay | Admitting: Family Medicine

## 2023-03-22 DIAGNOSIS — E782 Mixed hyperlipidemia: Secondary | ICD-10-CM

## 2023-04-29 ENCOUNTER — Other Ambulatory Visit: Payer: Medicare HMO

## 2023-04-29 DIAGNOSIS — E782 Mixed hyperlipidemia: Secondary | ICD-10-CM

## 2023-04-29 DIAGNOSIS — N1831 Chronic kidney disease, stage 3a: Secondary | ICD-10-CM

## 2023-04-29 DIAGNOSIS — E1169 Type 2 diabetes mellitus with other specified complication: Secondary | ICD-10-CM

## 2023-04-29 LAB — CBC WITH DIFFERENTIAL/PLATELET
Basophils Absolute: 0 10*3/uL (ref 0.0–0.2)
Basos: 1 %
EOS (ABSOLUTE): 0.1 10*3/uL (ref 0.0–0.4)
Eos: 2 %
Hematocrit: 42.2 % (ref 37.5–51.0)
Hemoglobin: 13.8 g/dL (ref 13.0–17.7)
Immature Grans (Abs): 0 10*3/uL (ref 0.0–0.1)
Immature Granulocytes: 0 %
Lymphocytes Absolute: 1.1 10*3/uL (ref 0.7–3.1)
Lymphs: 21 %
MCH: 30.4 pg (ref 26.6–33.0)
MCHC: 32.7 g/dL (ref 31.5–35.7)
MCV: 93 fL (ref 79–97)
Monocytes Absolute: 0.7 10*3/uL (ref 0.1–0.9)
Monocytes: 13 %
Neutrophils Absolute: 3.3 10*3/uL (ref 1.4–7.0)
Neutrophils: 63 %
Platelets: 181 10*3/uL (ref 150–450)
RBC: 4.54 x10E6/uL (ref 4.14–5.80)
RDW: 12.9 % (ref 11.6–15.4)
WBC: 5.2 10*3/uL (ref 3.4–10.8)

## 2023-04-29 LAB — CMP14+EGFR
ALT: 19 [IU]/L (ref 0–44)
AST: 15 [IU]/L (ref 0–40)
Albumin: 4.4 g/dL (ref 3.8–4.8)
Alkaline Phosphatase: 60 [IU]/L (ref 44–121)
BUN/Creatinine Ratio: 15 (ref 10–24)
BUN: 22 mg/dL (ref 8–27)
Bilirubin Total: 0.5 mg/dL (ref 0.0–1.2)
CO2: 24 mmol/L (ref 20–29)
Calcium: 9.6 mg/dL (ref 8.6–10.2)
Chloride: 103 mmol/L (ref 96–106)
Creatinine, Ser: 1.46 mg/dL — ABNORMAL HIGH (ref 0.76–1.27)
Globulin, Total: 2.1 g/dL (ref 1.5–4.5)
Glucose: 115 mg/dL — ABNORMAL HIGH (ref 70–99)
Potassium: 4.5 mmol/L (ref 3.5–5.2)
Sodium: 141 mmol/L (ref 134–144)
Total Protein: 6.5 g/dL (ref 6.0–8.5)
eGFR: 51 mL/min/{1.73_m2} — ABNORMAL LOW (ref >59)

## 2023-04-29 LAB — LIPID PANEL
Chol/HDL Ratio: 3 {ratio} (ref 0.0–5.0)
Cholesterol, Total: 139 mg/dL (ref 100–199)
HDL: 46 mg/dL (ref >39)
LDL Chol Calc (NIH): 76 mg/dL (ref 0–99)
Triglycerides: 88 mg/dL (ref 0–149)
VLDL Cholesterol Cal: 17 mg/dL (ref 5–40)

## 2023-04-29 LAB — BAYER DCA HB A1C WAIVED: HB A1C (BAYER DCA - WAIVED): 5.8 % — ABNORMAL HIGH (ref 4.8–5.6)

## 2023-05-03 ENCOUNTER — Encounter: Payer: Self-pay | Admitting: Family Medicine

## 2023-05-03 ENCOUNTER — Ambulatory Visit: Payer: Medicare HMO | Admitting: Family Medicine

## 2023-05-03 VITALS — BP 119/75 | HR 61 | Temp 98.0°F | Ht 66.0 in | Wt 177.0 lb

## 2023-05-03 DIAGNOSIS — E782 Mixed hyperlipidemia: Secondary | ICD-10-CM | POA: Diagnosis not present

## 2023-05-03 DIAGNOSIS — E1169 Type 2 diabetes mellitus with other specified complication: Secondary | ICD-10-CM

## 2023-05-03 DIAGNOSIS — N1831 Chronic kidney disease, stage 3a: Secondary | ICD-10-CM | POA: Diagnosis not present

## 2023-05-03 NOTE — Progress Notes (Signed)
BP 119/75   Pulse 61   Temp 98 F (36.7 C)   Ht 5\' 6"  (1.676 m)   Wt 177 lb (80.3 kg)   SpO2 98%   BMI 28.57 kg/m    Subjective:   Patient ID: Shawn Cline, male    DOB: 03/20/1951, 73 y.o.   MRN: [id number]  HPI: Shawn Cline is a 73 y.o. male presenting on 05/03/2023 for Medical Management of Chronic Issues, Diabetes, and Hyperlipidemia  Hyperlipidemia Patient is currently taking simvastatin. He denies myalgias or weakness. He does not have a history of liver damage from it.   Type 2 diabetes mellitus Patient is not currently taking any medications as his diabetes is diet-controlled. Fasting blood sugar ranges between 100-120s at home. Patient has an opthalmology appointment scheduled soon. He follows with a podiatrist and had a foot exam 6 months ago. His diabetes is complicated by hyperlipidemia and CKD.  CKD Patient is coming in for a recheck of his CKD. He is not currently taking any medications. He denies any urinary issues.  Patient also has peripheral neuropathy, predominantly in his feet, from chemotherapy. He reports burning, numbness, and tingling that is worse after showers and during the night. He is not currently taking any medications for this.  Relevant past medical, surgical, family and social history reviewed and updated as indicated. Interim medical history since our last visit reviewed. Allergies and medications reviewed and updated.  Review of Systems  Constitutional:  Negative for chills and fever.  HENT:  Negative for congestion and sore throat.   Eyes:  Negative for visual disturbance.  Respiratory:  Negative for chest tightness and shortness of breath.   Cardiovascular:  Negative for chest pain, palpitations and leg swelling.  Gastrointestinal:  Negative for abdominal pain, constipation and diarrhea.  Endocrine: Negative for polyuria.  Genitourinary:  Negative for difficulty urinating, dysuria and hematuria.  Musculoskeletal:  Negative for  myalgias.  Neurological:  Positive for numbness (peripheral neuropathy). Negative for weakness and light-headedness.  Psychiatric/Behavioral:  Negative for dysphoric mood. The patient is not nervous/anxious.     Per HPI unless specifically indicated above   Allergies as of 05/03/2023   No Known Allergies      Medication List        Accurate as of May 03, 2023  8:49 AM. If you have any questions, ask your nurse or doctor.          STOP taking these medications    albuterol 108 (90 Base) MCG/ACT inhaler Commonly known as: VENTOLIN HFA Stopped by: Elige Radon Brea Coleson   loratadine 10 MG dissolvable tablet Commonly known as: CLARITIN REDITABS Stopped by: Elige Radon Betul Brisky   omeprazole 20 MG capsule Commonly known as: PRILOSEC Stopped by: Elige Radon Jazzmen Restivo       TAKE these medications    aspirin EC 81 MG tablet Take 81 mg by mouth daily.   cholecalciferol 25 MCG (1000 UNIT) tablet Commonly known as: VITAMIN D3 Take 1 tablet (1,000 Units total) by mouth daily.   fluticasone 50 MCG/ACT nasal spray Commonly known as: FLONASE Place 1 spray into both nostrils 2 (two) times daily as needed for allergies or rhinitis.   OneTouch Delica Plus Lancet33G Misc Test BS TID R73.9   OneTouch Verio Flex System w/Device Kit Test BS TID R73.9   OneTouch Verio test strip Generic drug: glucose blood Test BS TID R73.9   simvastatin 40 MG tablet Commonly known as: ZOCOR TAKE 1 TABLET BY MOUTH EVERY OTHER DAY  Objective:   BP 119/75   Pulse 61   Temp 98 F (36.7 C)   Ht 5\' 6"  (1.676 m)   Wt 177 lb (80.3 kg)   SpO2 98%   BMI 28.57 kg/m   Wt Readings from Last 3 Encounters:  05/03/23 177 lb (80.3 kg)  02/18/23 174 lb (78.9 kg)  01/28/23 178 lb (80.7 kg)    Physical Exam Vitals and nursing note reviewed.  Constitutional:      Appearance: Normal appearance. He is normal weight.  HENT:     Head: Normocephalic and atraumatic.     Right Ear:  External ear normal.     Left Ear: External ear normal.  Eyes:     Conjunctiva/sclera: Conjunctivae normal.  Cardiovascular:     Rate and Rhythm: Normal rate and regular rhythm.     Heart sounds: Normal heart sounds.  Pulmonary:     Effort: Pulmonary effort is normal.     Breath sounds: Normal breath sounds. No wheezing or rales.  Abdominal:     General: Abdomen is flat. There is no distension.     Palpations: Abdomen is soft.     Tenderness: There is no abdominal tenderness. There is no right CVA tenderness or left CVA tenderness.  Musculoskeletal:     Cervical back: Normal range of motion and neck supple. No tenderness.     Right lower leg: No edema.     Left lower leg: No edema.  Skin:    General: Skin is warm and dry.     Findings: Lesion (healing lesion on tip of nose from dermatology biopsy) present.  Neurological:     Mental Status: He is alert and oriented to person, place, and time.  Psychiatric:        Mood and Affect: Mood normal.        Behavior: Behavior normal.        Thought Content: Thought content normal.        Judgment: Judgment normal.     Results for orders placed or performed in visit on 04/29/23  Bayer DCA Hb A1c Waived   Collection Time: 04/29/23  9:22 AM  Result Value Ref Range   HB A1C (BAYER DCA - WAIVED) 5.8 (H) 4.8 - 5.6 %  CBC with Differential/Platelet   Collection Time: 04/29/23  9:25 AM  Result Value Ref Range   WBC 5.2 3.4 - 10.8 x10E3/uL   RBC 4.54 4.14 - 5.80 x10E6/uL   Hemoglobin 13.8 13.0 - 17.7 g/dL   Hematocrit 29.5 62.1 - 51.0 %   MCV 93 79 - 97 fL   MCH 30.4 26.6 - 33.0 pg   MCHC 32.7 31.5 - 35.7 g/dL   RDW 30.8 65.7 - 84.6 %   Platelets 181 150 - 450 x10E3/uL   Neutrophils 63 Not Estab. %   Lymphs 21 Not Estab. %   Monocytes 13 Not Estab. %   Eos 2 Not Estab. %   Basos 1 Not Estab. %   Neutrophils Absolute 3.3 1.4 - 7.0 x10E3/uL   Lymphocytes Absolute 1.1 0.7 - 3.1 x10E3/uL   Monocytes Absolute 0.7 0.1 - 0.9 x10E3/uL    EOS (ABSOLUTE) 0.1 0.0 - 0.4 x10E3/uL   Basophils Absolute 0.0 0.0 - 0.2 x10E3/uL   Immature Granulocytes 0 Not Estab. %   Immature Grans (Abs) 0.0 0.0 - 0.1 x10E3/uL  CMP14+EGFR   Collection Time: 04/29/23  9:25 AM  Result Value Ref Range   Glucose 115 (H) 70 - 99  mg/dL   BUN 22 8 - 27 mg/dL   Creatinine, Ser 9.56 (H) 0.76 - 1.27 mg/dL   eGFR 51 (L) >21 HY/QMV/7.84   BUN/Creatinine Ratio 15 10 - 24   Sodium 141 134 - 144 mmol/L   Potassium 4.5 3.5 - 5.2 mmol/L   Chloride 103 96 - 106 mmol/L   CO2 24 20 - 29 mmol/L   Calcium 9.6 8.6 - 10.2 mg/dL   Total Protein 6.5 6.0 - 8.5 g/dL   Albumin 4.4 3.8 - 4.8 g/dL   Globulin, Total 2.1 1.5 - 4.5 g/dL   Bilirubin Total 0.5 0.0 - 1.2 mg/dL   Alkaline Phosphatase 60 44 - 121 IU/L   AST 15 0 - 40 IU/L   ALT 19 0 - 44 IU/L  Lipid panel   Collection Time: 04/29/23  9:25 AM  Result Value Ref Range   Cholesterol, Total 139 100 - 199 mg/dL   Triglycerides 88 0 - 149 mg/dL   HDL 46 >69 mg/dL   VLDL Cholesterol Cal 17 5 - 40 mg/dL   LDL Chol Calc (NIH) 76 0 - 99 mg/dL   Chol/HDL Ratio 3.0 0.0 - 5.0 ratio    Assessment & Plan:   Problem List Items Addressed This Visit       Endocrine   Type 2 diabetes mellitus with other specified complication (HCC) - Primary   Relevant Orders   Microalbumin / creatinine urine ratio     Genitourinary   CKD (chronic kidney disease) stage 3, GFR 30-59 ml/min (HCC)     Other   Hyperlipidemia    Patient is doing well with no significant health concerns. Cholesterol well-controlled on simvastatin with LDL 76. Blood glucose well-controlled with HgbA1c 5.8%. CKD stable with GFR 51. Will not make any medication changes today. Can consider starting gabapentin in future for peripheral neuropathy if symptoms become more bothersome.  Follow up plan: Return in about 3 months (around 08/01/2023), or if symptoms worsen or fail to improve, for Diabetes and hyperlipidemia and CKD recheck.  Counseling provided for  all of the vaccine components Orders Placed This Encounter  Procedures   Microalbumin / creatinine urine ratio    Gillermina Phy, Medical Student [facility] 05/03/2023, 8:49 AM   I was personally present for all components of the history, physical exam and/or medical decision making.  I agree with the documentation performed by the student and agree with assessment and plan above.  A1c looks good.  Everything else looked good on blood work.  No changes Arville Care, MD [facility] 05/15/2023, 11:30 AM

## 2023-05-04 LAB — MICROALBUMIN / CREATININE URINE RATIO
Creatinine, Urine: 192.9 mg/dL
Microalb/Creat Ratio: 4 mg/g{creat} (ref 0–29)
Microalbumin, Urine: 8.2 ug/mL

## 2023-05-10 DIAGNOSIS — H6123 Impacted cerumen, bilateral: Secondary | ICD-10-CM | POA: Diagnosis not present

## 2023-05-10 DIAGNOSIS — H938X3 Other specified disorders of ear, bilateral: Secondary | ICD-10-CM | POA: Diagnosis not present

## 2023-05-10 DIAGNOSIS — J3489 Other specified disorders of nose and nasal sinuses: Secondary | ICD-10-CM | POA: Diagnosis not present

## 2023-05-28 DIAGNOSIS — D485 Neoplasm of uncertain behavior of skin: Secondary | ICD-10-CM | POA: Diagnosis not present

## 2023-05-28 DIAGNOSIS — D2239 Melanocytic nevi of other parts of face: Secondary | ICD-10-CM | POA: Diagnosis not present

## 2023-06-05 ENCOUNTER — Other Ambulatory Visit: Payer: Self-pay | Admitting: Family Medicine

## 2023-06-05 DIAGNOSIS — C8310 Mantle cell lymphoma, unspecified site: Secondary | ICD-10-CM | POA: Diagnosis not present

## 2023-06-05 DIAGNOSIS — C8318 Mantle cell lymphoma, lymph nodes of multiple sites: Secondary | ICD-10-CM | POA: Diagnosis not present

## 2023-06-09 ENCOUNTER — Other Ambulatory Visit: Payer: Self-pay | Admitting: Family Medicine

## 2023-06-09 DIAGNOSIS — R058 Other specified cough: Secondary | ICD-10-CM

## 2023-06-13 DIAGNOSIS — E785 Hyperlipidemia, unspecified: Secondary | ICD-10-CM | POA: Diagnosis not present

## 2023-06-13 DIAGNOSIS — U099 Post covid-19 condition, unspecified: Secondary | ICD-10-CM | POA: Diagnosis not present

## 2023-06-13 DIAGNOSIS — N1831 Chronic kidney disease, stage 3a: Secondary | ICD-10-CM | POA: Diagnosis not present

## 2023-06-13 DIAGNOSIS — Z8249 Family history of ischemic heart disease and other diseases of the circulatory system: Secondary | ICD-10-CM | POA: Diagnosis not present

## 2023-06-13 DIAGNOSIS — J849 Interstitial pulmonary disease, unspecified: Secondary | ICD-10-CM | POA: Diagnosis not present

## 2023-06-13 DIAGNOSIS — M1990 Unspecified osteoarthritis, unspecified site: Secondary | ICD-10-CM | POA: Diagnosis not present

## 2023-06-13 DIAGNOSIS — Z809 Family history of malignant neoplasm, unspecified: Secondary | ICD-10-CM | POA: Diagnosis not present

## 2023-06-13 DIAGNOSIS — Z7982 Long term (current) use of aspirin: Secondary | ICD-10-CM | POA: Diagnosis not present

## 2023-06-13 DIAGNOSIS — J302 Other seasonal allergic rhinitis: Secondary | ICD-10-CM | POA: Diagnosis not present

## 2023-06-13 DIAGNOSIS — I129 Hypertensive chronic kidney disease with stage 1 through stage 4 chronic kidney disease, or unspecified chronic kidney disease: Secondary | ICD-10-CM | POA: Diagnosis not present

## 2023-06-13 DIAGNOSIS — Z87891 Personal history of nicotine dependence: Secondary | ICD-10-CM | POA: Diagnosis not present

## 2023-06-13 DIAGNOSIS — K219 Gastro-esophageal reflux disease without esophagitis: Secondary | ICD-10-CM | POA: Diagnosis not present

## 2023-07-29 DIAGNOSIS — Z872 Personal history of diseases of the skin and subcutaneous tissue: Secondary | ICD-10-CM | POA: Diagnosis not present

## 2023-07-29 DIAGNOSIS — Z09 Encounter for follow-up examination after completed treatment for conditions other than malignant neoplasm: Secondary | ICD-10-CM | POA: Diagnosis not present

## 2023-07-29 DIAGNOSIS — D0461 Carcinoma in situ of skin of right upper limb, including shoulder: Secondary | ICD-10-CM | POA: Diagnosis not present

## 2023-07-29 DIAGNOSIS — D485 Neoplasm of uncertain behavior of skin: Secondary | ICD-10-CM | POA: Diagnosis not present

## 2023-08-13 DIAGNOSIS — N183 Chronic kidney disease, stage 3 unspecified: Secondary | ICD-10-CM | POA: Diagnosis not present

## 2023-08-13 DIAGNOSIS — D631 Anemia in chronic kidney disease: Secondary | ICD-10-CM | POA: Diagnosis not present

## 2023-08-13 DIAGNOSIS — C8310 Mantle cell lymphoma, unspecified site: Secondary | ICD-10-CM | POA: Diagnosis not present

## 2023-08-13 DIAGNOSIS — E785 Hyperlipidemia, unspecified: Secondary | ICD-10-CM | POA: Diagnosis not present

## 2023-08-13 DIAGNOSIS — N2581 Secondary hyperparathyroidism of renal origin: Secondary | ICD-10-CM | POA: Diagnosis not present

## 2023-08-14 LAB — LAB REPORT - SCANNED
Albumin, Urine POC: 7.6
Creatinine, POC: 174.7 mg/dL
EGFR: 57
Microalb Creat Ratio: 4

## 2023-08-15 ENCOUNTER — Encounter: Payer: Self-pay | Admitting: Family Medicine

## 2023-08-15 ENCOUNTER — Ambulatory Visit: Payer: Medicare HMO | Admitting: Family Medicine

## 2023-08-15 VITALS — BP 115/77 | HR 64 | Ht 66.0 in | Wt 175.0 lb

## 2023-08-15 DIAGNOSIS — R351 Nocturia: Secondary | ICD-10-CM | POA: Diagnosis not present

## 2023-08-15 DIAGNOSIS — E1169 Type 2 diabetes mellitus with other specified complication: Secondary | ICD-10-CM | POA: Diagnosis not present

## 2023-08-15 DIAGNOSIS — N1831 Chronic kidney disease, stage 3a: Secondary | ICD-10-CM

## 2023-08-15 DIAGNOSIS — E782 Mixed hyperlipidemia: Secondary | ICD-10-CM | POA: Diagnosis not present

## 2023-08-15 DIAGNOSIS — Z7184 Encounter for health counseling related to travel: Secondary | ICD-10-CM | POA: Diagnosis not present

## 2023-08-15 LAB — BAYER DCA HB A1C WAIVED: HB A1C (BAYER DCA - WAIVED): 5.8 % — ABNORMAL HIGH (ref 4.8–5.6)

## 2023-08-15 MED ORDER — AMOXICILLIN-POT CLAVULANATE 875-125 MG PO TABS: 1.0000 | ORAL_TABLET | Freq: Two times a day (BID) | ORAL | 0 refills | Status: DC

## 2023-08-15 MED ORDER — SIMVASTATIN 40 MG PO TABS: 40.0000 mg | ORAL_TABLET | ORAL | 3 refills | Status: DC

## 2023-08-15 MED ORDER — ONETOUCH DELICA PLUS LANCET33G MISC: 3 refills | Status: AC

## 2023-08-15 MED ORDER — ONETOUCH VERIO VI STRP: ORAL_STRIP | 3 refills | Status: DC

## 2023-08-15 NOTE — Addendum Note (Signed)
 Addended by: Jolyne Needs on: 08/15/2023 10:05 AM   Modules accepted: Orders

## 2023-08-15 NOTE — Progress Notes (Addendum)
 BP 115/77   Pulse 64   Ht 5\' 6"  (1.676 m)   Wt 175 lb (79.4 kg)   SpO2 96%   BMI 28.25 kg/m    Subjective:   Patient ID: Shawn Cline, male    DOB: [birth year], 73 y.o.   MRN: [id number]  HPI: Marq Milic is a 73 y.o. male presenting on 08/15/2023 for Hyperlipidemia, Medical Management of Chronic Issues, and Diabetes   HPI Type 2 diabetes mellitus Patient comes in today for recheck of his diabetes. Patient has been currently taking no medication currently. Patient is not currently on an ACE inhibitor/ARB. Patient has not seen an ophthalmologist this year. Patient denies any new issues with their feet. The symptom started onset as an adult hyperlipidemia and ckd ARE RELATED TO DM   Hyperlipidemia Patient is coming in for recheck of his hyperlipidemia. The patient is currently taking simvastatin . They deny any issues with myalgias or history of liver damage from it. They deny any focal numbness or weakness or chest pain.   Relevant past medical, surgical, family and social history reviewed and updated as indicated. Interim medical history since our last visit reviewed. Allergies and medications reviewed and updated.  Review of Systems  Constitutional:  Negative for chills and fever.  Eyes:  Negative for visual disturbance.  Respiratory:  Negative for shortness of breath and wheezing.   Cardiovascular:  Negative for chest pain and leg swelling.  Musculoskeletal:  Negative for back pain and gait problem.  Skin:  Negative for rash.  All other systems reviewed and are negative.   Per HPI unless specifically indicated above   Allergies as of 08/15/2023   No Known Allergies      Medication List        Accurate as of Aug 15, 2023  9:59 AM. If you have any questions, ask your nurse or doctor.          aspirin EC 81 MG tablet Take 81 mg by mouth daily.   cholecalciferol 25 MCG (1000 UNIT) tablet Commonly known as: VITAMIN D3 Take 1 tablet (1,000 Units total) by  mouth daily.   fluticasone  50 MCG/ACT nasal spray Commonly known as: FLONASE  PLACE 1 SPRAY INTO BOTH NOSTRILS 2 (TWO) TIMES DAILY AS NEEDED FOR ALLERGIES OR RHINITIS.   loratadine 10 MG tablet Commonly known as: CLARITIN Take 10 mg by mouth daily.   OneTouch Delica Plus Lancet33G Misc Test BS TID R73.9   OneTouch Verio Flex System w/Device Kit Test BS TID R73.9   OneTouch Verio test strip Generic drug: glucose blood Test BS TID R73.9   simvastatin  40 MG tablet Commonly known as: ZOCOR  Take 1 tablet (40 mg total) by mouth every other day.         Objective:   BP 115/77   Pulse 64   Ht 5\' 6"  (1.676 m)   Wt 175 lb (79.4 kg)   SpO2 96%   BMI 28.25 kg/m   Wt Readings from Last 3 Encounters:  08/15/23 175 lb (79.4 kg)  05/03/23 177 lb (80.3 kg)  02/18/23 174 lb (78.9 kg)    Physical Exam Vitals and nursing note reviewed.  Constitutional:      General: He is not in acute distress.    Appearance: He is well-developed. He is not diaphoretic.  Eyes:     General: No scleral icterus.       Right eye: No discharge.     Conjunctiva/sclera: Conjunctivae normal.     Pupils: Pupils are  equal, round, and reactive to light.  Neck:     Thyroid : No thyromegaly.  Cardiovascular:     Rate and Rhythm: Normal rate and regular rhythm.     Heart sounds: Normal heart sounds. No murmur heard. Pulmonary:     Effort: Pulmonary effort is normal. No respiratory distress.     Breath sounds: Normal breath sounds. No wheezing.  Musculoskeletal:        General: Normal range of motion.     Cervical back: Neck supple.  Lymphadenopathy:     Cervical: No cervical adenopathy.  Skin:    General: Skin is warm and dry.     Findings: No rash.  Neurological:     Mental Status: He is alert and oriented to person, place, and time.     Coordination: Coordination normal.  Psychiatric:        Behavior: Behavior normal.       Assessment & Plan:   Problem List Items Addressed This Visit        Endocrine   Type 2 diabetes mellitus with other specified complication (HCC) - Primary   Relevant Medications   simvastatin  (ZOCOR ) 40 MG tablet   Other Relevant Orders   Bayer DCA Hb A1c Waived     Genitourinary   CKD (chronic kidney disease) stage 3, GFR 30-59 ml/min (HCC)   Relevant Orders   Bayer DCA Hb A1c Waived     Other   Hyperlipidemia   Relevant Medications   simvastatin  (ZOCOR ) 40 MG tablet   Other Visit Diagnoses       Nocturia           A1c 5.8, looks good.  Blood work that he had done with his nephrologist looks good too.  No changes  Patient immunosuppressed with his history, his oncologist recommended that he have an antibiotic for traveling Follow up plan: Return in about 3 months (around 11/15/2023), or if symptoms worsen or fail to improve, for Diabetes CKD hyperlipidemia recheck.  Counseling provided for all of the vaccine components Orders Placed This Encounter  Procedures   Bayer DCA Hb A1c Waived    Jolyne Needs, MD [facility] 08/15/2023, 9:59 AM

## 2023-08-19 DIAGNOSIS — J8489 Other specified interstitial pulmonary diseases: Secondary | ICD-10-CM | POA: Diagnosis not present

## 2023-08-19 DIAGNOSIS — H25013 Cortical age-related cataract, bilateral: Secondary | ICD-10-CM | POA: Diagnosis not present

## 2023-08-19 DIAGNOSIS — H53032 Strabismic amblyopia, left eye: Secondary | ICD-10-CM | POA: Diagnosis not present

## 2023-08-19 DIAGNOSIS — H52223 Regular astigmatism, bilateral: Secondary | ICD-10-CM | POA: Diagnosis not present

## 2023-08-19 DIAGNOSIS — Z87891 Personal history of nicotine dependence: Secondary | ICD-10-CM | POA: Diagnosis not present

## 2023-08-19 DIAGNOSIS — Z9484 Stem cells transplant status: Secondary | ICD-10-CM | POA: Diagnosis not present

## 2023-08-19 LAB — OPHTHALMOLOGY REPORT-SCANNED

## 2023-08-20 DIAGNOSIS — J8489 Other specified interstitial pulmonary diseases: Secondary | ICD-10-CM | POA: Diagnosis not present

## 2023-08-20 DIAGNOSIS — Z9484 Stem cells transplant status: Secondary | ICD-10-CM | POA: Diagnosis not present

## 2023-08-20 DIAGNOSIS — Z87891 Personal history of nicotine dependence: Secondary | ICD-10-CM | POA: Diagnosis not present

## 2023-08-29 DIAGNOSIS — D045 Carcinoma in situ of skin of trunk: Secondary | ICD-10-CM | POA: Diagnosis not present

## 2023-09-04 DIAGNOSIS — C8318 Mantle cell lymphoma, lymph nodes of multiple sites: Secondary | ICD-10-CM | POA: Diagnosis not present

## 2023-09-04 DIAGNOSIS — C8310 Mantle cell lymphoma, unspecified site: Secondary | ICD-10-CM | POA: Diagnosis not present

## 2023-09-19 ENCOUNTER — Other Ambulatory Visit: Payer: Self-pay | Admitting: Family Medicine

## 2023-09-19 DIAGNOSIS — E782 Mixed hyperlipidemia: Secondary | ICD-10-CM

## 2023-11-15 ENCOUNTER — Encounter: Payer: Self-pay | Admitting: Family Medicine

## 2023-11-15 ENCOUNTER — Ambulatory Visit: Admitting: Family Medicine

## 2023-11-15 VITALS — BP 113/73 | HR 60 | Ht 66.0 in | Wt 178.0 lb

## 2023-11-15 DIAGNOSIS — E1122 Type 2 diabetes mellitus with diabetic chronic kidney disease: Secondary | ICD-10-CM | POA: Diagnosis not present

## 2023-11-15 DIAGNOSIS — N1831 Chronic kidney disease, stage 3a: Secondary | ICD-10-CM | POA: Diagnosis not present

## 2023-11-15 DIAGNOSIS — E782 Mixed hyperlipidemia: Secondary | ICD-10-CM

## 2023-11-15 DIAGNOSIS — E1169 Type 2 diabetes mellitus with other specified complication: Secondary | ICD-10-CM | POA: Diagnosis not present

## 2023-11-15 LAB — LIPID PANEL

## 2023-11-15 LAB — BAYER DCA HB A1C WAIVED: HB A1C (BAYER DCA - WAIVED): 5.7 % — ABNORMAL HIGH (ref 4.8–5.6)

## 2023-11-15 MED ORDER — SIMVASTATIN 40 MG PO TABS: 40.0000 mg | ORAL_TABLET | ORAL | 3 refills | Status: AC

## 2023-11-15 NOTE — Progress Notes (Signed)
 BP 113/73   Pulse 60   Ht 5' 6 (1.676 m)   Wt 178 lb (80.7 kg)   SpO2 99%   BMI 28.73 kg/m    Subjective:   Patient ID: Shawn Cline, male    DOB: 12/03/1950, 73 y.o.   MRN: [id number]  HPI: Shawn Cline is a 73 y.o. male presenting on 11/15/2023 for Medical Management of Chronic Issues, Hyperlipidemia, and Diabetes   HPI Type 2 diabetes mellitus Patient comes in today for recheck of his diabetes. Patient has been currently taking diet control. Patient is not currently on an ACE inhibitor/ARB. Patient has seen an ophthalmologist this year. Patient denies any new issues with their feet. The symptom started onset as an adult hyperlipidemia and CKD ARE RELATED TO DM   Hyperlipidemia Patient is coming in for recheck of his hyperlipidemia. The patient is currently taking simvastatin  every day. They deny any issues with myalgias or history of liver damage from it. They deny any focal numbness or weakness or chest pain.   CKD recheck Patient is coming in for CKD 3 recheck.  Has been stable.  He denies any urinary symptoms.  Relevant past medical, surgical, family and social history reviewed and updated as indicated. Interim medical history since our last visit reviewed. Allergies and medications reviewed and updated.  Review of Systems  Constitutional:  Negative for chills and fever.  Eyes:  Negative for visual disturbance.  Respiratory:  Negative for shortness of breath and wheezing.   Cardiovascular:  Negative for chest pain and leg swelling.  Musculoskeletal:  Negative for back pain and gait problem.  Skin:  Negative for rash.  Neurological:  Negative for dizziness and light-headedness.  All other systems reviewed and are negative.   Per HPI unless specifically indicated above   Allergies as of 11/15/2023   No Known Allergies      Medication List        Accurate as of November 15, 2023  9:01 AM. If you have any questions, ask your nurse or doctor.          STOP  taking these medications    amoxicillin -clavulanate 875-125 MG tablet Commonly known as: AUGMENTIN  Stopped by: Fonda LABOR Taiten Brawn       TAKE these medications    aspirin EC 81 MG tablet Take 81 mg by mouth daily.   cholecalciferol 25 MCG (1000 UNIT) tablet Commonly known as: VITAMIN D3 Take 1 tablet (1,000 Units total) by mouth daily.   fluticasone  50 MCG/ACT nasal spray Commonly known as: FLONASE  PLACE 1 SPRAY INTO BOTH NOSTRILS 2 (TWO) TIMES DAILY AS NEEDED FOR ALLERGIES OR RHINITIS.   loratadine 10 MG tablet Commonly known as: CLARITIN Take 10 mg by mouth daily.   omeprazole 20 MG tablet Commonly known as: PRILOSEC OTC Take 20 mg by mouth daily.   OneTouch Delica Plus Lancet33G Misc Test BS TID R73.9   OneTouch Verio Flex System w/Device Kit Test BS TID R73.9   OneTouch Verio test strip Generic drug: glucose blood Test BS TID R73.9   simvastatin  40 MG tablet Commonly known as: ZOCOR  Take 1 tablet (40 mg total) by mouth every other day.         Objective:   BP 113/73   Pulse 60   Ht 5' 6 (1.676 m)   Wt 178 lb (80.7 kg)   SpO2 99%   BMI 28.73 kg/m   Wt Readings from Last 3 Encounters:  11/15/23 178 lb (80.7 kg)  08/15/23 175  lb (79.4 kg)  05/03/23 177 lb (80.3 kg)    Physical Exam Vitals and nursing note reviewed.  Constitutional:      General: He is not in acute distress.    Appearance: He is well-developed. He is not diaphoretic.  Eyes:     General: No scleral icterus.    Conjunctiva/sclera: Conjunctivae normal.  Neck:     Thyroid : No thyromegaly.  Cardiovascular:     Rate and Rhythm: Normal rate and regular rhythm.     Heart sounds: Normal heart sounds. No murmur heard. Pulmonary:     Effort: Pulmonary effort is normal. No respiratory distress.     Breath sounds: Normal breath sounds. No wheezing.  Musculoskeletal:        General: No swelling. Normal range of motion.     Cervical back: Neck supple.  Lymphadenopathy:     Cervical:  No cervical adenopathy.  Skin:    General: Skin is warm and dry.     Findings: No rash.  Neurological:     Mental Status: He is alert and oriented to person, place, and time.     Coordination: Coordination normal.  Psychiatric:        Behavior: Behavior normal.       Assessment & Plan:   Problem List Items Addressed This Visit       Endocrine   Type 2 diabetes mellitus with other specified complication (HCC)   Relevant Medications   simvastatin  (ZOCOR ) 40 MG tablet     Genitourinary   CKD (chronic kidney disease) stage 3, GFR 30-59 ml/min (HCC) - Primary   Relevant Orders   Bayer DCA Hb A1c Waived   CBC with Differential/Platelet   CMP14+EGFR   Lipid panel     Other   Hyperlipidemia   Relevant Medications   simvastatin  (ZOCOR ) 40 MG tablet   Other Relevant Orders   Bayer DCA Hb A1c Waived   CBC with Differential/Platelet   CMP14+EGFR   Lipid panel  A1c 5.7 and looks good.  No changes, blood pressure looks good today as well.  Follow up plan: Return in about 3 months (around 02/15/2024), or if symptoms worsen or fail to improve, for Diabetes and CKD.  Counseling provided for all of the vaccine components Orders Placed This Encounter  Procedures   Bayer DCA Hb A1c Waived   CBC with Differential/Platelet   CMP14+EGFR   Lipid panel    Fonda Levins, MD [facility] 11/15/2023, 9:01 AM

## 2023-11-16 LAB — CMP14+EGFR
ALT: 20 IU/L (ref 0–44)
AST: 19 IU/L (ref 0–40)
Albumin: 4.3 g/dL (ref 3.8–4.8)
Alkaline Phosphatase: 66 IU/L (ref 44–121)
BUN/Creatinine Ratio: 18 (ref 10–24)
BUN: 27 mg/dL (ref 8–27)
Bilirubin Total: 0.4 mg/dL (ref 0.0–1.2)
CO2: 23 mmol/L (ref 20–29)
Calcium: 9.4 mg/dL (ref 8.6–10.2)
Chloride: 103 mmol/L (ref 96–106)
Creatinine, Ser: 1.54 mg/dL — ABNORMAL HIGH (ref 0.76–1.27)
Globulin, Total: 2.2 g/dL (ref 1.5–4.5)
Glucose: 111 mg/dL — ABNORMAL HIGH (ref 70–99)
Potassium: 4 mmol/L (ref 3.5–5.2)
Sodium: 141 mmol/L (ref 134–144)
Total Protein: 6.5 g/dL (ref 6.0–8.5)
eGFR: 48 mL/min/1.73 — ABNORMAL LOW (ref >59)

## 2023-11-16 LAB — CBC WITH DIFFERENTIAL/PLATELET
Basophils Absolute: 0 x10E3/uL (ref 0.0–0.2)
Basos: 0 %
EOS (ABSOLUTE): 0.2 x10E3/uL (ref 0.0–0.4)
Eos: 3 %
Hematocrit: 41.6 % (ref 37.5–51.0)
Hemoglobin: 13.7 g/dL (ref 13.0–17.7)
Immature Grans (Abs): 0 x10E3/uL (ref 0.0–0.1)
Immature Granulocytes: 0 %
Lymphocytes Absolute: 1.4 x10E3/uL (ref 0.7–3.1)
Lymphs: 24 %
MCH: 32 pg (ref 26.6–33.0)
MCHC: 32.9 g/dL (ref 31.5–35.7)
MCV: 97 fL (ref 79–97)
Monocytes Absolute: 0.7 x10E3/uL (ref 0.1–0.9)
Monocytes: 13 %
Neutrophils Absolute: 3.3 x10E3/uL (ref 1.4–7.0)
Neutrophils: 60 %
Platelets: 162 x10E3/uL (ref 150–450)
RBC: 4.28 x10E6/uL (ref 4.14–5.80)
RDW: 13 % (ref 11.6–15.4)
WBC: 5.6 x10E3/uL (ref 3.4–10.8)

## 2023-11-16 LAB — LIPID PANEL
Chol/HDL Ratio: 3.7 ratio (ref 0.0–5.0)
Cholesterol, Total: 155 mg/dL (ref 100–199)
HDL: 42 mg/dL (ref >39)
LDL Chol Calc (NIH): 95 mg/dL (ref 0–99)
Triglycerides: 97 mg/dL (ref 0–149)
VLDL Cholesterol Cal: 18 mg/dL (ref 5–40)

## 2023-11-20 DIAGNOSIS — J8489 Other specified interstitial pulmonary diseases: Secondary | ICD-10-CM | POA: Diagnosis not present

## 2023-11-20 DIAGNOSIS — Z9484 Stem cells transplant status: Secondary | ICD-10-CM | POA: Diagnosis not present

## 2023-11-20 DIAGNOSIS — J309 Allergic rhinitis, unspecified: Secondary | ICD-10-CM | POA: Diagnosis not present

## 2023-11-20 DIAGNOSIS — R0602 Shortness of breath: Secondary | ICD-10-CM | POA: Diagnosis not present

## 2023-11-20 DIAGNOSIS — J302 Other seasonal allergic rhinitis: Secondary | ICD-10-CM | POA: Diagnosis not present

## 2023-11-20 DIAGNOSIS — R0982 Postnasal drip: Secondary | ICD-10-CM | POA: Diagnosis not present

## 2023-11-21 ENCOUNTER — Ambulatory Visit: Payer: Self-pay | Admitting: Family Medicine

## 2023-12-01 ENCOUNTER — Other Ambulatory Visit: Payer: Self-pay | Admitting: Family Medicine

## 2023-12-01 DIAGNOSIS — R058 Other specified cough: Secondary | ICD-10-CM

## 2024-01-09 NOTE — Progress Notes (Signed)
 Shawn Cline                                          MRN: [id number]   01/09/2024   The [facility] Quality Team Specialist reviewed this patient medical record for the purposes of chart review for care gap closure. The following were reviewed: abstraction for care gap closure-glycemic status assessment.    [facility] Quality Team

## 2024-01-20 ENCOUNTER — Other Ambulatory Visit

## 2024-01-20 DIAGNOSIS — N183 Chronic kidney disease, stage 3 unspecified: Secondary | ICD-10-CM | POA: Diagnosis not present

## 2024-01-22 DIAGNOSIS — Z08 Encounter for follow-up examination after completed treatment for malignant neoplasm: Secondary | ICD-10-CM | POA: Diagnosis not present

## 2024-01-22 DIAGNOSIS — C8319 Mantle cell lymphoma, extranodal and solid organ sites: Secondary | ICD-10-CM | POA: Diagnosis not present

## 2024-01-22 DIAGNOSIS — C8310 Mantle cell lymphoma, unspecified site: Secondary | ICD-10-CM | POA: Diagnosis not present

## 2024-01-22 DIAGNOSIS — Z9484 Stem cells transplant status: Secondary | ICD-10-CM | POA: Diagnosis not present

## 2024-01-22 DIAGNOSIS — Z9481 Bone marrow transplant status: Secondary | ICD-10-CM | POA: Diagnosis not present

## 2024-01-22 DIAGNOSIS — C8318 Mantle cell lymphoma, lymph nodes of multiple sites: Secondary | ICD-10-CM | POA: Diagnosis not present

## 2024-01-22 DIAGNOSIS — Z9222 Personal history of monoclonal drug therapy: Secondary | ICD-10-CM | POA: Diagnosis not present

## 2024-01-22 DIAGNOSIS — Z87891 Personal history of nicotine dependence: Secondary | ICD-10-CM | POA: Diagnosis not present

## 2024-01-22 DIAGNOSIS — Z23 Encounter for immunization: Secondary | ICD-10-CM | POA: Diagnosis not present

## 2024-01-22 DIAGNOSIS — Z8579 Personal history of other malignant neoplasms of lymphoid, hematopoietic and related tissues: Secondary | ICD-10-CM | POA: Diagnosis not present

## 2024-01-31 DIAGNOSIS — N1831 Chronic kidney disease, stage 3a: Secondary | ICD-10-CM | POA: Diagnosis not present

## 2024-01-31 DIAGNOSIS — N2581 Secondary hyperparathyroidism of renal origin: Secondary | ICD-10-CM | POA: Diagnosis not present

## 2024-01-31 DIAGNOSIS — D631 Anemia in chronic kidney disease: Secondary | ICD-10-CM | POA: Diagnosis not present

## 2024-01-31 DIAGNOSIS — C8310 Mantle cell lymphoma, unspecified site: Secondary | ICD-10-CM | POA: Diagnosis not present

## 2024-02-07 DIAGNOSIS — L280 Lichen simplex chronicus: Secondary | ICD-10-CM | POA: Diagnosis not present

## 2024-02-07 DIAGNOSIS — Z85828 Personal history of other malignant neoplasm of skin: Secondary | ICD-10-CM | POA: Diagnosis not present

## 2024-02-07 DIAGNOSIS — L821 Other seborrheic keratosis: Secondary | ICD-10-CM | POA: Diagnosis not present

## 2024-02-07 DIAGNOSIS — L570 Actinic keratosis: Secondary | ICD-10-CM | POA: Diagnosis not present

## 2024-02-07 DIAGNOSIS — L905 Scar conditions and fibrosis of skin: Secondary | ICD-10-CM | POA: Diagnosis not present

## 2024-02-07 DIAGNOSIS — Z86007 Personal history of in-situ neoplasm of skin: Secondary | ICD-10-CM | POA: Diagnosis not present

## 2024-02-07 DIAGNOSIS — L814 Other melanin hyperpigmentation: Secondary | ICD-10-CM | POA: Diagnosis not present

## 2024-02-07 DIAGNOSIS — Z08 Encounter for follow-up examination after completed treatment for malignant neoplasm: Secondary | ICD-10-CM | POA: Diagnosis not present

## 2024-02-07 DIAGNOSIS — D225 Melanocytic nevi of trunk: Secondary | ICD-10-CM | POA: Diagnosis not present

## 2024-02-19 ENCOUNTER — Ambulatory Visit: Payer: Medicare HMO

## 2024-02-20 ENCOUNTER — Encounter: Payer: Self-pay | Admitting: Family Medicine

## 2024-02-20 ENCOUNTER — Ambulatory Visit

## 2024-02-20 ENCOUNTER — Ambulatory Visit (INDEPENDENT_AMBULATORY_CARE_PROVIDER_SITE_OTHER): Payer: Self-pay | Admitting: Family Medicine

## 2024-02-20 VITALS — BP 129/74 | HR 67 | Ht 66.0 in | Wt 180.0 lb

## 2024-02-20 VITALS — BP 113/73 | HR 60 | Ht 66.0 in | Wt 178.0 lb

## 2024-02-20 DIAGNOSIS — E782 Mixed hyperlipidemia: Secondary | ICD-10-CM

## 2024-02-20 DIAGNOSIS — C8318 Mantle cell lymphoma, lymph nodes of multiple sites: Secondary | ICD-10-CM | POA: Diagnosis not present

## 2024-02-20 DIAGNOSIS — E1169 Type 2 diabetes mellitus with other specified complication: Secondary | ICD-10-CM

## 2024-02-20 DIAGNOSIS — Z0000 Encounter for general adult medical examination without abnormal findings: Secondary | ICD-10-CM

## 2024-02-20 DIAGNOSIS — N1831 Chronic kidney disease, stage 3a: Secondary | ICD-10-CM

## 2024-02-20 LAB — BAYER DCA HB A1C WAIVED: HB A1C (BAYER DCA - WAIVED): 6.1 % — ABNORMAL HIGH (ref 4.8–5.6)

## 2024-02-20 LAB — LIPID PANEL

## 2024-02-20 NOTE — Progress Notes (Signed)
 BP 129/74   Pulse 67   Ht 5' 6 (1.676 m)   Wt 180 lb (81.6 kg)   SpO2 98%   BMI 29.05 kg/m    Subjective:   Patient ID: Shawn Cline, male    DOB: [birth year], 73 y.o.   MRN: [id number]  HPI: Shawn Cline is a 73 y.o. male presenting on 02/20/2024 for Medical Management of Chronic Issues, Chronic Kidney Disease, Hyperlipidemia, and Diabetes   Discussed the use of AI scribe software for clinical note transcription with the patient, who gave verbal consent to proceed.  History of Present Illness   Shawn Cline is a 73 year old male who presents for a routine follow-up.  Glycemic control - Morning blood sugar measured at 134-135 mg/dL today, attributed to a full meal last night - Typical morning blood sugar averages around 120 mg/dL - Checks blood sugar once daily  Gastroesophageal reflux disease - Continues omeprazole therapy - No current symptoms or issues with reflux  Hyperlipidemia - Continues simvastatin  therapy for cholesterol management - No adverse effects reported  Mantle cell lymphoma surveillance - CT scan and full blood work performed in August showed no evidence of lymphoma - Scheduled for blood work every six months and CT scans every year and a half  Chronic kidney disease monitoring - Follow-up with nephrology recommended in one year  Pulmonary evaluation - No further pulmonology follow-up indicated  Peripheral neuropathy - Persistent neuropathy described as severe - No current B12 supplementation, open to trial          Relevant past medical, surgical, family and social history reviewed and updated as indicated. Interim medical history since our last visit reviewed. Allergies and medications reviewed and updated.  Review of Systems  Constitutional:  Negative for chills and fever.  Eyes:  Negative for visual disturbance.  Respiratory:  Negative for shortness of breath and wheezing.   Cardiovascular:  Negative for chest pain and  leg swelling.  Musculoskeletal:  Negative for back pain and gait problem.  Skin:  Negative for rash.  Neurological:  Negative for dizziness and light-headedness.  All other systems reviewed and are negative.   Per HPI unless specifically indicated above   Allergies as of 02/20/2024   No Known Allergies      Medication List        Accurate as of February 20, 2024 10:12 AM. If you have any questions, ask your nurse or doctor.          aspirin EC 81 MG tablet Take 81 mg by mouth daily.   cholecalciferol 25 MCG (1000 UNIT) tablet Commonly known as: VITAMIN D3 Take 1 tablet (1,000 Units total) by mouth daily.   fluticasone  50 MCG/ACT nasal spray Commonly known as: FLONASE  PLACE 1 SPRAY INTO BOTH NOSTRILS 2 (TWO) TIMES DAILY AS NEEDED FOR ALLERGIES OR RHINITIS.   loratadine 10 MG tablet Commonly known as: CLARITIN Take 10 mg by mouth daily.   omeprazole 20 MG tablet Commonly known as: PRILOSEC OTC Take 20 mg by mouth daily.   OneTouch Delica Plus Lancet33G Misc Test BS TID R73.9   OneTouch Verio Flex System w/Device Kit Test BS TID R73.9   OneTouch Verio test strip Generic drug: glucose blood Test BS TID R73.9   simvastatin  40 MG tablet Commonly known as: ZOCOR  Take 1 tablet (40 mg total) by mouth every other day.         Objective:   BP 129/74   Pulse 67   Ht 5'  6 (1.676 m)   Wt 180 lb (81.6 kg)   SpO2 98%   BMI 29.05 kg/m   Wt Readings from Last 3 Encounters:  02/20/24 180 lb (81.6 kg)  11/15/23 178 lb (80.7 kg)  08/15/23 175 lb (79.4 kg)    Physical Exam Vitals and nursing note reviewed.  Constitutional:      General: He is not in acute distress.    Appearance: He is well-developed. He is not diaphoretic.  Eyes:     General: No scleral icterus.    Conjunctiva/sclera: Conjunctivae normal.  Neck:     Thyroid : No thyromegaly.  Cardiovascular:     Rate and Rhythm: Normal rate and regular rhythm.     Heart sounds: Normal heart sounds. No  murmur heard. Pulmonary:     Effort: Pulmonary effort is normal. No respiratory distress.     Breath sounds: Normal breath sounds. No wheezing.  Musculoskeletal:        General: No swelling. Normal range of motion.     Cervical back: Neck supple.  Lymphadenopathy:     Cervical: No cervical adenopathy.  Skin:    General: Skin is warm and dry.     Findings: No rash.  Neurological:     Mental Status: He is alert and oriented to person, place, and time.     Coordination: Coordination normal.  Psychiatric:        Behavior: Behavior normal.       VITALS: BP- 129/74 NECK: Thyroid  without nodules. CHEST: Lungs clear to auscultation bilaterally. CARDIOVASCULAR: Heart regular rate and rhythm. ABDOMEN: No costovertebral angle tenderness. EXTREMITIES: No edema, good pulses in extremities.         Assessment & Plan:   Problem List Items Addressed This Visit       Endocrine   Type 2 diabetes mellitus with other specified complication (HCC)   Relevant Orders   CBC with Differential/Platelet   CMP14+EGFR   Lipid panel   PSA, total and free   TSH   Bayer DCA Hb A1c Waived     Genitourinary   CKD (chronic kidney disease) stage 3, GFR 30-59 ml/min (HCC) - Primary   Relevant Orders   CBC with Differential/Platelet   CMP14+EGFR   Lipid panel   PSA, total and free   TSH   Bayer DCA Hb A1c Waived     Other   Hyperlipidemia   Relevant Orders   CBC with Differential/Platelet   CMP14+EGFR   Lipid panel   PSA, total and free   TSH   Bayer DCA Hb A1c Waived   Other Visit Diagnoses       Mantle cell lymphoma, lymph nodes of multiple sites (HCC)               Mantle Cell Lymphoma In remission with no signs on CT. Aware of recurrence risk and treatment advancements. Initial prognosis improved with recent advancements. - Continue blood work every six months.  Peripheral Neuropathy B12 supplementation suggested, efficacy uncertain. Aware of research into targeted  therapies. - Initiate vitamin B12 supplementation.  Type 2 Diabetes Mellitus Blood sugar generally well-controlled, recent readings slightly elevated due to diet. Regular monitoring once a day. - Continue current diabetes management and monitoring. - A1c 6.1, looks good  Gastroesophageal Reflux Disease (GERD) Managed with omeprazole, reports doing well. - Continue omeprazole.  Hyperlipidemia Managed with simvastatin , no issues reported. Lipid panel results pending. - Continue simvastatin . - Review lipid panel results when available.  General Health Maintenance Blood  pressure well-controlled. Heart rate and oxygen  levels normal. Positive follow-ups with specialists, no immediate concerns. - Continue regular monitoring of blood pressure and general health parameters.          Follow up plan: Return in about 3 months (around 05/22/2024), or if symptoms worsen or fail to improve, for Diabetes .  Counseling provided for all of the vaccine components Orders Placed This Encounter  Procedures   CBC with Differential/Platelet   CMP14+EGFR   Lipid panel   PSA, total and free   TSH   Bayer DCA Hb A1c Waived    Fonda Levins, MD [facility] 02/20/2024, 10:12 AM

## 2024-02-20 NOTE — Progress Notes (Signed)
 Subjective:   Shawn Cline is a 73 y.o. male who presents for a Medicare Annual Wellness Visit.  I connected with  Debby Gambler on 02/20/24 by a audio enabled telemedicine application and verified that I am speaking with the correct person using two identifiers.  Patient Location: Home  Provider Location: Home Office  I discussed the limitations of evaluation and management by telemedicine. The patient expressed understanding and agreed to proceed.   Allergies (verified) Patient has no known allergies.   History: Past Medical History:  Diagnosis Date   Cancer [facility]) March 2020   Autologous Stem cell transplant - 10/20   Chronic kidney disease November 2019   Hypercholesteremia    Past Surgical History:  Procedure Laterality Date   HEMORRHOID SURGERY     Family History  Problem Relation Age of Onset   Atrial fibrillation Mother    Dementia Mother    Hearing loss Mother    Cancer Father        lung   COPD Father    Social History   Occupational History   Occupation: retired  Tobacco Use   Smoking status: Former    Current packs/day: 0.00    Average packs/day: 1 pack/day for 24.0 years (24.0 ttl pk-yrs)    Types: Cigarettes    Quit date: 12/03/1991    Years since quitting: 32.2   Smokeless tobacco: Never  Vaping Use   Vaping status: Never Used  Substance and Sexual Activity   Alcohol use: No   Drug use: No   Sexual activity: Yes    Birth control/protection: Post-menopausal   Tobacco Counseling Counseling given: Yes  SDOH Screenings   Food Insecurity: No Food Insecurity (02/16/2024)  Housing: Low Risk  (02/16/2024)  Transportation Needs: No Transportation Needs (02/16/2024)  Utilities: Not At Risk (02/18/2023)  Alcohol Screen: Low Risk  (02/18/2023)  Depression (PHQ2-9): Low Risk  (02/20/2024)  Financial Resource Strain: Low Risk  (02/16/2024)  Physical Activity: Insufficiently Active (02/16/2024)  Social Connections: Socially Integrated (02/16/2024)   Stress: No Stress Concern Present (02/16/2024)  Tobacco Use: Medium Risk (02/20/2024)  Health Literacy: Adequate Health Literacy (02/18/2023)   Depression Screen    02/20/2024    3:31 PM 02/20/2024    9:54 AM 11/15/2023    8:43 AM 08/15/2023    9:37 AM 05/03/2023    8:08 AM 02/18/2023    8:06 AM 01/28/2023    8:04 AM  PHQ 2/9 Scores  PHQ - 2 Score 0 0 0 0 0 0 0  PHQ- 9 Score 0     0       Data saved with a previous flowsheet row definition      Goals Addressed             This Visit's Progress    Exercise 150 min/wk Moderate Activity   On track      Visit info / Clinical Intake: Medicare Wellness Visit Type:: Subsequent Annual Wellness Visit Medicare Wellness Visit Mode:: Telephone If telephone:: video declined If telephone or video:: vitals recorded from last visit Interpreter Needed?: No Pre-visit prep was completed: yes AWV questionnaire completed by patient prior to visit?: yes Date:: 02/16/24 Living arrangements:: lives with spouse/significant other Patient's Overall Health Status Rating: very good Typical amount of pain: none Does pain affect daily life?: no Are you currently prescribed opioids?: no  Dietary Habits and Nutritional Risks How many meals a day?: 2 Eats fruit and vegetables daily?: yes Most meals are obtained by: preparing own meals  Diabetic:: no  Functional Status Activities of Daily Living (to include ambulation/medication): (Patient-Rptd) Independent Ambulation: (Patient-Rptd) Independent Medication Administration: Independent Home Management: (Patient-Rptd) Independent Manage your own finances?: yes Primary transportation is: driving Concerns about hearing?: no  Fall Screening Falls in the past year?: 0 Number of falls in past year: 0 Was there an injury with Fall?: 0 Fall Risk Category Calculator: 0 Patient Fall Risk Level: Low Fall Risk  Fall Risk Patient at Risk for Falls Due to: No Fall Risks Fall risk Follow up: Falls  evaluation completed  Home and Transportation Safety: All rugs have non-skid backing?: yes All stairs or steps have railings?: (!) no Grab bars in the bathtub or shower?: (!) no Have non-skid surface in bathtub or shower?: yes Good home lighting?: yes Regular seat belt use?: yes Hospital stays in the last year:: no  Cognitive Assessment Difficulty concentrating, remembering, or making decisions? : yes Will 6CIT or Mini Cog be Completed: yes What year is it?: 0 points What month is it?: 0 points Give patient an address phrase to remember (5 components): [address] [place], [place] About what time is it?: 0 points Count backwards from 20 to 1: 0 points Say the months of the year in reverse: 0 points Repeat the address phrase from earlier: 0 points 6 CIT Score: 0 points  Advance Directives (For Healthcare) Does Patient Have a Medical Advance Directive?: Yes Type of Advance Directive: Healthcare Power of [place]; Living will Would patient like information on creating a medical advance directive?: -- (pt aware info is at pcp's office if needed)  Reviewed/Updated  Reviewed/Updated: All; Medical History; Surgical History; Family History; Medications; Allergies; Care Teams; Patient Goals        Objective:    Today's Vitals   02/20/24 1520  BP: 113/73  Pulse: 60  Weight: 178 lb (80.7 kg)  Height: 5' 6 (1.676 m)   Body mass index is 28.73 kg/m.  Current Medications (verified) Outpatient Encounter Medications as of 02/20/2024  Medication Sig   aspirin EC 81 MG tablet Take 81 mg by mouth daily.   Blood Glucose Monitoring Suppl (ONETOUCH VERIO FLEX SYSTEM) w/Device KIT Test BS TID R73.9   cholecalciferol (VITAMIN D ) 1000 units tablet Take 1 tablet (1,000 Units total) by mouth daily.   fluticasone  (FLONASE ) 50 MCG/ACT nasal spray PLACE 1 SPRAY INTO BOTH NOSTRILS 2 (TWO) TIMES DAILY AS NEEDED FOR ALLERGIES OR RHINITIS.   glucose blood (ONETOUCH VERIO) test strip Test BS TID R73.9    Lancets (ONETOUCH DELICA PLUS LANCET33G) MISC Test BS TID R73.9   loratadine (CLARITIN) 10 MG tablet Take 10 mg by mouth daily.   omeprazole (PRILOSEC OTC) 20 MG tablet Take 20 mg by mouth daily.   simvastatin  (ZOCOR ) 40 MG tablet Take 1 tablet (40 mg total) by mouth every other day.   No facility-administered encounter medications on file as of 02/20/2024.   Hearing/Vision screen Hearing Screening - Comments:: Pt denies hearing dif Vision Screening - Comments:: Pt wear glasses/pt goes to Dr. Edith, in [place], [place]/last ov 09/2023 Immunizations and Health Maintenance Health Maintenance  Topic Date Due   OPHTHALMOLOGY EXAM  06/29/2017   COVID-19 Vaccine (4 - 2025-26 season) 03/07/2024 (Originally 12/16/2023)   Diabetic kidney evaluation - Urine ACR  08/13/2024   FOOT EXAM  08/14/2024   HEMOGLOBIN A1C  08/19/2024   Diabetic kidney evaluation - eGFR measurement  11/14/2024   Medicare Annual Wellness (AWV)  02/19/2025   Colonoscopy  01/18/2029   DTaP/Tdap/Td (6 -  Tdap) 01/26/2030   Pneumococcal Vaccine: 50+ Years  Completed   Influenza Vaccine  Completed   Hepatitis C Screening  Completed   Zoster Vaccines- Shingrix   Completed   Meningococcal B Vaccine  Aged Out   Hepatitis B Vaccines 19-59 Average Risk  Discontinued        Assessment/Plan:  This is a routine wellness examination for [facility].  Patient Care Team: Dettinger, Fonda LABOR, MD as PCP - General (Family Medicine)  I have personally reviewed and noted the following in the patient's chart:   Medical and social history Use of alcohol, tobacco or illicit drugs  Current medications and supplements including opioid prescriptions. Functional ability and status Nutritional status Physical activity Advanced directives List of other physicians Hospitalizations, surgeries, and ER visits in previous 12 months Vitals Screenings to include cognitive, depression, and falls Referrals and appointments  No orders of the defined  types were placed in this encounter.  In addition, I have reviewed and discussed with patient certain preventive protocols, quality metrics, and best practice recommendations. A written personalized care plan for preventive services as well as general preventive health recommendations were provided to patient.   Keelyn Fjelstad, CMA   02/20/2024   Return in 1 year (on 02/19/2025).  After Visit Summary: (MyChart) Due to this being a telephonic visit, the after visit summary with patients personalized plan was offered to patient via MyChart   Nurse Notes: pt is aware due Diabetic eye Exam--will request for report to be fax to office.

## 2024-02-21 LAB — CBC WITH DIFFERENTIAL/PLATELET
Basophils Absolute: 0 x10E3/uL (ref 0.0–0.2)
Basos: 1 %
EOS (ABSOLUTE): 0.2 x10E3/uL (ref 0.0–0.4)
Eos: 4 %
Hematocrit: 42.7 % (ref 37.5–51.0)
Hemoglobin: 14.3 g/dL (ref 13.0–17.7)
Immature Grans (Abs): 0 x10E3/uL (ref 0.0–0.1)
Immature Granulocytes: 0 %
Lymphocytes Absolute: 1.3 x10E3/uL (ref 0.7–3.1)
Lymphs: 23 %
MCH: 32.1 pg (ref 26.6–33.0)
MCHC: 33.5 g/dL (ref 31.5–35.7)
MCV: 96 fL (ref 79–97)
Monocytes Absolute: 0.7 x10E3/uL (ref 0.1–0.9)
Monocytes: 12 %
Neutrophils Absolute: 3.2 x10E3/uL (ref 1.4–7.0)
Neutrophils: 60 %
Platelets: 157 x10E3/uL (ref 150–450)
RBC: 4.45 x10E6/uL (ref 4.14–5.80)
RDW: 12.6 % (ref 11.6–15.4)
WBC: 5.4 x10E3/uL (ref 3.4–10.8)

## 2024-02-21 LAB — LIPID PANEL
Cholesterol, Total: 167 mg/dL (ref 100–199)
HDL: 44 mg/dL (ref >39)
LDL CALC COMMENT:: 3.8 ratio (ref 0.0–5.0)
LDL Chol Calc (NIH): 103 mg/dL — AB (ref 0–99)
Triglycerides: 110 mg/dL (ref 0–149)
VLDL Cholesterol Cal: 20 mg/dL (ref 5–40)

## 2024-02-21 LAB — PSA, TOTAL AND FREE
PSA, Free Pct: 21.7 %
PSA, Free: 0.26 ng/mL
Prostate Specific Ag, Serum: 1.2 ng/mL (ref 0.0–4.0)

## 2024-02-21 LAB — CMP14+EGFR
ALT: 25 IU/L (ref 0–44)
AST: 20 IU/L (ref 0–40)
Albumin: 4.6 g/dL (ref 3.8–4.8)
Alkaline Phosphatase: 71 IU/L (ref 47–123)
BUN/Creatinine Ratio: 14 (ref 10–24)
BUN: 21 mg/dL (ref 8–27)
Bilirubin Total: 0.5 mg/dL (ref 0.0–1.2)
CO2: 25 mmol/L (ref 20–29)
Calcium: 9.7 mg/dL (ref 8.6–10.2)
Chloride: 101 mmol/L (ref 96–106)
Creatinine, Ser: 1.5 mg/dL — AB (ref 0.76–1.27)
Globulin, Total: 2.1 g/dL (ref 1.5–4.5)
Glucose: 110 mg/dL — AB (ref 70–99)
Potassium: 4.1 mmol/L (ref 3.5–5.2)
Sodium: 141 mmol/L (ref 134–144)
Total Protein: 6.7 g/dL (ref 6.0–8.5)
eGFR: 49 mL/min/1.73 — AB (ref >59)

## 2024-02-21 LAB — TSH: TSH: 3.51 u[IU]/mL (ref 0.450–4.500)

## 2024-02-27 ENCOUNTER — Ambulatory Visit: Payer: Self-pay | Admitting: Family Medicine

## 2024-03-05 DIAGNOSIS — L299 Pruritus, unspecified: Secondary | ICD-10-CM | POA: Diagnosis not present

## 2024-03-05 DIAGNOSIS — H6123 Impacted cerumen, bilateral: Secondary | ICD-10-CM | POA: Diagnosis not present

## 2024-05-15 ENCOUNTER — Encounter: Payer: Self-pay | Admitting: Family Medicine

## 2024-05-15 ENCOUNTER — Ambulatory Visit (INDEPENDENT_AMBULATORY_CARE_PROVIDER_SITE_OTHER): Admitting: Family Medicine

## 2024-05-15 VITALS — BP 133/73 | HR 70 | Ht 66.0 in | Wt 181.0 lb

## 2024-05-15 DIAGNOSIS — Z9484 Stem cells transplant status: Secondary | ICD-10-CM

## 2024-05-15 DIAGNOSIS — E1169 Type 2 diabetes mellitus with other specified complication: Secondary | ICD-10-CM

## 2024-05-15 DIAGNOSIS — E782 Mixed hyperlipidemia: Secondary | ICD-10-CM | POA: Diagnosis not present

## 2024-05-15 DIAGNOSIS — C8310 Mantle cell lymphoma, unspecified site: Secondary | ICD-10-CM

## 2024-05-15 DIAGNOSIS — E785 Hyperlipidemia, unspecified: Secondary | ICD-10-CM

## 2024-05-15 DIAGNOSIS — N1831 Chronic kidney disease, stage 3a: Secondary | ICD-10-CM | POA: Diagnosis not present

## 2024-05-15 LAB — CBC WITH DIFFERENTIAL/PLATELET
Basophils Absolute: 0 10*3/uL (ref 0.0–0.2)
Basos: 0 %
EOS (ABSOLUTE): 0.1 10*3/uL (ref 0.0–0.4)
Eos: 2 %
Hematocrit: 39.6 % (ref 37.5–51.0)
Hemoglobin: 13.4 g/dL (ref 13.0–17.7)
Immature Grans (Abs): 0 10*3/uL (ref 0.0–0.1)
Immature Granulocytes: 0 %
Lymphocytes Absolute: 1.5 10*3/uL (ref 0.7–3.1)
Lymphs: 33 %
MCH: 31.5 pg (ref 26.6–33.0)
MCHC: 33.8 g/dL (ref 31.5–35.7)
MCV: 93 fL (ref 79–97)
Monocytes Absolute: 0.7 10*3/uL (ref 0.1–0.9)
Monocytes: 16 %
Neutrophils Absolute: 2.2 10*3/uL (ref 1.4–7.0)
Neutrophils: 49 %
Platelets: 148 10*3/uL — ABNORMAL LOW (ref 150–450)
RBC: 4.26 x10E6/uL (ref 4.14–5.80)
RDW: 12.6 % (ref 11.6–15.4)
WBC: 4.6 10*3/uL (ref 3.4–10.8)

## 2024-05-15 LAB — CMP14+EGFR
ALT: 24 [IU]/L (ref 0–44)
AST: 24 [IU]/L (ref 0–40)
Albumin: 4.4 g/dL (ref 3.8–4.8)
Alkaline Phosphatase: 62 [IU]/L (ref 47–123)
BUN/Creatinine Ratio: 15 (ref 10–24)
BUN: 21 mg/dL (ref 8–27)
Bilirubin Total: 0.4 mg/dL (ref 0.0–1.2)
CO2: 24 mmol/L (ref 20–29)
Calcium: 9.6 mg/dL (ref 8.6–10.2)
Chloride: 103 mmol/L (ref 96–106)
Creatinine, Ser: 1.43 mg/dL — ABNORMAL HIGH (ref 0.76–1.27)
Globulin, Total: 2.2 g/dL (ref 1.5–4.5)
Glucose: 107 mg/dL — ABNORMAL HIGH (ref 70–99)
Potassium: 4 mmol/L (ref 3.5–5.2)
Sodium: 140 mmol/L (ref 134–144)
Total Protein: 6.6 g/dL (ref 6.0–8.5)
eGFR: 52 mL/min/{1.73_m2} — ABNORMAL LOW

## 2024-05-15 LAB — BAYER DCA HB A1C WAIVED: HB A1C (BAYER DCA - WAIVED): 5.6 % (ref 4.8–5.6)

## 2024-05-15 LAB — LIPID PANEL
Chol/HDL Ratio: 3.2 ratio (ref 0.0–5.0)
Cholesterol, Total: 142 mg/dL (ref 100–199)
HDL: 44 mg/dL
LDL Chol Calc (NIH): 83 mg/dL (ref 0–99)
Triglycerides: 79 mg/dL (ref 0–149)
VLDL Cholesterol Cal: 15 mg/dL (ref 5–40)

## 2024-05-15 MED ORDER — ACCU-CHEK GUIDE W/DEVICE KIT: 1.0000 | PACK | Freq: Two times a day (BID) | 1 refills | Status: AC

## 2024-05-15 MED ORDER — ACCU-CHEK GUIDE TEST VI STRP: 1.0000 | ORAL_STRIP | Freq: Two times a day (BID) | 3 refills | Status: AC

## 2024-05-15 MED ORDER — ACCU-CHEK FASTCLIX LANCET KIT: 1.0000 | PACK | Freq: Two times a day (BID) | 1 refills | Status: AC

## 2024-05-15 NOTE — Progress Notes (Signed)
 "  BP 133/73   Pulse 70   Ht 5' 6 (1.676 m)   Wt 181 lb (82.1 kg)   SpO2 97%   BMI 29.21 kg/m    Subjective:   Patient ID: Shawn Cline, male    DOB: 11/24/1950, 74 y.o.   MRN: [id number]  HPI: Shawn Cline is a 74 y.o. male presenting on 05/15/2024 for Medical Management of Chronic Issues, Chronic Kidney Disease, Diabetes, and Hyperlipidemia   Discussed the use of AI scribe software for clinical note transcription with the patient, who gave verbal consent to proceed.  History of Present Illness   Shawn Cline is a 74 year old male who presents for a routine follow-up.  Glycemic control - Diabetes mellitus with blood glucose levels typically ranging from 120 to 136 mg/dL - Recently changed glucose meter and strips; current brand unknown - Due for blood work to assess hemoglobin A1c  Blood pressure monitoring - Blood pressure measured at 133/73 mmHg - Monitoring blood pressure at home  Hyperlipidemia management - Continues simvastatin  therapy - No reported adverse effects  Gastroesophageal reflux disease - On omeprazole therapy - GERD symptoms well controlled - Able to eat without experiencing heartburn  Mantle cell lymphoma surveillance - Oncologist indicates disease is in remission - Oncologist visits every six months - CT scans scheduled every year and a half - Recent blood work shows no evidence of disease progression          Relevant past medical, surgical, family and social history reviewed and updated as indicated. Interim medical history since our last visit reviewed. Allergies and medications reviewed and updated.  Review of Systems  Constitutional:  Negative for chills and fever.  Eyes:  Negative for discharge.  Respiratory:  Negative for shortness of breath and wheezing.   Cardiovascular:  Negative for chest pain and leg swelling.  Musculoskeletal:  Negative for back pain and gait problem.  Skin:  Negative for rash.  Neurological:   Negative for dizziness and light-headedness.  All other systems reviewed and are negative.   Per HPI unless specifically indicated above   Allergies as of 05/15/2024   No Known Allergies      Medication List        Accurate as of May 15, 2024 10:50 AM. If you have any questions, ask your nurse or doctor.          Accu-Chek FastClix Lancet Kit 1 each by Does not apply route 2 (two) times daily. Started by: Fonda Levins, MD   Accu-Chek Guide Test test strip Generic drug: glucose blood 1 each by Other route 2 (two) times daily. Use as instructed What changed:  how much to take how to take this when to take this additional instructions Changed by: Fonda Levins, MD   Accu-Chek Guide w/Device Kit 1 each by Does not apply route 2 (two) times daily. What changed:  how much to take how to take this when to take this additional instructions Changed by: Fonda Levins, MD   aspirin EC 81 MG tablet Take 81 mg by mouth daily.   cholecalciferol 25 MCG (1000 UNIT) tablet Commonly known as: VITAMIN D3 Take 1 tablet (1,000 Units total) by mouth daily.   fluticasone  50 MCG/ACT nasal spray Commonly known as: FLONASE  PLACE 1 SPRAY INTO BOTH NOSTRILS 2 (TWO) TIMES DAILY AS NEEDED FOR ALLERGIES OR RHINITIS.   loratadine 10 MG tablet Commonly known as: CLARITIN Take 10 mg by mouth daily.   omeprazole 20 MG tablet Commonly  known as: PRILOSEC OTC Take 20 mg by mouth daily.   OneTouch Delica Plus Lancet33G Misc Test BS TID R73.9   simvastatin  40 MG tablet Commonly known as: ZOCOR  Take 1 tablet (40 mg total) by mouth every other day.         Objective:   BP 133/73   Pulse 70   Ht 5' 6 (1.676 m)   Wt 181 lb (82.1 kg)   SpO2 97%   BMI 29.21 kg/m   Wt Readings from Last 3 Encounters:  05/15/24 181 lb (82.1 kg)  02/20/24 178 lb (80.7 kg)  02/20/24 180 lb (81.6 kg)    Physical Exam Physical Exam   VITALS: BP- 133/73 NECK: Thyroid  without  lumps. CHEST: Lungs clear to auscultation. CARDIOVASCULAR: Regular heart sounds. ABDOMEN: No kidney swelling or tenderness.       Results for orders placed or performed in visit on 04/29/24  OPHTHALMOLOGY REPORT-SCANNED   Collection Time: 08/19/23  8:39 AM  Result Value Ref Range   A Comment      Assessment & Plan:   Problem List Items Addressed This Visit       Endocrine   Hyperlipidemia associated with type 2 diabetes mellitus (HCC)   Type 2 diabetes mellitus with other specified complication (HCC)   Relevant Orders   Bayer DCA Hb A1c Waived   CBC with Differential/Platelet   CMP14+EGFR   Lipid panel     Genitourinary   CKD (chronic kidney disease) stage 3, GFR 30-59 ml/min (HCC) - Primary   Relevant Orders   Bayer DCA Hb A1c Waived   CBC with Differential/Platelet   CMP14+EGFR   Lipid panel     Other   Mantle cell lymphoma (HCC)   H/O autologous stem cell transplant (HCC)       Type 2 diabetes mellitus with other specified complication Blood sugars well-controlled. Current glucose meter outdated. - Sent prescription for new Accu-Check glucose meter and strips. - Checked A1c with blood work and will call with results.  Stage 3a chronic kidney disease No new symptoms or complications reported.  Mantle cell lymphoma in remission In remission with no regrowth. CT scans reduced to every 18 months. Blood work stable. - Continue follow-up with oncologist every six months.  Mixed hyperlipidemia Continues on simvastatin  with no issues.  Gastroesophageal reflux disease Symptoms well-controlled with omeprazole. - Continue omeprazole.          Follow up plan: Return in about 3 months (around 08/13/2024), or if symptoms worsen or fail to improve, for Diabetes and hyperlipidemia.  Counseling provided for all of the vaccine components Orders Placed This Encounter  Procedures   Bayer DCA Hb A1c Waived   CBC with Differential/Platelet   CMP14+EGFR   Lipid  panel    Fonda Levins, MD [facility] 05/15/2024, 10:50 AM     "

## 2024-05-18 ENCOUNTER — Ambulatory Visit: Payer: Self-pay | Admitting: Family Medicine

## 2024-05-22 ENCOUNTER — Ambulatory Visit: Admitting: Family Medicine

## 2024-08-19 ENCOUNTER — Ambulatory Visit: Admitting: Family Medicine

## 2025-02-23 ENCOUNTER — Ambulatory Visit
